# Patient Record
Sex: Male | Born: 1935
Health system: Southern US, Community
[De-identification: ages and names within clinical notes are randomized; demographics above are authoritative.]

## PROBLEM LIST (undated history)

## (undated) DIAGNOSIS — I10 Essential (primary) hypertension: Secondary | ICD-10-CM

## (undated) DIAGNOSIS — Z950 Presence of cardiac pacemaker: Secondary | ICD-10-CM

## (undated) DIAGNOSIS — G4730 Sleep apnea, unspecified: Secondary | ICD-10-CM

## (undated) DIAGNOSIS — M1990 Unspecified osteoarthritis, unspecified site: Secondary | ICD-10-CM

## (undated) DIAGNOSIS — Z22322 Carrier or suspected carrier of Methicillin resistant Staphylococcus aureus: Secondary | ICD-10-CM

## (undated) DIAGNOSIS — R251 Tremor, unspecified: Secondary | ICD-10-CM

## (undated) DIAGNOSIS — J449 Chronic obstructive pulmonary disease, unspecified: Secondary | ICD-10-CM

## (undated) DIAGNOSIS — I495 Sick sinus syndrome: Secondary | ICD-10-CM

## (undated) DIAGNOSIS — C801 Malignant (primary) neoplasm, unspecified: Secondary | ICD-10-CM

## (undated) DIAGNOSIS — R490 Dysphonia: Secondary | ICD-10-CM

## (undated) HISTORY — PX: INSERT / REPLACE / REMOVE PACEMAKER: SUR710

## (undated) HISTORY — PX: CATARACT EXTRACTION: SUR2

## (undated) HISTORY — PX: POSTERIOR LAMINECTOMY / DECOMPRESSION LUMBAR SPINE: SUR740

---

## 2004-01-16 ENCOUNTER — Other Ambulatory Visit: Payer: Self-pay

## 2004-07-28 ENCOUNTER — Ambulatory Visit: Payer: Self-pay | Admitting: Physician Assistant

## 2004-08-13 ENCOUNTER — Other Ambulatory Visit: Payer: Self-pay

## 2004-08-13 ENCOUNTER — Observation Stay: Payer: Self-pay | Admitting: Unknown Physician Specialty

## 2004-09-16 ENCOUNTER — Ambulatory Visit: Payer: Self-pay

## 2005-11-13 ENCOUNTER — Ambulatory Visit: Payer: Self-pay | Admitting: Internal Medicine

## 2006-01-07 ENCOUNTER — Ambulatory Visit: Payer: Self-pay | Admitting: Internal Medicine

## 2008-04-07 ENCOUNTER — Inpatient Hospital Stay: Payer: Self-pay | Admitting: Internal Medicine

## 2008-05-15 ENCOUNTER — Encounter: Payer: Self-pay | Admitting: Orthopedic Surgery

## 2008-05-15 ENCOUNTER — Ambulatory Visit: Payer: Self-pay | Admitting: Internal Medicine

## 2008-05-22 ENCOUNTER — Encounter: Payer: Self-pay | Admitting: Orthopedic Surgery

## 2008-06-22 ENCOUNTER — Encounter: Payer: Self-pay | Admitting: Orthopedic Surgery

## 2008-06-25 ENCOUNTER — Ambulatory Visit: Payer: Self-pay | Admitting: Orthopedic Surgery

## 2008-06-28 ENCOUNTER — Ambulatory Visit: Payer: Self-pay | Admitting: Sports Medicine

## 2008-06-28 DIAGNOSIS — M7500 Adhesive capsulitis of unspecified shoulder: Secondary | ICD-10-CM | POA: Insufficient documentation

## 2008-06-28 DIAGNOSIS — M67919 Unspecified disorder of synovium and tendon, unspecified shoulder: Secondary | ICD-10-CM | POA: Insufficient documentation

## 2008-06-28 DIAGNOSIS — M719 Bursopathy, unspecified: Secondary | ICD-10-CM

## 2008-06-29 ENCOUNTER — Encounter (INDEPENDENT_AMBULATORY_CARE_PROVIDER_SITE_OTHER): Payer: Self-pay | Admitting: *Deleted

## 2008-07-10 ENCOUNTER — Ambulatory Visit: Payer: Self-pay | Admitting: Orthopedic Surgery

## 2008-07-23 ENCOUNTER — Encounter: Payer: Self-pay | Admitting: Orthopedic Surgery

## 2008-08-15 ENCOUNTER — Ambulatory Visit: Payer: Self-pay | Admitting: Pain Medicine

## 2008-08-20 ENCOUNTER — Encounter: Payer: Self-pay | Admitting: Orthopedic Surgery

## 2009-02-08 ENCOUNTER — Inpatient Hospital Stay: Payer: Self-pay | Admitting: Internal Medicine

## 2009-03-07 ENCOUNTER — Ambulatory Visit: Payer: Self-pay | Admitting: Unknown Physician Specialty

## 2009-03-15 ENCOUNTER — Ambulatory Visit: Payer: Self-pay | Admitting: Unknown Physician Specialty

## 2010-04-03 ENCOUNTER — Ambulatory Visit: Payer: Self-pay

## 2011-02-21 DEATH — deceased

## 2011-11-10 ENCOUNTER — Ambulatory Visit: Payer: Self-pay | Admitting: Internal Medicine

## 2012-05-18 ENCOUNTER — Ambulatory Visit: Payer: Self-pay | Admitting: Internal Medicine

## 2013-04-10 ENCOUNTER — Ambulatory Visit: Payer: Self-pay | Admitting: Internal Medicine

## 2013-10-01 DIAGNOSIS — G4733 Obstructive sleep apnea (adult) (pediatric): Secondary | ICD-10-CM | POA: Insufficient documentation

## 2013-11-06 DIAGNOSIS — I2510 Atherosclerotic heart disease of native coronary artery without angina pectoris: Secondary | ICD-10-CM | POA: Insufficient documentation

## 2013-11-06 DIAGNOSIS — I10 Essential (primary) hypertension: Secondary | ICD-10-CM | POA: Diagnosis present

## 2013-11-09 ENCOUNTER — Ambulatory Visit: Payer: Self-pay | Admitting: Internal Medicine

## 2013-11-23 ENCOUNTER — Ambulatory Visit: Payer: Self-pay | Admitting: Internal Medicine

## 2014-01-22 DIAGNOSIS — E782 Mixed hyperlipidemia: Secondary | ICD-10-CM | POA: Insufficient documentation

## 2014-02-08 DIAGNOSIS — G250 Essential tremor: Secondary | ICD-10-CM | POA: Diagnosis present

## 2014-06-19 ENCOUNTER — Ambulatory Visit: Payer: Self-pay | Admitting: Internal Medicine

## 2015-05-02 DIAGNOSIS — N1832 Chronic kidney disease, stage 3b: Secondary | ICD-10-CM | POA: Diagnosis present

## 2015-05-02 DIAGNOSIS — N183 Chronic kidney disease, stage 3 unspecified: Secondary | ICD-10-CM | POA: Diagnosis present

## 2015-07-25 DIAGNOSIS — X32XXXA Exposure to sunlight, initial encounter: Secondary | ICD-10-CM | POA: Diagnosis not present

## 2015-07-25 DIAGNOSIS — D0421 Carcinoma in situ of skin of right ear and external auricular canal: Secondary | ICD-10-CM | POA: Diagnosis not present

## 2015-07-25 DIAGNOSIS — D485 Neoplasm of uncertain behavior of skin: Secondary | ICD-10-CM | POA: Diagnosis not present

## 2015-07-25 DIAGNOSIS — Z85828 Personal history of other malignant neoplasm of skin: Secondary | ICD-10-CM | POA: Diagnosis not present

## 2015-07-25 DIAGNOSIS — D2261 Melanocytic nevi of right upper limb, including shoulder: Secondary | ICD-10-CM | POA: Diagnosis not present

## 2015-07-25 DIAGNOSIS — L821 Other seborrheic keratosis: Secondary | ICD-10-CM | POA: Diagnosis not present

## 2015-07-25 DIAGNOSIS — D225 Melanocytic nevi of trunk: Secondary | ICD-10-CM | POA: Diagnosis not present

## 2015-07-25 DIAGNOSIS — L570 Actinic keratosis: Secondary | ICD-10-CM | POA: Diagnosis not present

## 2015-08-22 DIAGNOSIS — D0439 Carcinoma in situ of skin of other parts of face: Secondary | ICD-10-CM | POA: Diagnosis not present

## 2015-10-11 DIAGNOSIS — M5010 Cervical disc disorder with radiculopathy, unspecified cervical region: Secondary | ICD-10-CM | POA: Diagnosis not present

## 2015-10-23 DIAGNOSIS — I2510 Atherosclerotic heart disease of native coronary artery without angina pectoris: Secondary | ICD-10-CM | POA: Diagnosis not present

## 2015-10-23 DIAGNOSIS — Z125 Encounter for screening for malignant neoplasm of prostate: Secondary | ICD-10-CM | POA: Diagnosis not present

## 2015-10-30 DIAGNOSIS — I495 Sick sinus syndrome: Secondary | ICD-10-CM | POA: Diagnosis not present

## 2015-10-30 DIAGNOSIS — J410 Simple chronic bronchitis: Secondary | ICD-10-CM | POA: Diagnosis not present

## 2015-10-30 DIAGNOSIS — I10 Essential (primary) hypertension: Secondary | ICD-10-CM | POA: Diagnosis not present

## 2015-10-30 DIAGNOSIS — J42 Unspecified chronic bronchitis: Secondary | ICD-10-CM | POA: Diagnosis not present

## 2015-10-30 DIAGNOSIS — Z0000 Encounter for general adult medical examination without abnormal findings: Secondary | ICD-10-CM | POA: Diagnosis not present

## 2015-10-30 DIAGNOSIS — D369 Benign neoplasm, unspecified site: Secondary | ICD-10-CM | POA: Diagnosis not present

## 2015-10-30 DIAGNOSIS — E782 Mixed hyperlipidemia: Secondary | ICD-10-CM | POA: Diagnosis not present

## 2015-10-30 DIAGNOSIS — I2510 Atherosclerotic heart disease of native coronary artery without angina pectoris: Secondary | ICD-10-CM | POA: Diagnosis not present

## 2015-10-30 DIAGNOSIS — N183 Chronic kidney disease, stage 3 (moderate): Secondary | ICD-10-CM | POA: Diagnosis not present

## 2015-11-07 DIAGNOSIS — I495 Sick sinus syndrome: Secondary | ICD-10-CM | POA: Diagnosis not present

## 2015-11-14 DIAGNOSIS — R490 Dysphonia: Secondary | ICD-10-CM | POA: Diagnosis not present

## 2015-11-14 DIAGNOSIS — E870 Hyperosmolality and hypernatremia: Secondary | ICD-10-CM | POA: Diagnosis not present

## 2015-11-14 DIAGNOSIS — K219 Gastro-esophageal reflux disease without esophagitis: Secondary | ICD-10-CM | POA: Diagnosis not present

## 2015-11-14 DIAGNOSIS — Z86010 Personal history of colonic polyps: Secondary | ICD-10-CM | POA: Diagnosis not present

## 2015-12-04 DIAGNOSIS — S30863A Insect bite (nonvenomous) of scrotum and testes, initial encounter: Secondary | ICD-10-CM | POA: Diagnosis not present

## 2015-12-04 DIAGNOSIS — R21 Rash and other nonspecific skin eruption: Secondary | ICD-10-CM | POA: Diagnosis not present

## 2015-12-04 DIAGNOSIS — W57XXXA Bitten or stung by nonvenomous insect and other nonvenomous arthropods, initial encounter: Secondary | ICD-10-CM | POA: Diagnosis not present

## 2016-01-14 ENCOUNTER — Encounter: Payer: Self-pay | Admitting: *Deleted

## 2016-01-15 ENCOUNTER — Ambulatory Visit: Admission: RE | Admit: 2016-01-15 | Payer: Self-pay | Source: Ambulatory Visit | Admitting: Unknown Physician Specialty

## 2016-01-15 ENCOUNTER — Encounter: Payer: Self-pay | Admitting: *Deleted

## 2016-01-15 ENCOUNTER — Ambulatory Visit: Payer: PPO | Admitting: Anesthesiology

## 2016-01-15 ENCOUNTER — Ambulatory Visit
Admission: RE | Admit: 2016-01-15 | Discharge: 2016-01-15 | Disposition: A | Discharge status: Home / Self Care | Payer: PPO | Source: Ambulatory Visit | Attending: Unknown Physician Specialty | Admitting: Unknown Physician Specialty

## 2016-01-15 ENCOUNTER — Encounter: Admission: RE | Payer: Self-pay | Source: Ambulatory Visit

## 2016-01-15 ENCOUNTER — Encounter
Admission: RE | Disposition: A | Discharge status: Home / Self Care | Payer: Self-pay | Source: Ambulatory Visit | Attending: Unknown Physician Specialty

## 2016-01-15 DIAGNOSIS — G4730 Sleep apnea, unspecified: Secondary | ICD-10-CM | POA: Diagnosis not present

## 2016-01-15 DIAGNOSIS — K5730 Diverticulosis of large intestine without perforation or abscess without bleeding: Secondary | ICD-10-CM | POA: Diagnosis not present

## 2016-01-15 DIAGNOSIS — Z1211 Encounter for screening for malignant neoplasm of colon: Secondary | ICD-10-CM | POA: Diagnosis not present

## 2016-01-15 DIAGNOSIS — R12 Heartburn: Secondary | ICD-10-CM | POA: Diagnosis not present

## 2016-01-15 DIAGNOSIS — J449 Chronic obstructive pulmonary disease, unspecified: Secondary | ICD-10-CM | POA: Diagnosis not present

## 2016-01-15 DIAGNOSIS — I10 Essential (primary) hypertension: Secondary | ICD-10-CM | POA: Insufficient documentation

## 2016-01-15 DIAGNOSIS — Z8614 Personal history of Methicillin resistant Staphylococcus aureus infection: Secondary | ICD-10-CM | POA: Diagnosis not present

## 2016-01-15 DIAGNOSIS — K3189 Other diseases of stomach and duodenum: Secondary | ICD-10-CM | POA: Diagnosis not present

## 2016-01-15 DIAGNOSIS — M1990 Unspecified osteoarthritis, unspecified site: Secondary | ICD-10-CM | POA: Diagnosis not present

## 2016-01-15 DIAGNOSIS — K229 Disease of esophagus, unspecified: Secondary | ICD-10-CM | POA: Diagnosis not present

## 2016-01-15 DIAGNOSIS — D123 Benign neoplasm of transverse colon: Secondary | ICD-10-CM | POA: Diagnosis not present

## 2016-01-15 DIAGNOSIS — Z86010 Personal history of colonic polyps: Secondary | ICD-10-CM | POA: Diagnosis not present

## 2016-01-15 DIAGNOSIS — Z79899 Other long term (current) drug therapy: Secondary | ICD-10-CM | POA: Insufficient documentation

## 2016-01-15 DIAGNOSIS — K317 Polyp of stomach and duodenum: Secondary | ICD-10-CM | POA: Diagnosis not present

## 2016-01-15 DIAGNOSIS — Z7982 Long term (current) use of aspirin: Secondary | ICD-10-CM | POA: Insufficient documentation

## 2016-01-15 DIAGNOSIS — D124 Benign neoplasm of descending colon: Secondary | ICD-10-CM | POA: Insufficient documentation

## 2016-01-15 DIAGNOSIS — K210 Gastro-esophageal reflux disease with esophagitis: Secondary | ICD-10-CM | POA: Diagnosis not present

## 2016-01-15 DIAGNOSIS — R490 Dysphonia: Secondary | ICD-10-CM | POA: Diagnosis not present

## 2016-01-15 DIAGNOSIS — K5790 Diverticulosis of intestine, part unspecified, without perforation or abscess without bleeding: Secondary | ICD-10-CM | POA: Diagnosis not present

## 2016-01-15 DIAGNOSIS — K635 Polyp of colon: Secondary | ICD-10-CM | POA: Diagnosis not present

## 2016-01-15 DIAGNOSIS — K621 Rectal polyp: Secondary | ICD-10-CM | POA: Diagnosis not present

## 2016-01-15 DIAGNOSIS — K640 First degree hemorrhoids: Secondary | ICD-10-CM | POA: Insufficient documentation

## 2016-01-15 DIAGNOSIS — Z87891 Personal history of nicotine dependence: Secondary | ICD-10-CM | POA: Diagnosis not present

## 2016-01-15 DIAGNOSIS — K648 Other hemorrhoids: Secondary | ICD-10-CM | POA: Diagnosis not present

## 2016-01-15 DIAGNOSIS — K209 Esophagitis, unspecified: Secondary | ICD-10-CM | POA: Diagnosis not present

## 2016-01-15 DIAGNOSIS — K2950 Unspecified chronic gastritis without bleeding: Secondary | ICD-10-CM | POA: Diagnosis not present

## 2016-01-15 HISTORY — DX: Sick sinus syndrome: I49.5

## 2016-01-15 HISTORY — DX: Chronic obstructive pulmonary disease, unspecified: J44.9

## 2016-01-15 HISTORY — DX: Essential (primary) hypertension: I10

## 2016-01-15 HISTORY — DX: Sleep apnea, unspecified: G47.30

## 2016-01-15 HISTORY — DX: Dysphonia: R49.0

## 2016-01-15 HISTORY — DX: Carrier or suspected carrier of methicillin resistant Staphylococcus aureus: Z22.322

## 2016-01-15 HISTORY — PX: ESOPHAGOGASTRODUODENOSCOPY (EGD) WITH PROPOFOL: SHX5813

## 2016-01-15 HISTORY — PX: COLONOSCOPY WITH PROPOFOL: SHX5780

## 2016-01-15 HISTORY — DX: Unspecified osteoarthritis, unspecified site: M19.90

## 2016-01-15 HISTORY — DX: Tremor, unspecified: R25.1

## 2016-01-15 SURGERY — COLONOSCOPY
Anesthesia: General

## 2016-01-15 SURGERY — COLONOSCOPY WITH PROPOFOL
Anesthesia: General

## 2016-01-15 MED ORDER — FENTANYL CITRATE (PF) 100 MCG/2ML IJ SOLN
INTRAMUSCULAR | Status: DC | PRN
  Administered 2016-01-15: 50 ug via INTRAVENOUS

## 2016-01-15 MED ORDER — LIDOCAINE HCL (PF) 1 % IJ SOLN
INTRAMUSCULAR | Status: AC
  Administered 2016-01-15: 0.03 mL via INTRADERMAL
  Filled 2016-01-15: qty 2

## 2016-01-15 MED ORDER — SODIUM CHLORIDE 0.9 % IV SOLN
INTRAVENOUS | Status: DC
  Administered 2016-01-15: 1000 mL via INTRAVENOUS

## 2016-01-15 MED ORDER — PROPOFOL 10 MG/ML IV BOLUS
INTRAVENOUS | Status: DC | PRN
  Administered 2016-01-15: 30 mg via INTRAVENOUS
  Administered 2016-01-15: 20 mg via INTRAVENOUS

## 2016-01-15 MED ORDER — GLYCOPYRROLATE 0.2 MG/ML IJ SOLN
INTRAMUSCULAR | Status: DC | PRN
  Administered 2016-01-15: 0.2 mg via INTRAVENOUS

## 2016-01-15 MED ORDER — PROPOFOL 500 MG/50ML IV EMUL
INTRAVENOUS | Status: DC | PRN
  Administered 2016-01-15: 100 ug/kg/min via INTRAVENOUS

## 2016-01-15 MED ORDER — LIDOCAINE HCL (PF) 2 % IJ SOLN
INTRAMUSCULAR | Status: DC | PRN
  Administered 2016-01-15: 50 mg

## 2016-01-15 MED ORDER — LIDOCAINE HCL (PF) 1 % IJ SOLN
2.0000 mL | Freq: Once | INTRAMUSCULAR | Status: AC
  Administered 2016-01-15: 0.03 mL via INTRADERMAL

## 2016-01-15 NOTE — Anesthesia Preprocedure Evaluation (Addendum)
Anesthesia Evaluation  Patient identified by MRN, date of birth, ID band Patient awake    Reviewed: Allergy & Precautions, NPO status , Patient's Chart, lab work & pertinent test results  History of Anesthesia Complications Negative for: history of anesthetic complications  Airway Mallampati: II  TM Distance: >3 FB Neck ROM: Full    Dental  (+) Lower Dentures, Upper Dentures   Pulmonary sleep apnea (does not use CPAP) , COPD, former smoker,    - rhonchi (-) wheezing      Cardiovascular hypertension, Pt. on medications (-) CAD and (-) Past MI + pacemaker  Rhythm:Regular Rate:Normal - Systolic murmurs and - Diastolic murmurs    Neuro/Psych negative psych ROS   GI/Hepatic negative GI ROS, Neg liver ROS,   Endo/Other  negative endocrine ROSneg diabetes  Renal/GU negative Renal ROS     Musculoskeletal  (+) Arthritis , Osteoarthritis,    Abdominal (+) - obese,   Peds  Hematology negative hematology ROS (+)   Anesthesia Other Findings Past Medical History: No date: Arthritis No date: COPD (chronic obstructive pulmonary disease) (* No date: Hoarseness of voice No date: Hypertension No date: MRSA (methicillin resistant staph aureus) cult* No date: Sick sinus syndrome (HCC) No date: Sleep apnea No date: Tremor   Reproductive/Obstetrics                            Anesthesia Physical Anesthesia Plan  ASA: III  Anesthesia Plan: General   Post-op Pain Management:    Induction: Intravenous  Airway Management Planned: Natural Airway  Additional Equipment:   Intra-op Plan:   Post-operative Plan:   Informed Consent: I have reviewed the patients History and Physical, chart, labs and discussed the procedure including the risks, benefits and alternatives for the proposed anesthesia with the patient or authorized representative who has indicated his/her understanding and acceptance.      Plan Discussed with: CRNA and Anesthesiologist  Anesthesia Plan Comments:        Anesthesia Quick Evaluation

## 2016-01-15 NOTE — Transfer of Care (Signed)
Immediate Anesthesia Transfer of Care Note  Patient: Darren Gonzales  Procedure(s) Performed: Procedure(s): COLONOSCOPY WITH PROPOFOL (N/A) ESOPHAGOGASTRODUODENOSCOPY (EGD) WITH PROPOFOL (N/A)  Patient Location: PACU  Anesthesia Type:General  Level of Consciousness: sedated  Airway & Oxygen Therapy: Patient Spontanous Breathing and Patient connected to nasal cannula oxygen  Post-op Assessment: Report given to RN and Post -op Vital signs reviewed and stable  Post vital signs: Reviewed and stable  Last Vitals:  Vitals:   01/15/16 0842  BP: (!) 145/99  Pulse: 82  Resp: 17  Temp: 36.2 C    Last Pain:  Vitals:   01/15/16 0842  TempSrc: Tympanic         Complications: No apparent anesthesia complications

## 2016-01-15 NOTE — Op Note (Signed)
[facility] Gastroenterology Patient Name: Darren Gonzales Procedure Date: 01/15/2016 10:14 AM MRN: [id number] Account #: [id number] Date of Birth: 1936-05-02 Admit Type: Outpatient Age: 80 Room: [facility] ENDO ROOM 4 Gender: Male Note Status: Finalized Procedure:            Upper GI endoscopy Indications:          Heartburn, Suspected esophageal reflux Providers:            Manya Silvas, MD Referring MD:         Rusty Aus, MD (Referring MD) Medicines:            Propofol per Anesthesia Complications:        No immediate complications. Procedure:            Pre-Anesthesia Assessment:                       - After reviewing the risks and benefits, the patient                        was deemed in satisfactory condition to undergo the                        procedure.                       After obtaining informed consent, the endoscope was                        passed under direct vision. Throughout the procedure,                        the patient's blood pressure, pulse, and oxygen                        saturations were monitored continuously. The                        Colonoscope was introduced through the mouth, and                        advanced to the second part of duodenum. The upper GI                        endoscopy was accomplished without difficulty. The                        patient tolerated the procedure well. Findings:      LA Grade C (one or more mucosal breaks continuous between tops of 2 or       more mucosal folds, less than 75% circumference) esophagitis with no       bleeding was found 40 cm from the incisors. Biopsies were taken with a       cold forceps for histology. Polyp like issue biopsied there also.      The stomach was normal.      The examined duodenum was normal. Impression:           - LA Grade C reflux esophagitis. Rule out Barrett's                        esophagus. Biopsied.                       -  Normal stomach.                       - Normal examined duodenum. Recommendation:       - Await pathology results.                       - Perform a colonoscopy as previously scheduled. Manya Silvas, MD 01/15/2016 10:27:24 AM This report has been signed electronically. Number of Addenda: 0 Note Initiated On: 01/15/2016 10:14 AM      [facility]

## 2016-01-15 NOTE — Op Note (Signed)
[facility] Gastroenterology Patient Name: Darren Gonzales Procedure Date: 01/15/2016 10:13 AM MRN: [id number] Account #: [id number] Date of Birth: 12-Jul-1935 Admit Type: Outpatient Age: 80 Room: [facility] ENDO ROOM 4 Gender: Male Note Status: Finalized Procedure:            Colonoscopy Indications:          High risk colon cancer surveillance: Personal history                        of colonic polyps Providers:            Manya Silvas, MD Referring MD:         Rusty Aus, MD (Referring MD) Medicines:            Propofol per Anesthesia Complications:        No immediate complications. Procedure:            Pre-Anesthesia Assessment:                       - After reviewing the risks and benefits, the patient                        was deemed in satisfactory condition to undergo the                        procedure.                       After obtaining informed consent, the colonoscope was                        passed under direct vision. Throughout the procedure,                        the patient's blood pressure, pulse, and oxygen                        saturations were monitored continuously. The                        Colonoscope was introduced through the anus and                        advanced to the the cecum, identified by appendiceal                        orifice and ileocecal valve. The colonoscopy was                        performed without difficulty. The patient tolerated the                        procedure well. The quality of the bowel preparation                        was excellent. Findings:      Four sessile polyps were found in the rectum, descending colon,       transverse colon and hepatic flexure. The polyps were diminutive in       size. These polyps were removed with a cold biopsy forceps. Resection  and retrieval were complete.      A few small-mouthed diverticula were found in the sigmoid colon and       descending  colon.      Internal hemorrhoids were found during endoscopy. The hemorrhoids were       small and Grade I (internal hemorrhoids that do not prolapse).      The exam was otherwise without abnormality. Impression:           - Four diminutive polyps in the rectum, in the                        descending colon, in the transverse colon and at the                        hepatic flexure, removed with a cold biopsy forceps.                        Resected and retrieved.                       - Diverticulosis in the sigmoid colon and in the                        descending colon.                       - Internal hemorrhoids.                       - The examination was otherwise normal. Recommendation:       - Await pathology results. Manya Silvas, MD 01/15/2016 10:45:50 AM This report has been signed electronically. Number of Addenda: 0 Note Initiated On: 01/15/2016 10:13 AM Scope Withdrawal Time: 0 hours 10 minutes 58 seconds  Total Procedure Duration: 0 hours 13 minutes 37 seconds       [facility]

## 2016-01-15 NOTE — Anesthesia Postprocedure Evaluation (Signed)
Anesthesia Post Note  Patient: Darren Gonzales  Procedure(s) Performed: Procedure(s) (LRB): COLONOSCOPY WITH PROPOFOL (N/A) ESOPHAGOGASTRODUODENOSCOPY (EGD) WITH PROPOFOL (N/A)  Patient location during evaluation: PACU Anesthesia Type: General Level of consciousness: awake and alert and oriented Pain management: pain level controlled Vital Signs Assessment: post-procedure vital signs reviewed and stable Respiratory status: spontaneous breathing, nonlabored ventilation and respiratory function stable Cardiovascular status: blood pressure returned to baseline and stable Postop Assessment: no signs of nausea or vomiting Anesthetic complications: no    Last Vitals:  Vitals:   01/15/16 1055 01/15/16 1115  BP: 114/71 (!) 133/92  Pulse:    Resp:    Temp:      Last Pain:  Vitals:   01/15/16 1047  TempSrc: Tympanic                 [name]

## 2016-01-15 NOTE — H&P (Signed)
Primary Care Physician:  Rusty Aus, MD Primary Gastroenterologist:  Dr. Vira Agar  Pre-Procedure History & Physical: HPI:  Darren Gonzales is a 80 y.o. male is here for an endoscopy and colonoscopy.   Past Medical History:  Diagnosis Date  . Arthritis   . COPD (chronic obstructive pulmonary disease) ([place])   . Hoarseness of voice   . Hypertension   . MRSA (methicillin resistant staph aureus) culture positive   . Sick sinus syndrome ([place])   . Sleep apnea   . Tremor     Past Surgical History:  Procedure Laterality Date  . CATARACT EXTRACTION    . INSERT / REPLACE / REMOVE PACEMAKER    . POSTERIOR LAMINECTOMY / DECOMPRESSION LUMBAR SPINE      Prior to Admission medications   Medication Sig Start Date End Date Taking? Authorizing Provider  acetaminophen (TYLENOL) 325 MG tablet Take 650 mg by mouth every 6 (six) hours as needed.   Yes Historical Provider, MD  amLODipine (NORVASC) 5 MG tablet Take 5 mg by mouth daily.   Yes Historical Provider, MD  aspirin 81 MG tablet Take 81 mg by mouth daily.   Yes Historical Provider, MD  Biotin 1 MG CAPS Take by mouth.   Yes Historical Provider, MD  Cholecalciferol 1000 units tablet Take 1,000 Units by mouth daily.   Yes Historical Provider, MD  cyanocobalamin 1000 MCG tablet Take 1,000 mcg by mouth daily.   Yes Historical Provider, MD  omeprazole (PRILOSEC) 20 MG capsule Take 20 mg by mouth daily.   Yes Historical Provider, MD  pravastatin (PRAVACHOL) 20 MG tablet Take 20 mg by mouth daily.   Yes Historical Provider, MD  topiramate (TOPAMAX) 50 MG tablet Take 50 mg by mouth daily.   Yes Historical Provider, MD    Allergies as of 12/18/2015  . (Not on File)    History reviewed. No pertinent family history.  Social History   Social History  . Marital status: Married    Spouse name: N/A  . Number of children: N/A  . Years of education: N/A   Occupational History  . Not on file.   Social History Main Topics  . Smoking  status: Former Research scientist (life sciences)  . Smokeless tobacco: Never Used  . Alcohol use No  . Drug use: No  . Sexual activity: Not on file   Other Topics Concern  . Not on file   Social History Narrative  . No narrative on file    Review of Systems: See HPI, otherwise negative ROS  Physical Exam: BP (!) 145/99   Pulse 82   Temp 97.2 F (36.2 C) (Tympanic)   Resp 17   Ht 5\' 9"  (1.753 m)   Wt 79.8 kg (176 lb)   SpO2 100%   BMI 25.99 kg/m  General:   Alert,  pleasant and cooperative in NAD Head:  Normocephalic and atraumatic. Neck:  Supple; no masses or thyromegaly. Lungs:  Clear throughout to auscultation.    Heart:  Regular rate and rhythm. Abdomen:  Soft, nontender and nondistended. Normal bowel sounds, without guarding, and without rebound.   Neurologic:  Alert and  oriented x4;  grossly normal neurologically.  Impression/Plan: Darren Gonzales is here for an endoscopy and colonoscopy to be performed for [facility] colon polyps and GERD and voice hoarseness  Risks, benefits, limitations, and alternatives regarding  endoscopy and colonoscopy have been reviewed with the patient.  Questions have been answered.  All parties agreeable.   Gaylyn Cheers, MD  01/15/2016, 10:12 AM

## 2016-01-16 ENCOUNTER — Encounter: Payer: Self-pay | Admitting: Unknown Physician Specialty

## 2016-01-16 LAB — SURGICAL PATHOLOGY

## 2016-01-22 DIAGNOSIS — X32XXXA Exposure to sunlight, initial encounter: Secondary | ICD-10-CM | POA: Diagnosis not present

## 2016-01-22 DIAGNOSIS — D485 Neoplasm of uncertain behavior of skin: Secondary | ICD-10-CM | POA: Diagnosis not present

## 2016-01-22 DIAGNOSIS — Z85828 Personal history of other malignant neoplasm of skin: Secondary | ICD-10-CM | POA: Diagnosis not present

## 2016-01-22 DIAGNOSIS — D0462 Carcinoma in situ of skin of left upper limb, including shoulder: Secondary | ICD-10-CM | POA: Diagnosis not present

## 2016-01-22 DIAGNOSIS — Z08 Encounter for follow-up examination after completed treatment for malignant neoplasm: Secondary | ICD-10-CM | POA: Diagnosis not present

## 2016-01-22 DIAGNOSIS — D044 Carcinoma in situ of skin of scalp and neck: Secondary | ICD-10-CM | POA: Diagnosis not present

## 2016-01-22 DIAGNOSIS — D0461 Carcinoma in situ of skin of right upper limb, including shoulder: Secondary | ICD-10-CM | POA: Diagnosis not present

## 2016-01-22 DIAGNOSIS — L570 Actinic keratosis: Secondary | ICD-10-CM | POA: Diagnosis not present

## 2016-02-20 DIAGNOSIS — D044 Carcinoma in situ of skin of scalp and neck: Secondary | ICD-10-CM | POA: Diagnosis not present

## 2016-02-20 DIAGNOSIS — D0461 Carcinoma in situ of skin of right upper limb, including shoulder: Secondary | ICD-10-CM | POA: Diagnosis not present

## 2016-02-20 DIAGNOSIS — D485 Neoplasm of uncertain behavior of skin: Secondary | ICD-10-CM | POA: Diagnosis not present

## 2016-02-20 DIAGNOSIS — D0439 Carcinoma in situ of skin of other parts of face: Secondary | ICD-10-CM | POA: Diagnosis not present

## 2016-02-20 DIAGNOSIS — D0462 Carcinoma in situ of skin of left upper limb, including shoulder: Secondary | ICD-10-CM | POA: Diagnosis not present

## 2016-03-17 DIAGNOSIS — D0439 Carcinoma in situ of skin of other parts of face: Secondary | ICD-10-CM | POA: Diagnosis not present

## 2016-03-17 DIAGNOSIS — C44329 Squamous cell carcinoma of skin of other parts of face: Secondary | ICD-10-CM | POA: Diagnosis not present

## 2016-04-28 DIAGNOSIS — Z0000 Encounter for general adult medical examination without abnormal findings: Secondary | ICD-10-CM | POA: Diagnosis not present

## 2016-04-28 DIAGNOSIS — Z79899 Other long term (current) drug therapy: Secondary | ICD-10-CM | POA: Diagnosis not present

## 2016-05-05 DIAGNOSIS — J431 Panlobular emphysema: Secondary | ICD-10-CM | POA: Diagnosis not present

## 2016-05-05 DIAGNOSIS — G250 Essential tremor: Secondary | ICD-10-CM | POA: Diagnosis not present

## 2016-05-05 DIAGNOSIS — Z23 Encounter for immunization: Secondary | ICD-10-CM | POA: Diagnosis not present

## 2016-05-05 DIAGNOSIS — D369 Benign neoplasm, unspecified site: Secondary | ICD-10-CM | POA: Insufficient documentation

## 2016-05-05 DIAGNOSIS — K2100 Gastro-esophageal reflux disease with esophagitis, without bleeding: Secondary | ICD-10-CM | POA: Insufficient documentation

## 2016-05-05 DIAGNOSIS — M542 Cervicalgia: Secondary | ICD-10-CM | POA: Diagnosis not present

## 2016-05-05 DIAGNOSIS — G4733 Obstructive sleep apnea (adult) (pediatric): Secondary | ICD-10-CM | POA: Diagnosis not present

## 2016-05-12 DIAGNOSIS — J410 Simple chronic bronchitis: Secondary | ICD-10-CM | POA: Diagnosis not present

## 2016-05-12 DIAGNOSIS — E782 Mixed hyperlipidemia: Secondary | ICD-10-CM | POA: Diagnosis not present

## 2016-05-12 DIAGNOSIS — I495 Sick sinus syndrome: Secondary | ICD-10-CM | POA: Diagnosis not present

## 2016-05-12 DIAGNOSIS — I10 Essential (primary) hypertension: Secondary | ICD-10-CM | POA: Diagnosis not present

## 2016-05-12 DIAGNOSIS — G4733 Obstructive sleep apnea (adult) (pediatric): Secondary | ICD-10-CM | POA: Diagnosis not present

## 2016-05-12 DIAGNOSIS — I2510 Atherosclerotic heart disease of native coronary artery without angina pectoris: Secondary | ICD-10-CM | POA: Diagnosis not present

## 2016-05-22 DIAGNOSIS — L821 Other seborrheic keratosis: Secondary | ICD-10-CM | POA: Diagnosis not present

## 2016-05-22 DIAGNOSIS — D485 Neoplasm of uncertain behavior of skin: Secondary | ICD-10-CM | POA: Diagnosis not present

## 2016-05-22 DIAGNOSIS — Z85828 Personal history of other malignant neoplasm of skin: Secondary | ICD-10-CM | POA: Diagnosis not present

## 2016-05-22 DIAGNOSIS — C4441 Basal cell carcinoma of skin of scalp and neck: Secondary | ICD-10-CM | POA: Diagnosis not present

## 2016-06-05 DIAGNOSIS — C44519 Basal cell carcinoma of skin of other part of trunk: Secondary | ICD-10-CM | POA: Diagnosis not present

## 2016-06-30 DIAGNOSIS — Z961 Presence of intraocular lens: Secondary | ICD-10-CM | POA: Diagnosis not present

## 2016-08-14 DIAGNOSIS — M542 Cervicalgia: Secondary | ICD-10-CM | POA: Diagnosis not present

## 2016-08-14 DIAGNOSIS — R42 Dizziness and giddiness: Secondary | ICD-10-CM | POA: Diagnosis not present

## 2016-08-14 DIAGNOSIS — Z8614 Personal history of Methicillin resistant Staphylococcus aureus infection: Secondary | ICD-10-CM | POA: Insufficient documentation

## 2016-08-26 DIAGNOSIS — M5412 Radiculopathy, cervical region: Secondary | ICD-10-CM | POA: Diagnosis not present

## 2016-08-26 DIAGNOSIS — L570 Actinic keratosis: Secondary | ICD-10-CM | POA: Diagnosis not present

## 2016-08-26 DIAGNOSIS — D0439 Carcinoma in situ of skin of other parts of face: Secondary | ICD-10-CM | POA: Diagnosis not present

## 2016-08-26 DIAGNOSIS — X32XXXA Exposure to sunlight, initial encounter: Secondary | ICD-10-CM | POA: Diagnosis not present

## 2016-08-26 DIAGNOSIS — R51 Headache: Secondary | ICD-10-CM | POA: Diagnosis not present

## 2016-08-26 DIAGNOSIS — D485 Neoplasm of uncertain behavior of skin: Secondary | ICD-10-CM | POA: Diagnosis not present

## 2016-09-29 DIAGNOSIS — L570 Actinic keratosis: Secondary | ICD-10-CM | POA: Diagnosis not present

## 2016-09-29 DIAGNOSIS — D0439 Carcinoma in situ of skin of other parts of face: Secondary | ICD-10-CM | POA: Diagnosis not present

## 2016-10-13 DIAGNOSIS — R2689 Other abnormalities of gait and mobility: Secondary | ICD-10-CM | POA: Diagnosis not present

## 2016-10-13 DIAGNOSIS — G250 Essential tremor: Secondary | ICD-10-CM | POA: Diagnosis not present

## 2016-10-13 DIAGNOSIS — M50321 Other cervical disc degeneration at C4-C5 level: Secondary | ICD-10-CM | POA: Diagnosis not present

## 2016-10-13 DIAGNOSIS — M5090 Cervical disc disorder, unspecified, unspecified cervical region: Secondary | ICD-10-CM | POA: Diagnosis not present

## 2016-10-13 DIAGNOSIS — M5481 Occipital neuralgia: Secondary | ICD-10-CM | POA: Diagnosis not present

## 2016-10-20 ENCOUNTER — Other Ambulatory Visit: Payer: Self-pay | Admitting: Neurology

## 2016-10-20 DIAGNOSIS — R2689 Other abnormalities of gait and mobility: Secondary | ICD-10-CM

## 2016-10-27 DIAGNOSIS — J431 Panlobular emphysema: Secondary | ICD-10-CM | POA: Diagnosis not present

## 2016-10-27 DIAGNOSIS — Z79899 Other long term (current) drug therapy: Secondary | ICD-10-CM | POA: Diagnosis not present

## 2016-10-27 DIAGNOSIS — E782 Mixed hyperlipidemia: Secondary | ICD-10-CM | POA: Diagnosis not present

## 2016-10-29 ENCOUNTER — Ambulatory Visit
Admission: RE | Admit: 2016-10-29 | Discharge: 2016-10-29 | Disposition: A | Discharge status: Home / Self Care | Payer: PPO | Source: Ambulatory Visit | Attending: Neurology | Admitting: Neurology

## 2016-10-29 DIAGNOSIS — R2689 Other abnormalities of gait and mobility: Secondary | ICD-10-CM | POA: Diagnosis not present

## 2016-10-29 DIAGNOSIS — R51 Headache: Secondary | ICD-10-CM | POA: Diagnosis not present

## 2016-11-03 DIAGNOSIS — E782 Mixed hyperlipidemia: Secondary | ICD-10-CM | POA: Diagnosis not present

## 2016-11-03 DIAGNOSIS — Z79899 Other long term (current) drug therapy: Secondary | ICD-10-CM | POA: Diagnosis not present

## 2016-11-03 DIAGNOSIS — J431 Panlobular emphysema: Secondary | ICD-10-CM | POA: Diagnosis not present

## 2016-11-03 DIAGNOSIS — Z0000 Encounter for general adult medical examination without abnormal findings: Secondary | ICD-10-CM | POA: Diagnosis not present

## 2016-11-04 DIAGNOSIS — M542 Cervicalgia: Secondary | ICD-10-CM | POA: Diagnosis not present

## 2016-11-04 DIAGNOSIS — M5481 Occipital neuralgia: Secondary | ICD-10-CM | POA: Diagnosis not present

## 2016-11-04 DIAGNOSIS — M791 Myalgia: Secondary | ICD-10-CM | POA: Diagnosis not present

## 2016-11-12 DIAGNOSIS — I10 Essential (primary) hypertension: Secondary | ICD-10-CM | POA: Diagnosis not present

## 2016-11-12 DIAGNOSIS — J449 Chronic obstructive pulmonary disease, unspecified: Secondary | ICD-10-CM | POA: Diagnosis not present

## 2016-11-12 DIAGNOSIS — Z950 Presence of cardiac pacemaker: Secondary | ICD-10-CM | POA: Diagnosis not present

## 2016-11-12 DIAGNOSIS — G4733 Obstructive sleep apnea (adult) (pediatric): Secondary | ICD-10-CM | POA: Diagnosis not present

## 2016-11-12 DIAGNOSIS — I2510 Atherosclerotic heart disease of native coronary artery without angina pectoris: Secondary | ICD-10-CM | POA: Diagnosis not present

## 2016-11-12 DIAGNOSIS — E782 Mixed hyperlipidemia: Secondary | ICD-10-CM | POA: Diagnosis not present

## 2016-11-12 DIAGNOSIS — I495 Sick sinus syndrome: Secondary | ICD-10-CM | POA: Diagnosis not present

## 2016-12-04 DIAGNOSIS — L570 Actinic keratosis: Secondary | ICD-10-CM | POA: Diagnosis not present

## 2016-12-04 DIAGNOSIS — X32XXXA Exposure to sunlight, initial encounter: Secondary | ICD-10-CM | POA: Diagnosis not present

## 2016-12-04 DIAGNOSIS — Z85828 Personal history of other malignant neoplasm of skin: Secondary | ICD-10-CM | POA: Diagnosis not present

## 2016-12-04 DIAGNOSIS — D485 Neoplasm of uncertain behavior of skin: Secondary | ICD-10-CM | POA: Diagnosis not present

## 2016-12-04 DIAGNOSIS — Z08 Encounter for follow-up examination after completed treatment for malignant neoplasm: Secondary | ICD-10-CM | POA: Diagnosis not present

## 2016-12-04 DIAGNOSIS — D044 Carcinoma in situ of skin of scalp and neck: Secondary | ICD-10-CM | POA: Diagnosis not present

## 2016-12-14 DIAGNOSIS — G250 Essential tremor: Secondary | ICD-10-CM | POA: Diagnosis not present

## 2016-12-14 DIAGNOSIS — M5481 Occipital neuralgia: Secondary | ICD-10-CM | POA: Diagnosis not present

## 2016-12-30 DIAGNOSIS — D044 Carcinoma in situ of skin of scalp and neck: Secondary | ICD-10-CM | POA: Diagnosis not present

## 2016-12-30 DIAGNOSIS — L570 Actinic keratosis: Secondary | ICD-10-CM | POA: Diagnosis not present

## 2017-03-17 DIAGNOSIS — D0462 Carcinoma in situ of skin of left upper limb, including shoulder: Secondary | ICD-10-CM | POA: Diagnosis not present

## 2017-03-17 DIAGNOSIS — D485 Neoplasm of uncertain behavior of skin: Secondary | ICD-10-CM | POA: Diagnosis not present

## 2017-03-17 DIAGNOSIS — L570 Actinic keratosis: Secondary | ICD-10-CM | POA: Diagnosis not present

## 2017-03-25 DIAGNOSIS — D0462 Carcinoma in situ of skin of left upper limb, including shoulder: Secondary | ICD-10-CM | POA: Diagnosis not present

## 2017-04-26 DIAGNOSIS — E782 Mixed hyperlipidemia: Secondary | ICD-10-CM | POA: Diagnosis not present

## 2017-04-26 DIAGNOSIS — N183 Chronic kidney disease, stage 3 (moderate): Secondary | ICD-10-CM | POA: Diagnosis not present

## 2017-04-26 DIAGNOSIS — K529 Noninfective gastroenteritis and colitis, unspecified: Secondary | ICD-10-CM | POA: Diagnosis not present

## 2017-04-26 DIAGNOSIS — Z79899 Other long term (current) drug therapy: Secondary | ICD-10-CM | POA: Diagnosis not present

## 2017-04-26 DIAGNOSIS — Z125 Encounter for screening for malignant neoplasm of prostate: Secondary | ICD-10-CM | POA: Diagnosis not present

## 2017-04-26 DIAGNOSIS — Z23 Encounter for immunization: Secondary | ICD-10-CM | POA: Diagnosis not present

## 2017-05-20 DIAGNOSIS — I2510 Atherosclerotic heart disease of native coronary artery without angina pectoris: Secondary | ICD-10-CM | POA: Diagnosis not present

## 2017-05-20 DIAGNOSIS — R0602 Shortness of breath: Secondary | ICD-10-CM | POA: Diagnosis not present

## 2017-05-20 DIAGNOSIS — G4733 Obstructive sleep apnea (adult) (pediatric): Secondary | ICD-10-CM | POA: Diagnosis not present

## 2017-05-20 DIAGNOSIS — I361 Nonrheumatic tricuspid (valve) insufficiency: Secondary | ICD-10-CM | POA: Diagnosis not present

## 2017-05-20 DIAGNOSIS — E782 Mixed hyperlipidemia: Secondary | ICD-10-CM | POA: Diagnosis not present

## 2017-05-20 DIAGNOSIS — J449 Chronic obstructive pulmonary disease, unspecified: Secondary | ICD-10-CM | POA: Diagnosis not present

## 2017-05-20 DIAGNOSIS — Z950 Presence of cardiac pacemaker: Secondary | ICD-10-CM | POA: Diagnosis not present

## 2017-05-20 DIAGNOSIS — I10 Essential (primary) hypertension: Secondary | ICD-10-CM | POA: Diagnosis not present

## 2017-05-24 ENCOUNTER — Ambulatory Visit
Admission: RE | Admit: 2017-05-24 | Discharge: 2017-05-24 | Disposition: A | Discharge status: Home / Self Care | Payer: PPO | Source: Ambulatory Visit | Attending: Internal Medicine | Admitting: Internal Medicine

## 2017-05-24 ENCOUNTER — Other Ambulatory Visit: Payer: Self-pay | Admitting: Internal Medicine

## 2017-05-24 DIAGNOSIS — R1084 Generalized abdominal pain: Secondary | ICD-10-CM | POA: Diagnosis not present

## 2017-05-24 DIAGNOSIS — K5730 Diverticulosis of large intestine without perforation or abscess without bleeding: Secondary | ICD-10-CM | POA: Insufficient documentation

## 2017-05-24 DIAGNOSIS — K7689 Other specified diseases of liver: Secondary | ICD-10-CM | POA: Insufficient documentation

## 2017-05-24 DIAGNOSIS — I700 Atherosclerosis of aorta: Secondary | ICD-10-CM | POA: Diagnosis not present

## 2017-05-24 DIAGNOSIS — K5900 Constipation, unspecified: Secondary | ICD-10-CM | POA: Diagnosis not present

## 2017-05-24 DIAGNOSIS — N281 Cyst of kidney, acquired: Secondary | ICD-10-CM | POA: Insufficient documentation

## 2017-05-24 DIAGNOSIS — R109 Unspecified abdominal pain: Secondary | ICD-10-CM | POA: Diagnosis not present

## 2017-05-24 MED ORDER — IOPAMIDOL (ISOVUE-300) INJECTION 61%
75.0000 mL | Freq: Once | INTRAVENOUS | Status: AC | PRN
  Administered 2017-05-24: 75 mL via INTRAVENOUS

## 2017-05-25 ENCOUNTER — Other Ambulatory Visit: Payer: Self-pay | Admitting: Internal Medicine

## 2017-05-25 DIAGNOSIS — I700 Atherosclerosis of aorta: Secondary | ICD-10-CM | POA: Diagnosis present

## 2017-06-01 ENCOUNTER — Other Ambulatory Visit: Payer: Self-pay | Admitting: Internal Medicine

## 2017-06-01 ENCOUNTER — Ambulatory Visit
Admission: RE | Admit: 2017-06-01 | Discharge: 2017-06-01 | Disposition: A | Discharge status: Home / Self Care | Payer: PPO | Source: Ambulatory Visit | Attending: Internal Medicine | Admitting: Internal Medicine

## 2017-06-01 DIAGNOSIS — R0603 Acute respiratory distress: Secondary | ICD-10-CM | POA: Diagnosis not present

## 2017-06-01 DIAGNOSIS — E782 Mixed hyperlipidemia: Secondary | ICD-10-CM | POA: Diagnosis not present

## 2017-06-01 DIAGNOSIS — I10 Essential (primary) hypertension: Secondary | ICD-10-CM | POA: Diagnosis not present

## 2017-06-01 DIAGNOSIS — G4733 Obstructive sleep apnea (adult) (pediatric): Secondary | ICD-10-CM | POA: Diagnosis not present

## 2017-06-01 DIAGNOSIS — J431 Panlobular emphysema: Secondary | ICD-10-CM | POA: Diagnosis not present

## 2017-06-01 DIAGNOSIS — J449 Chronic obstructive pulmonary disease, unspecified: Secondary | ICD-10-CM | POA: Diagnosis not present

## 2017-06-01 DIAGNOSIS — Z539 Procedure and treatment not carried out, unspecified reason: Secondary | ICD-10-CM | POA: Diagnosis not present

## 2017-06-01 DIAGNOSIS — I5022 Chronic systolic (congestive) heart failure: Secondary | ICD-10-CM | POA: Diagnosis not present

## 2017-06-01 DIAGNOSIS — I5032 Chronic diastolic (congestive) heart failure: Secondary | ICD-10-CM | POA: Diagnosis present

## 2017-06-01 DIAGNOSIS — R0602 Shortness of breath: Secondary | ICD-10-CM | POA: Diagnosis not present

## 2017-06-01 DIAGNOSIS — I208 Other forms of angina pectoris: Secondary | ICD-10-CM | POA: Diagnosis not present

## 2017-06-01 DIAGNOSIS — I361 Nonrheumatic tricuspid (valve) insufficiency: Secondary | ICD-10-CM | POA: Diagnosis not present

## 2017-06-01 DIAGNOSIS — I2510 Atherosclerotic heart disease of native coronary artery without angina pectoris: Secondary | ICD-10-CM | POA: Diagnosis not present

## 2017-06-01 LAB — POCT I-STAT CREATININE: Creatinine, Ser: 2 mg/dL — ABNORMAL HIGH (ref 0.61–1.24)

## 2017-06-03 DIAGNOSIS — Z85828 Personal history of other malignant neoplasm of skin: Secondary | ICD-10-CM | POA: Diagnosis not present

## 2017-06-03 DIAGNOSIS — L570 Actinic keratosis: Secondary | ICD-10-CM | POA: Diagnosis not present

## 2017-06-03 DIAGNOSIS — D485 Neoplasm of uncertain behavior of skin: Secondary | ICD-10-CM | POA: Diagnosis not present

## 2017-06-03 DIAGNOSIS — C44319 Basal cell carcinoma of skin of other parts of face: Secondary | ICD-10-CM | POA: Diagnosis not present

## 2017-06-03 DIAGNOSIS — X32XXXA Exposure to sunlight, initial encounter: Secondary | ICD-10-CM | POA: Diagnosis not present

## 2017-06-08 DIAGNOSIS — J431 Panlobular emphysema: Secondary | ICD-10-CM | POA: Diagnosis not present

## 2017-06-08 DIAGNOSIS — I5022 Chronic systolic (congestive) heart failure: Secondary | ICD-10-CM | POA: Diagnosis not present

## 2017-06-08 DIAGNOSIS — N183 Chronic kidney disease, stage 3 (moderate): Secondary | ICD-10-CM | POA: Diagnosis not present

## 2017-06-08 DIAGNOSIS — Z79899 Other long term (current) drug therapy: Secondary | ICD-10-CM | POA: Diagnosis not present

## 2017-06-08 DIAGNOSIS — I2510 Atherosclerotic heart disease of native coronary artery without angina pectoris: Secondary | ICD-10-CM | POA: Diagnosis not present

## 2017-06-08 DIAGNOSIS — E782 Mixed hyperlipidemia: Secondary | ICD-10-CM | POA: Diagnosis not present

## 2017-06-08 DIAGNOSIS — I10 Essential (primary) hypertension: Secondary | ICD-10-CM | POA: Diagnosis not present

## 2017-06-08 DIAGNOSIS — G4733 Obstructive sleep apnea (adult) (pediatric): Secondary | ICD-10-CM | POA: Diagnosis not present

## 2017-06-30 DIAGNOSIS — I2510 Atherosclerotic heart disease of native coronary artery without angina pectoris: Secondary | ICD-10-CM | POA: Diagnosis not present

## 2017-06-30 DIAGNOSIS — E782 Mixed hyperlipidemia: Secondary | ICD-10-CM | POA: Diagnosis not present

## 2017-06-30 DIAGNOSIS — I10 Essential (primary) hypertension: Secondary | ICD-10-CM | POA: Diagnosis not present

## 2017-06-30 DIAGNOSIS — Z950 Presence of cardiac pacemaker: Secondary | ICD-10-CM | POA: Diagnosis not present

## 2017-06-30 DIAGNOSIS — I5022 Chronic systolic (congestive) heart failure: Secondary | ICD-10-CM | POA: Diagnosis not present

## 2017-07-05 DIAGNOSIS — K5900 Constipation, unspecified: Secondary | ICD-10-CM | POA: Diagnosis not present

## 2017-07-05 DIAGNOSIS — R194 Change in bowel habit: Secondary | ICD-10-CM | POA: Diagnosis not present

## 2017-07-05 DIAGNOSIS — R1032 Left lower quadrant pain: Secondary | ICD-10-CM | POA: Diagnosis not present

## 2017-07-08 DIAGNOSIS — I10 Essential (primary) hypertension: Secondary | ICD-10-CM | POA: Diagnosis not present

## 2017-07-08 DIAGNOSIS — Z79899 Other long term (current) drug therapy: Secondary | ICD-10-CM | POA: Diagnosis not present

## 2017-07-08 DIAGNOSIS — E782 Mixed hyperlipidemia: Secondary | ICD-10-CM | POA: Diagnosis not present

## 2017-07-08 DIAGNOSIS — I2510 Atherosclerotic heart disease of native coronary artery without angina pectoris: Secondary | ICD-10-CM | POA: Diagnosis not present

## 2017-07-08 DIAGNOSIS — I5022 Chronic systolic (congestive) heart failure: Secondary | ICD-10-CM | POA: Diagnosis not present

## 2017-07-09 ENCOUNTER — Other Ambulatory Visit
Admission: RE | Admit: 2017-07-09 | Discharge: 2017-07-09 | Disposition: A | Discharge status: Home / Self Care | Payer: PPO | Source: Ambulatory Visit | Attending: Nurse Practitioner | Admitting: Nurse Practitioner

## 2017-07-09 DIAGNOSIS — R197 Diarrhea, unspecified: Secondary | ICD-10-CM | POA: Insufficient documentation

## 2017-07-09 DIAGNOSIS — R1032 Left lower quadrant pain: Secondary | ICD-10-CM | POA: Diagnosis not present

## 2017-07-09 LAB — GASTROINTESTINAL PANEL BY PCR, STOOL (REPLACES STOOL CULTURE)

## 2017-07-14 DIAGNOSIS — I2510 Atherosclerotic heart disease of native coronary artery without angina pectoris: Secondary | ICD-10-CM | POA: Diagnosis not present

## 2017-07-14 DIAGNOSIS — G4733 Obstructive sleep apnea (adult) (pediatric): Secondary | ICD-10-CM | POA: Diagnosis not present

## 2017-07-14 DIAGNOSIS — I5022 Chronic systolic (congestive) heart failure: Secondary | ICD-10-CM | POA: Diagnosis not present

## 2017-07-14 DIAGNOSIS — J431 Panlobular emphysema: Secondary | ICD-10-CM | POA: Diagnosis not present

## 2017-07-14 DIAGNOSIS — I700 Atherosclerosis of aorta: Secondary | ICD-10-CM | POA: Diagnosis not present

## 2017-07-14 DIAGNOSIS — R0602 Shortness of breath: Secondary | ICD-10-CM | POA: Diagnosis not present

## 2017-07-14 DIAGNOSIS — I10 Essential (primary) hypertension: Secondary | ICD-10-CM | POA: Diagnosis not present

## 2017-07-14 DIAGNOSIS — E782 Mixed hyperlipidemia: Secondary | ICD-10-CM | POA: Diagnosis not present

## 2017-07-16 DIAGNOSIS — N183 Chronic kidney disease, stage 3 (moderate): Secondary | ICD-10-CM | POA: Diagnosis not present

## 2017-07-16 DIAGNOSIS — R1032 Left lower quadrant pain: Secondary | ICD-10-CM | POA: Diagnosis not present

## 2017-07-16 DIAGNOSIS — K5730 Diverticulosis of large intestine without perforation or abscess without bleeding: Secondary | ICD-10-CM | POA: Diagnosis not present

## 2017-08-17 DIAGNOSIS — C44319 Basal cell carcinoma of skin of other parts of face: Secondary | ICD-10-CM | POA: Diagnosis not present

## 2017-08-25 DIAGNOSIS — I10 Essential (primary) hypertension: Secondary | ICD-10-CM | POA: Diagnosis not present

## 2017-08-25 DIAGNOSIS — G4733 Obstructive sleep apnea (adult) (pediatric): Secondary | ICD-10-CM | POA: Diagnosis not present

## 2017-08-25 DIAGNOSIS — D696 Thrombocytopenia, unspecified: Secondary | ICD-10-CM | POA: Diagnosis not present

## 2017-08-25 DIAGNOSIS — I5022 Chronic systolic (congestive) heart failure: Secondary | ICD-10-CM | POA: Diagnosis not present

## 2017-08-25 DIAGNOSIS — I2510 Atherosclerotic heart disease of native coronary artery without angina pectoris: Secondary | ICD-10-CM | POA: Diagnosis not present

## 2017-08-25 DIAGNOSIS — E782 Mixed hyperlipidemia: Secondary | ICD-10-CM | POA: Diagnosis not present

## 2017-08-25 DIAGNOSIS — R0602 Shortness of breath: Secondary | ICD-10-CM | POA: Diagnosis not present

## 2017-08-25 DIAGNOSIS — J431 Panlobular emphysema: Secondary | ICD-10-CM | POA: Diagnosis not present

## 2017-08-25 DIAGNOSIS — I700 Atherosclerosis of aorta: Secondary | ICD-10-CM | POA: Diagnosis not present

## 2017-08-25 DIAGNOSIS — N183 Chronic kidney disease, stage 3 (moderate): Secondary | ICD-10-CM | POA: Diagnosis not present

## 2017-10-27 DIAGNOSIS — I495 Sick sinus syndrome: Secondary | ICD-10-CM | POA: Diagnosis not present

## 2017-10-28 DIAGNOSIS — E782 Mixed hyperlipidemia: Secondary | ICD-10-CM | POA: Diagnosis not present

## 2017-10-28 DIAGNOSIS — Z125 Encounter for screening for malignant neoplasm of prostate: Secondary | ICD-10-CM | POA: Diagnosis not present

## 2017-10-28 DIAGNOSIS — Z79899 Other long term (current) drug therapy: Secondary | ICD-10-CM | POA: Diagnosis not present

## 2017-11-04 DIAGNOSIS — R05 Cough: Secondary | ICD-10-CM | POA: Diagnosis not present

## 2017-11-04 DIAGNOSIS — N183 Chronic kidney disease, stage 3 (moderate): Secondary | ICD-10-CM | POA: Diagnosis not present

## 2017-11-04 DIAGNOSIS — I5022 Chronic systolic (congestive) heart failure: Secondary | ICD-10-CM | POA: Diagnosis not present

## 2017-11-04 DIAGNOSIS — R490 Dysphonia: Secondary | ICD-10-CM | POA: Diagnosis not present

## 2017-11-04 DIAGNOSIS — Z0000 Encounter for general adult medical examination without abnormal findings: Secondary | ICD-10-CM | POA: Diagnosis not present

## 2017-11-05 DIAGNOSIS — X32XXXA Exposure to sunlight, initial encounter: Secondary | ICD-10-CM | POA: Diagnosis not present

## 2017-11-05 DIAGNOSIS — Z08 Encounter for follow-up examination after completed treatment for malignant neoplasm: Secondary | ICD-10-CM | POA: Diagnosis not present

## 2017-11-05 DIAGNOSIS — D485 Neoplasm of uncertain behavior of skin: Secondary | ICD-10-CM | POA: Diagnosis not present

## 2017-11-05 DIAGNOSIS — C44319 Basal cell carcinoma of skin of other parts of face: Secondary | ICD-10-CM | POA: Diagnosis not present

## 2017-11-05 DIAGNOSIS — D045 Carcinoma in situ of skin of trunk: Secondary | ICD-10-CM | POA: Diagnosis not present

## 2017-11-05 DIAGNOSIS — L570 Actinic keratosis: Secondary | ICD-10-CM | POA: Diagnosis not present

## 2017-11-05 DIAGNOSIS — Z85828 Personal history of other malignant neoplasm of skin: Secondary | ICD-10-CM | POA: Diagnosis not present

## 2017-11-05 DIAGNOSIS — C44629 Squamous cell carcinoma of skin of left upper limb, including shoulder: Secondary | ICD-10-CM | POA: Diagnosis not present

## 2017-11-17 DIAGNOSIS — C44629 Squamous cell carcinoma of skin of left upper limb, including shoulder: Secondary | ICD-10-CM | POA: Diagnosis not present

## 2017-11-17 DIAGNOSIS — L570 Actinic keratosis: Secondary | ICD-10-CM | POA: Diagnosis not present

## 2017-11-19 DIAGNOSIS — K219 Gastro-esophageal reflux disease without esophagitis: Secondary | ICD-10-CM | POA: Diagnosis not present

## 2017-11-19 DIAGNOSIS — R490 Dysphonia: Secondary | ICD-10-CM | POA: Diagnosis not present

## 2017-11-19 DIAGNOSIS — J301 Allergic rhinitis due to pollen: Secondary | ICD-10-CM | POA: Diagnosis not present

## 2017-12-01 DIAGNOSIS — C44519 Basal cell carcinoma of skin of other part of trunk: Secondary | ICD-10-CM | POA: Diagnosis not present

## 2017-12-01 DIAGNOSIS — D045 Carcinoma in situ of skin of trunk: Secondary | ICD-10-CM | POA: Diagnosis not present

## 2017-12-15 DIAGNOSIS — L570 Actinic keratosis: Secondary | ICD-10-CM | POA: Diagnosis not present

## 2017-12-15 DIAGNOSIS — L244 Irritant contact dermatitis due to drugs in contact with skin: Secondary | ICD-10-CM | POA: Diagnosis not present

## 2017-12-17 DIAGNOSIS — K529 Noninfective gastroenteritis and colitis, unspecified: Secondary | ICD-10-CM | POA: Diagnosis not present

## 2017-12-17 DIAGNOSIS — R490 Dysphonia: Secondary | ICD-10-CM | POA: Diagnosis not present

## 2018-01-03 DIAGNOSIS — G4733 Obstructive sleep apnea (adult) (pediatric): Secondary | ICD-10-CM | POA: Diagnosis not present

## 2018-01-03 DIAGNOSIS — E782 Mixed hyperlipidemia: Secondary | ICD-10-CM | POA: Diagnosis not present

## 2018-01-03 DIAGNOSIS — I2510 Atherosclerotic heart disease of native coronary artery without angina pectoris: Secondary | ICD-10-CM | POA: Diagnosis not present

## 2018-01-03 DIAGNOSIS — I10 Essential (primary) hypertension: Secondary | ICD-10-CM | POA: Diagnosis not present

## 2018-01-03 DIAGNOSIS — N183 Chronic kidney disease, stage 3 (moderate): Secondary | ICD-10-CM | POA: Diagnosis not present

## 2018-01-03 DIAGNOSIS — I700 Atherosclerosis of aorta: Secondary | ICD-10-CM | POA: Diagnosis not present

## 2018-01-03 DIAGNOSIS — I5022 Chronic systolic (congestive) heart failure: Secondary | ICD-10-CM | POA: Diagnosis not present

## 2018-01-03 DIAGNOSIS — J431 Panlobular emphysema: Secondary | ICD-10-CM | POA: Diagnosis not present

## 2018-03-23 DIAGNOSIS — I495 Sick sinus syndrome: Secondary | ICD-10-CM | POA: Diagnosis not present

## 2018-03-28 DIAGNOSIS — N183 Chronic kidney disease, stage 3 (moderate): Secondary | ICD-10-CM | POA: Diagnosis not present

## 2018-03-28 DIAGNOSIS — Z23 Encounter for immunization: Secondary | ICD-10-CM | POA: Diagnosis not present

## 2018-03-28 DIAGNOSIS — M5440 Lumbago with sciatica, unspecified side: Secondary | ICD-10-CM | POA: Diagnosis not present

## 2018-04-06 DIAGNOSIS — Z85828 Personal history of other malignant neoplasm of skin: Secondary | ICD-10-CM | POA: Diagnosis not present

## 2018-04-06 DIAGNOSIS — L570 Actinic keratosis: Secondary | ICD-10-CM | POA: Diagnosis not present

## 2018-04-06 DIAGNOSIS — X32XXXA Exposure to sunlight, initial encounter: Secondary | ICD-10-CM | POA: Diagnosis not present

## 2018-04-06 DIAGNOSIS — Z08 Encounter for follow-up examination after completed treatment for malignant neoplasm: Secondary | ICD-10-CM | POA: Diagnosis not present

## 2018-04-10 IMAGING — CT CT HEAD W/O CM
3 series · 14 of 46 positions shown, 16 images · non-contrast
Comparison: Head CT report 01/17/2004 (no images available).

CLINICAL DATA: 80 y/o male with with bilateral headaches for 6-8
months. Imbalance.

EXAM:
CT HEAD WITHOUT CONTRAST
TECHNIQUE: Contiguous axial images were obtained from the base of the skull
through the vertex without intravenous contrast.

[Series 2: head wo · axial · 0.47mm/px · z∈[+603,+723]mm · 8 of 29 slices shown, 10 images]
[im 3/29  brain]
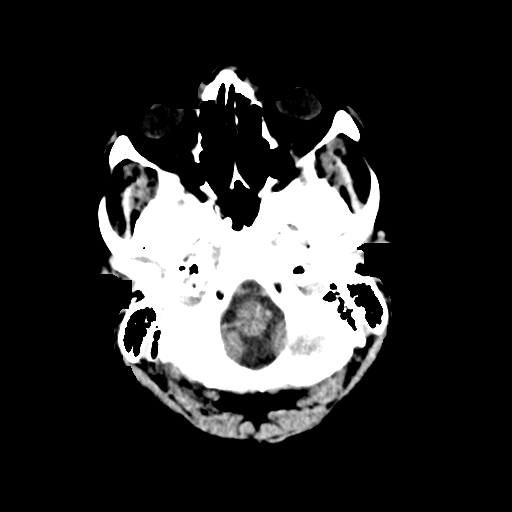
[im 3/29  bone]
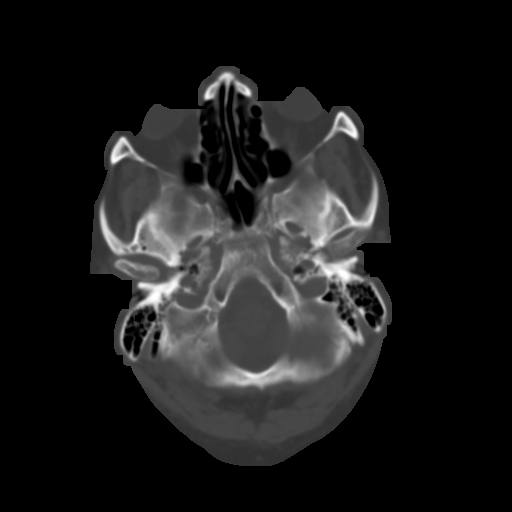
[im 7/29  brain]
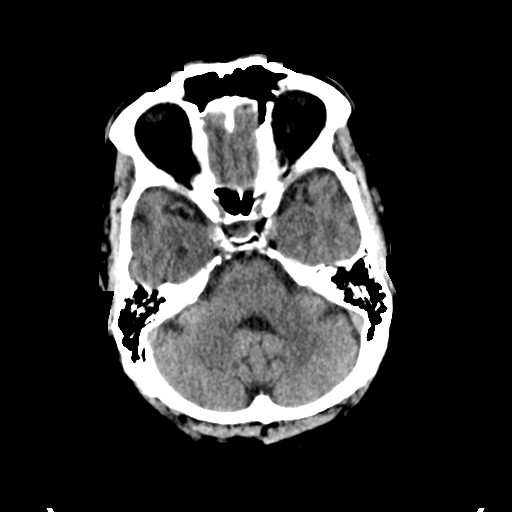
[im 10/29  brain]
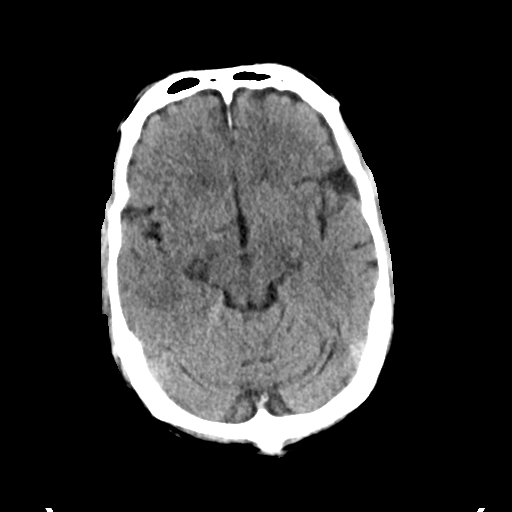
[im 13/29  brain]
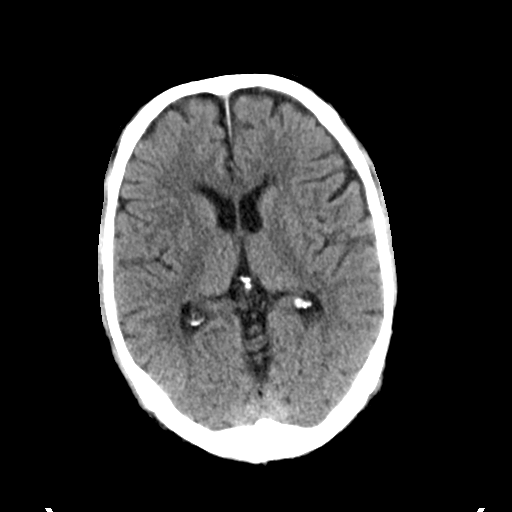
[im 17/29  brain]
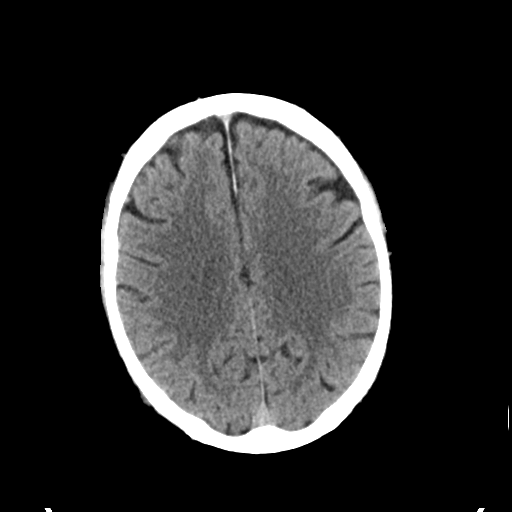
[im 17/29  bone]
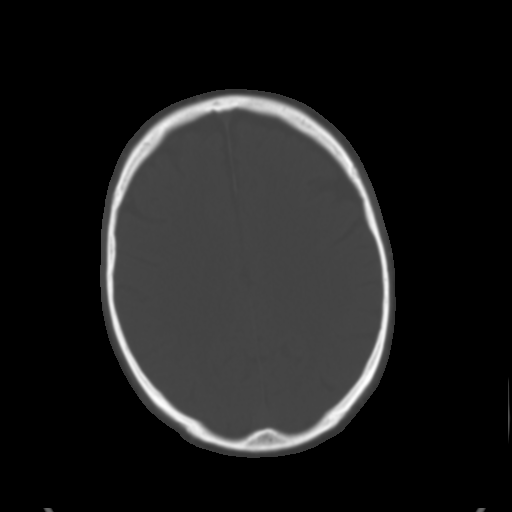
[im 20/29  brain]
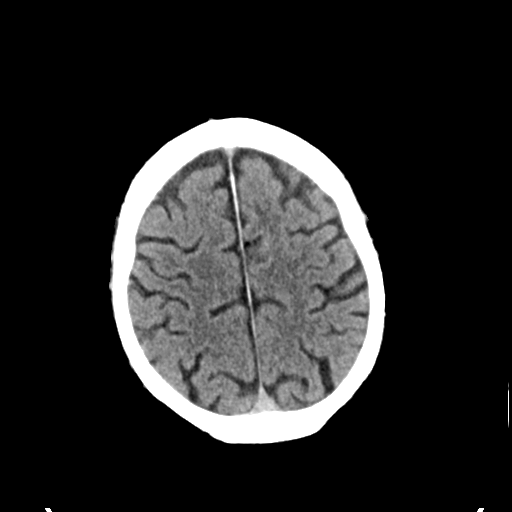
[im 23/29  brain]
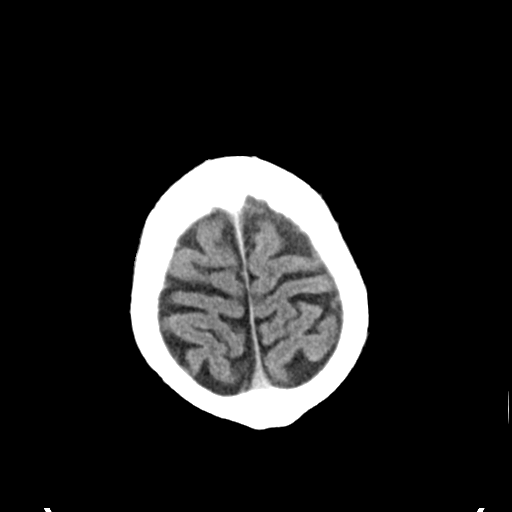
[im 27/29  brain]
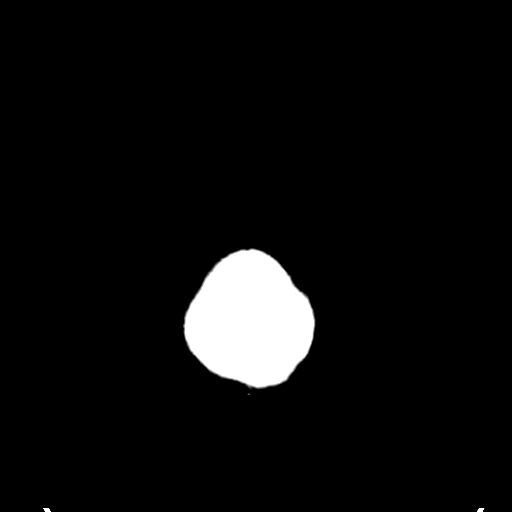

[Series 4: coronal soft tissue · coronal · 0.29mm/px · 3 of 61 slices shown]
[im 21/61  brain]
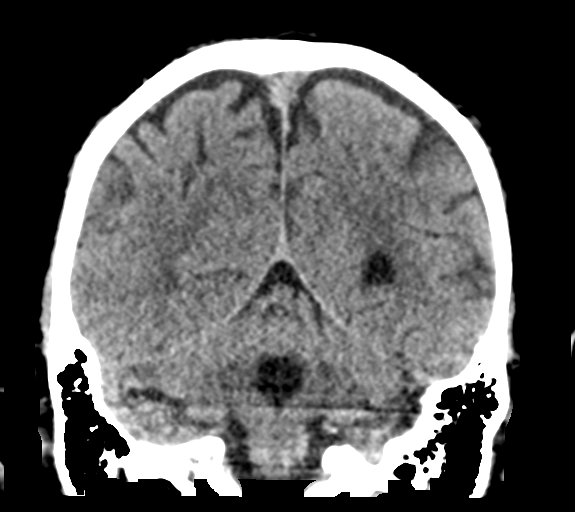
[im 27/61  brain]
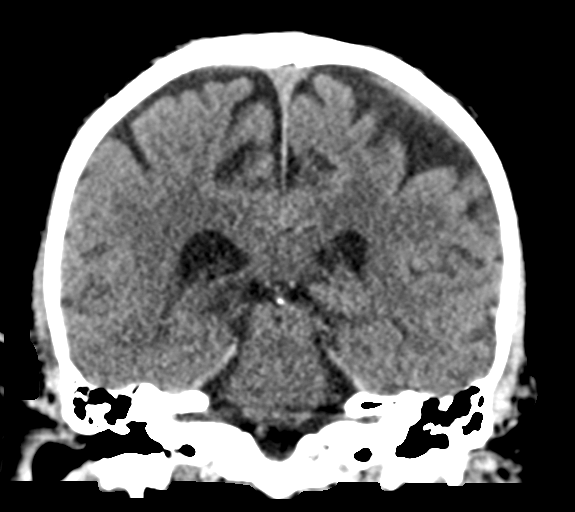
[im 34/61  brain]
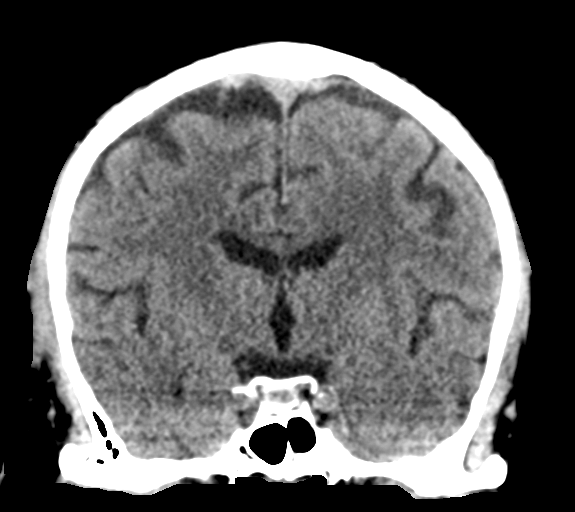

[Series 5: sagittal soft tissue · sagittal · 0.27mm/px · 3 of 50 slices shown]
[im 17/50  brain]
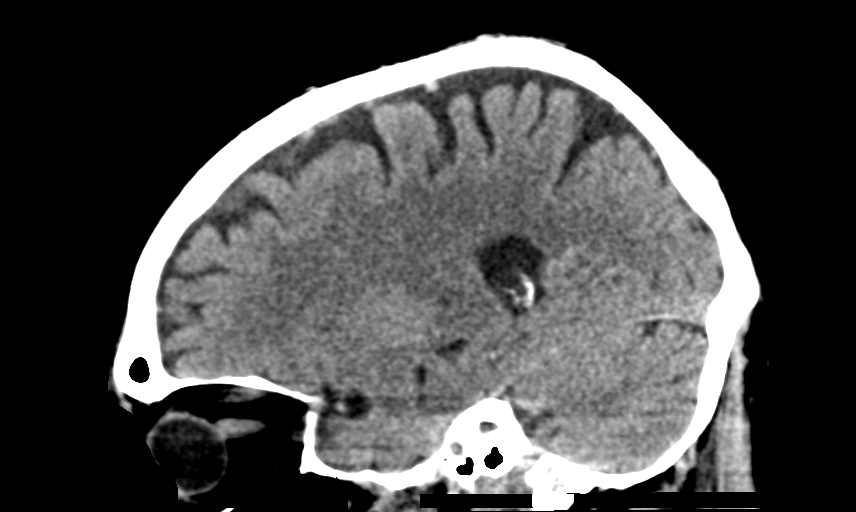
[im 25/50  brain]
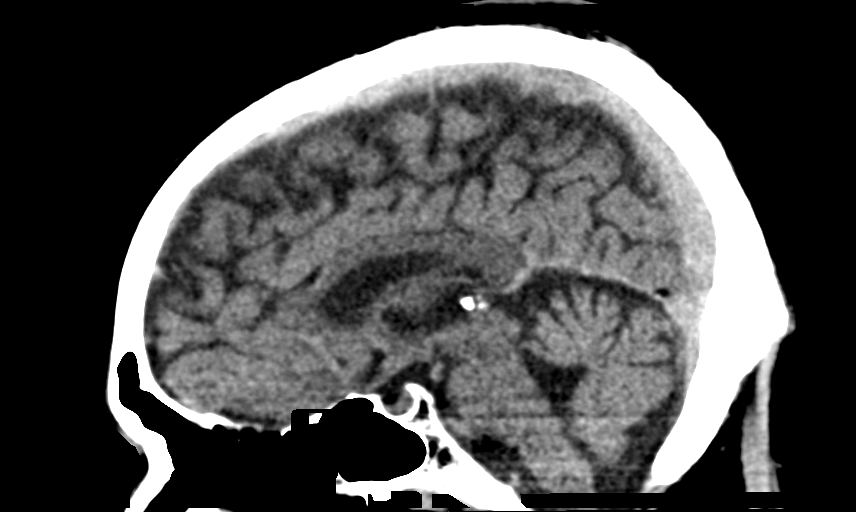
[im 33/50  brain]
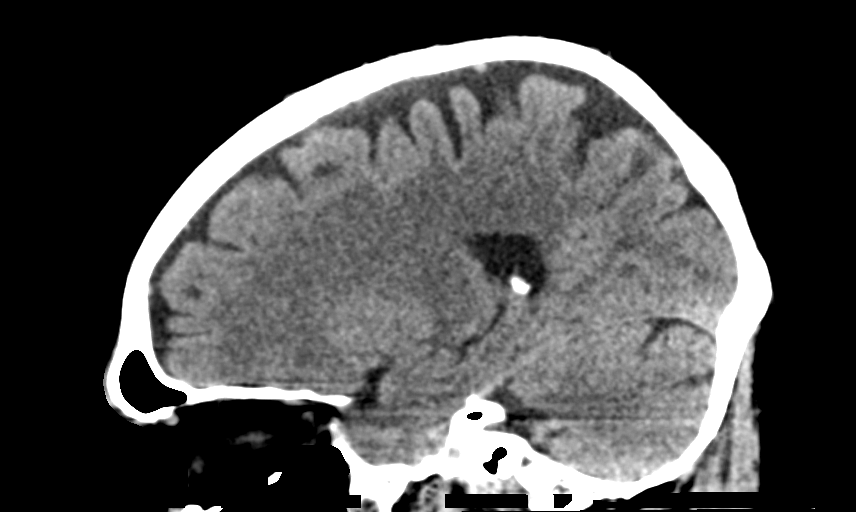

[14 of 46 positions shown; findings below may reference images not displayed]

FINDINGS: Brain: Cerebral volume is within normal limits for age. No midline
shift, ventriculomegaly, mass effect, evidence of mass lesion,
intracranial hemorrhage or evidence of cortically based acute
infarction. Gray-white matter differentiation is within normal
limits throughout the brain. No cortical encephalomalacia.

Vascular: Minimal Calcified atherosclerosis at the skull base. No
suspicious intracranial vascular hyperdensity.

Skull: Negative.  No acute osseous abnormality identified.

Sinuses/Orbits: Clear.

Other: Postoperative changes to the globes. No acute orbit or scalp
soft tissue findings.
IMPRESSION: Normal for age non contrast CT appearance of the brain.

## 2018-04-26 ENCOUNTER — Other Ambulatory Visit: Payer: Self-pay | Admitting: Internal Medicine

## 2018-04-26 DIAGNOSIS — M5116 Intervertebral disc disorders with radiculopathy, lumbar region: Secondary | ICD-10-CM | POA: Diagnosis not present

## 2018-04-26 DIAGNOSIS — M5440 Lumbago with sciatica, unspecified side: Secondary | ICD-10-CM | POA: Diagnosis not present

## 2018-04-26 DIAGNOSIS — G8929 Other chronic pain: Secondary | ICD-10-CM

## 2018-04-26 DIAGNOSIS — M5441 Lumbago with sciatica, right side: Principal | ICD-10-CM

## 2018-04-29 ENCOUNTER — Ambulatory Visit
Admission: RE | Admit: 2018-04-29 | Discharge: 2018-04-29 | Disposition: A | Discharge status: Home / Self Care | Payer: PPO | Source: Ambulatory Visit | Attending: Internal Medicine | Admitting: Internal Medicine

## 2018-04-29 DIAGNOSIS — M545 Low back pain: Secondary | ICD-10-CM | POA: Diagnosis not present

## 2018-04-29 DIAGNOSIS — G8929 Other chronic pain: Secondary | ICD-10-CM

## 2018-04-29 DIAGNOSIS — M48061 Spinal stenosis, lumbar region without neurogenic claudication: Secondary | ICD-10-CM | POA: Insufficient documentation

## 2018-04-29 DIAGNOSIS — Z981 Arthrodesis status: Secondary | ICD-10-CM | POA: Diagnosis not present

## 2018-04-29 DIAGNOSIS — M5440 Lumbago with sciatica, unspecified side: Secondary | ICD-10-CM | POA: Diagnosis present

## 2018-04-29 DIAGNOSIS — M5441 Lumbago with sciatica, right side: Secondary | ICD-10-CM

## 2018-05-02 DIAGNOSIS — E782 Mixed hyperlipidemia: Secondary | ICD-10-CM | POA: Diagnosis not present

## 2018-05-02 DIAGNOSIS — I5022 Chronic systolic (congestive) heart failure: Secondary | ICD-10-CM | POA: Diagnosis not present

## 2018-05-02 DIAGNOSIS — Z79899 Other long term (current) drug therapy: Secondary | ICD-10-CM | POA: Diagnosis not present

## 2018-05-09 DIAGNOSIS — Z125 Encounter for screening for malignant neoplasm of prostate: Secondary | ICD-10-CM | POA: Diagnosis not present

## 2018-05-09 DIAGNOSIS — G4733 Obstructive sleep apnea (adult) (pediatric): Secondary | ICD-10-CM | POA: Diagnosis not present

## 2018-05-09 DIAGNOSIS — M519 Unspecified thoracic, thoracolumbar and lumbosacral intervertebral disc disorder: Secondary | ICD-10-CM | POA: Insufficient documentation

## 2018-05-09 DIAGNOSIS — N183 Chronic kidney disease, stage 3 (moderate): Secondary | ICD-10-CM | POA: Diagnosis not present

## 2018-05-09 DIAGNOSIS — Z950 Presence of cardiac pacemaker: Secondary | ICD-10-CM | POA: Diagnosis not present

## 2018-05-09 DIAGNOSIS — I2510 Atherosclerotic heart disease of native coronary artery without angina pectoris: Secondary | ICD-10-CM | POA: Diagnosis not present

## 2018-05-09 DIAGNOSIS — I5022 Chronic systolic (congestive) heart failure: Secondary | ICD-10-CM | POA: Diagnosis not present

## 2018-05-09 DIAGNOSIS — E782 Mixed hyperlipidemia: Secondary | ICD-10-CM | POA: Diagnosis not present

## 2018-05-09 DIAGNOSIS — M5116 Intervertebral disc disorders with radiculopathy, lumbar region: Secondary | ICD-10-CM | POA: Diagnosis not present

## 2018-05-09 DIAGNOSIS — I10 Essential (primary) hypertension: Secondary | ICD-10-CM | POA: Diagnosis not present

## 2018-05-09 DIAGNOSIS — J431 Panlobular emphysema: Secondary | ICD-10-CM | POA: Diagnosis not present

## 2018-05-18 DIAGNOSIS — I5022 Chronic systolic (congestive) heart failure: Secondary | ICD-10-CM | POA: Diagnosis not present

## 2018-05-31 DIAGNOSIS — I495 Sick sinus syndrome: Secondary | ICD-10-CM | POA: Diagnosis not present

## 2018-09-27 DIAGNOSIS — Z03818 Encounter for observation for suspected exposure to other biological agents ruled out: Secondary | ICD-10-CM | POA: Diagnosis not present

## 2018-09-27 DIAGNOSIS — Z780 Asymptomatic menopausal state: Secondary | ICD-10-CM | POA: Diagnosis not present

## 2018-09-27 DIAGNOSIS — J019 Acute sinusitis, unspecified: Secondary | ICD-10-CM | POA: Diagnosis not present

## 2018-09-27 DIAGNOSIS — I495 Sick sinus syndrome: Secondary | ICD-10-CM | POA: Diagnosis not present

## 2018-09-27 DIAGNOSIS — M810 Age-related osteoporosis without current pathological fracture: Secondary | ICD-10-CM | POA: Diagnosis not present

## 2018-09-27 DIAGNOSIS — I10 Essential (primary) hypertension: Secondary | ICD-10-CM | POA: Diagnosis not present

## 2018-10-31 DIAGNOSIS — Z125 Encounter for screening for malignant neoplasm of prostate: Secondary | ICD-10-CM | POA: Diagnosis not present

## 2018-10-31 DIAGNOSIS — E782 Mixed hyperlipidemia: Secondary | ICD-10-CM | POA: Diagnosis not present

## 2018-11-07 DIAGNOSIS — I700 Atherosclerosis of aorta: Secondary | ICD-10-CM | POA: Diagnosis not present

## 2018-11-07 DIAGNOSIS — E782 Mixed hyperlipidemia: Secondary | ICD-10-CM | POA: Diagnosis not present

## 2018-11-07 DIAGNOSIS — Z0000 Encounter for general adult medical examination without abnormal findings: Secondary | ICD-10-CM | POA: Diagnosis not present

## 2018-11-07 DIAGNOSIS — I5022 Chronic systolic (congestive) heart failure: Secondary | ICD-10-CM | POA: Diagnosis not present

## 2018-11-07 DIAGNOSIS — J430 Unilateral pulmonary emphysema [MacLeod's syndrome]: Secondary | ICD-10-CM | POA: Diagnosis not present

## 2018-11-07 DIAGNOSIS — N183 Chronic kidney disease, stage 3 (moderate): Secondary | ICD-10-CM | POA: Diagnosis not present

## 2019-01-10 DIAGNOSIS — S2241XA Multiple fractures of ribs, right side, initial encounter for closed fracture: Secondary | ICD-10-CM | POA: Diagnosis not present

## 2019-01-10 DIAGNOSIS — R0789 Other chest pain: Secondary | ICD-10-CM | POA: Diagnosis not present

## 2019-02-16 DIAGNOSIS — D0462 Carcinoma in situ of skin of left upper limb, including shoulder: Secondary | ICD-10-CM | POA: Diagnosis not present

## 2019-02-16 DIAGNOSIS — X32XXXA Exposure to sunlight, initial encounter: Secondary | ICD-10-CM | POA: Diagnosis not present

## 2019-02-16 DIAGNOSIS — D485 Neoplasm of uncertain behavior of skin: Secondary | ICD-10-CM | POA: Diagnosis not present

## 2019-02-16 DIAGNOSIS — Z85828 Personal history of other malignant neoplasm of skin: Secondary | ICD-10-CM | POA: Diagnosis not present

## 2019-02-16 DIAGNOSIS — L570 Actinic keratosis: Secondary | ICD-10-CM | POA: Diagnosis not present

## 2019-02-16 DIAGNOSIS — Z08 Encounter for follow-up examination after completed treatment for malignant neoplasm: Secondary | ICD-10-CM | POA: Diagnosis not present

## 2019-02-16 DIAGNOSIS — D045 Carcinoma in situ of skin of trunk: Secondary | ICD-10-CM | POA: Diagnosis not present

## 2019-03-28 DIAGNOSIS — I495 Sick sinus syndrome: Secondary | ICD-10-CM | POA: Diagnosis not present

## 2019-03-30 DIAGNOSIS — D044 Carcinoma in situ of skin of scalp and neck: Secondary | ICD-10-CM | POA: Diagnosis not present

## 2019-03-30 DIAGNOSIS — X32XXXA Exposure to sunlight, initial encounter: Secondary | ICD-10-CM | POA: Diagnosis not present

## 2019-03-30 DIAGNOSIS — D0461 Carcinoma in situ of skin of right upper limb, including shoulder: Secondary | ICD-10-CM | POA: Diagnosis not present

## 2019-03-30 DIAGNOSIS — L570 Actinic keratosis: Secondary | ICD-10-CM | POA: Diagnosis not present

## 2019-05-03 DIAGNOSIS — E782 Mixed hyperlipidemia: Secondary | ICD-10-CM | POA: Diagnosis not present

## 2019-05-10 DIAGNOSIS — Z125 Encounter for screening for malignant neoplasm of prostate: Secondary | ICD-10-CM | POA: Diagnosis not present

## 2019-05-10 DIAGNOSIS — Z23 Encounter for immunization: Secondary | ICD-10-CM | POA: Diagnosis not present

## 2019-05-10 DIAGNOSIS — E782 Mixed hyperlipidemia: Secondary | ICD-10-CM | POA: Diagnosis not present

## 2019-05-10 DIAGNOSIS — I5022 Chronic systolic (congestive) heart failure: Secondary | ICD-10-CM | POA: Diagnosis not present

## 2019-05-10 DIAGNOSIS — N1832 Chronic kidney disease, stage 3b: Secondary | ICD-10-CM | POA: Diagnosis not present

## 2019-05-24 DIAGNOSIS — I5022 Chronic systolic (congestive) heart failure: Secondary | ICD-10-CM | POA: Diagnosis not present

## 2019-05-24 DIAGNOSIS — J430 Unilateral pulmonary emphysema [MacLeod's syndrome]: Secondary | ICD-10-CM | POA: Diagnosis not present

## 2019-05-24 DIAGNOSIS — I700 Atherosclerosis of aorta: Secondary | ICD-10-CM | POA: Diagnosis not present

## 2019-05-24 DIAGNOSIS — I10 Essential (primary) hypertension: Secondary | ICD-10-CM | POA: Diagnosis not present

## 2019-05-24 DIAGNOSIS — I2510 Atherosclerotic heart disease of native coronary artery without angina pectoris: Secondary | ICD-10-CM | POA: Diagnosis not present

## 2019-05-24 DIAGNOSIS — G4733 Obstructive sleep apnea (adult) (pediatric): Secondary | ICD-10-CM | POA: Diagnosis not present

## 2019-05-24 DIAGNOSIS — Z950 Presence of cardiac pacemaker: Secondary | ICD-10-CM | POA: Diagnosis not present

## 2019-05-24 DIAGNOSIS — E782 Mixed hyperlipidemia: Secondary | ICD-10-CM | POA: Diagnosis not present

## 2019-06-13 DIAGNOSIS — M519 Unspecified thoracic, thoracolumbar and lumbosacral intervertebral disc disorder: Secondary | ICD-10-CM | POA: Diagnosis not present

## 2019-06-23 DEATH — deceased

## 2019-07-20 DIAGNOSIS — D485 Neoplasm of uncertain behavior of skin: Secondary | ICD-10-CM | POA: Diagnosis not present

## 2019-07-20 DIAGNOSIS — L821 Other seborrheic keratosis: Secondary | ICD-10-CM | POA: Diagnosis not present

## 2019-07-20 DIAGNOSIS — D0462 Carcinoma in situ of skin of left upper limb, including shoulder: Secondary | ICD-10-CM | POA: Diagnosis not present

## 2019-07-20 DIAGNOSIS — D225 Melanocytic nevi of trunk: Secondary | ICD-10-CM | POA: Diagnosis not present

## 2019-07-20 DIAGNOSIS — Z08 Encounter for follow-up examination after completed treatment for malignant neoplasm: Secondary | ICD-10-CM | POA: Diagnosis not present

## 2019-07-20 DIAGNOSIS — Z85828 Personal history of other malignant neoplasm of skin: Secondary | ICD-10-CM | POA: Diagnosis not present

## 2019-07-20 DIAGNOSIS — D0461 Carcinoma in situ of skin of right upper limb, including shoulder: Secondary | ICD-10-CM | POA: Diagnosis not present

## 2019-08-02 DIAGNOSIS — D485 Neoplasm of uncertain behavior of skin: Secondary | ICD-10-CM | POA: Diagnosis not present

## 2019-08-02 DIAGNOSIS — C44622 Squamous cell carcinoma of skin of right upper limb, including shoulder: Secondary | ICD-10-CM | POA: Diagnosis not present

## 2019-08-02 DIAGNOSIS — D0461 Carcinoma in situ of skin of right upper limb, including shoulder: Secondary | ICD-10-CM | POA: Diagnosis not present

## 2019-08-02 DIAGNOSIS — L570 Actinic keratosis: Secondary | ICD-10-CM | POA: Diagnosis not present

## 2019-08-14 DIAGNOSIS — I5022 Chronic systolic (congestive) heart failure: Secondary | ICD-10-CM | POA: Diagnosis not present

## 2019-08-14 DIAGNOSIS — M5116 Intervertebral disc disorders with radiculopathy, lumbar region: Secondary | ICD-10-CM | POA: Diagnosis not present

## 2019-08-16 DIAGNOSIS — D0462 Carcinoma in situ of skin of left upper limb, including shoulder: Secondary | ICD-10-CM | POA: Diagnosis not present

## 2019-09-12 DIAGNOSIS — M79605 Pain in left leg: Secondary | ICD-10-CM | POA: Diagnosis not present

## 2019-09-12 DIAGNOSIS — H9190 Unspecified hearing loss, unspecified ear: Secondary | ICD-10-CM | POA: Diagnosis not present

## 2019-09-13 ENCOUNTER — Other Ambulatory Visit: Payer: Self-pay | Admitting: Neurology

## 2019-09-13 DIAGNOSIS — M79605 Pain in left leg: Secondary | ICD-10-CM

## 2019-09-21 ENCOUNTER — Ambulatory Visit
Admission: RE | Admit: 2019-09-21 | Discharge: 2019-09-21 | Disposition: A | Discharge status: Home / Self Care | Payer: PPO | Source: Ambulatory Visit | Attending: Neurology | Admitting: Neurology

## 2019-09-21 ENCOUNTER — Other Ambulatory Visit: Payer: Self-pay

## 2019-09-21 DIAGNOSIS — M79605 Pain in left leg: Secondary | ICD-10-CM | POA: Diagnosis not present

## 2019-09-21 DIAGNOSIS — R200 Anesthesia of skin: Secondary | ICD-10-CM | POA: Diagnosis not present

## 2019-09-26 DIAGNOSIS — K219 Gastro-esophageal reflux disease without esophagitis: Secondary | ICD-10-CM | POA: Diagnosis not present

## 2019-09-26 DIAGNOSIS — H6123 Impacted cerumen, bilateral: Secondary | ICD-10-CM | POA: Diagnosis not present

## 2019-10-10 DIAGNOSIS — I495 Sick sinus syndrome: Secondary | ICD-10-CM | POA: Diagnosis not present

## 2019-10-24 ENCOUNTER — Encounter: Payer: Self-pay | Admitting: Emergency Medicine

## 2019-10-24 ENCOUNTER — Other Ambulatory Visit: Payer: Self-pay

## 2019-10-24 ENCOUNTER — Emergency Department
Admission: EM | Admit: 2019-10-24 | Discharge: 2019-10-24 | Disposition: A | Discharge status: Home / Self Care | Payer: PPO | Attending: Emergency Medicine | Admitting: Emergency Medicine

## 2019-10-24 ENCOUNTER — Emergency Department: Payer: PPO

## 2019-10-24 DIAGNOSIS — Z7982 Long term (current) use of aspirin: Secondary | ICD-10-CM | POA: Diagnosis not present

## 2019-10-24 DIAGNOSIS — R0789 Other chest pain: Secondary | ICD-10-CM

## 2019-10-24 DIAGNOSIS — Z87891 Personal history of nicotine dependence: Secondary | ICD-10-CM | POA: Diagnosis not present

## 2019-10-24 DIAGNOSIS — J449 Chronic obstructive pulmonary disease, unspecified: Secondary | ICD-10-CM | POA: Insufficient documentation

## 2019-10-24 DIAGNOSIS — R079 Chest pain, unspecified: Secondary | ICD-10-CM | POA: Diagnosis not present

## 2019-10-24 DIAGNOSIS — Z79899 Other long term (current) drug therapy: Secondary | ICD-10-CM | POA: Diagnosis not present

## 2019-10-24 DIAGNOSIS — R911 Solitary pulmonary nodule: Secondary | ICD-10-CM

## 2019-10-24 DIAGNOSIS — I10 Essential (primary) hypertension: Secondary | ICD-10-CM | POA: Insufficient documentation

## 2019-10-24 LAB — CBC
HCT: 45.5 % (ref 39.0–52.0)
Hemoglobin: 16.2 g/dL (ref 13.0–17.0)
MCH: 33.4 pg (ref 26.0–34.0)
MCHC: 35.6 g/dL (ref 30.0–36.0)
MCV: 93.8 fL (ref 80.0–100.0)
Platelets: 152 10*3/uL (ref 150–400)
RBC: 4.85 MIL/uL (ref 4.22–5.81)
RDW: 12.5 % (ref 11.5–15.5)
WBC: 6.7 10*3/uL (ref 4.0–10.5)
nRBC: 0 % (ref 0.0–0.2)

## 2019-10-24 LAB — FIBRIN DERIVATIVES D-DIMER (ARMC ONLY): Fibrin derivatives D-dimer (ARMC): 678.67 ng/mL (FEU) — ABNORMAL HIGH (ref 0.00–499.00)

## 2019-10-24 LAB — BASIC METABOLIC PANEL
Anion gap: 6 (ref 5–15)
BUN: 23 mg/dL (ref 8–23)
CO2: 29 mmol/L (ref 22–32)
Calcium: 8.7 mg/dL — ABNORMAL LOW (ref 8.9–10.3)
Chloride: 104 mmol/L (ref 98–111)
Creatinine, Ser: 1.9 mg/dL — ABNORMAL HIGH (ref 0.61–1.24)
GFR calc Af Amer: 37 mL/min — ABNORMAL LOW (ref >60)
GFR calc non Af Amer: 32 mL/min — ABNORMAL LOW (ref >60)
Glucose, Bld: 128 mg/dL — ABNORMAL HIGH (ref 70–99)
Potassium: 3.6 mmol/L (ref 3.5–5.1)
Sodium: 139 mmol/L (ref 135–145)

## 2019-10-24 LAB — TROPONIN I (HIGH SENSITIVITY)
Troponin I (High Sensitivity): 6 ng/L (ref <18)
Troponin I (High Sensitivity): 6 ng/L (ref <18)

## 2019-10-24 MED ORDER — CYCLOBENZAPRINE HCL 7.5 MG PO TABS: 7.5000 mg | ORAL_TABLET | Freq: Three times a day (TID) | ORAL | 0 refills | Status: DC | PRN

## 2019-10-24 MED ORDER — ASPIRIN 81 MG PO CHEW
324.0000 mg | CHEWABLE_TABLET | Freq: Once | ORAL | Status: AC
  Administered 2019-10-24: 16:00:00 324 mg via ORAL
  Filled 2019-10-24: qty 4

## 2019-10-24 MED ORDER — SODIUM CHLORIDE 0.9% FLUSH: 3.0000 mL | Freq: Once | INTRAVENOUS | Status: DC

## 2019-10-24 NOTE — ED Notes (Signed)
Pt states coming in for chest pain. Pt states pain started yesterday and is radiating down his left arm.

## 2019-10-24 NOTE — ED Triage Notes (Signed)
First Nurse Note:  Arrives from Cardiology with c/o left sided chest pain radiating down left arm.  Patient is AAOx3.  Skin warm and dry.  No SOB/ DOE.  No EKG done PTA.

## 2019-10-24 NOTE — ED Provider Notes (Signed)
[facility] Emergency Department Provider Note   ____________________________________________   First MD Initiated Contact with Patient 10/24/19 1529     (approximate)  I have reviewed the triage vital signs and the nursing notes.   HISTORY  Chief Complaint Chest Pain    HPI A 84 year old patient with a history of hypertension and hypercholesterolemia presents for evaluation of chest pain. Initial onset of pain was approximately 3-6 hours ago. The patient's chest pain is not worse with exertion. The patient reports some diaphoresis. The patient's chest pain is middle- or left-sided, is not well-localized, is not described as heaviness/pressure/tightness, is not sharp and does radiate to the arms/jaw/neck. The patient does not complain of nausea. The patient has no history of stroke, has no history of peripheral artery disease, has not smoked in the past 90 days, denies any history of treated diabetes, has no relevant family history of coronary artery disease (first degree relative at less than age 56) and does not have an elevated BMI (>=30).   Feels at times sharp discomfort sometimes radiating from his upper back scapular region toward his left arm.  Sometimes felt like a pressure sensation and was associated with some shortness of breath when he got the cardiology clinic.  Was seen by Dr. Josefa Half and referred to the ER for further work-up.  I discussed with Dr. Josefa Half, he advises seems atypical symptomatology and the patient had a very clean coronary catheterization prior.  He advises if second troponin is normal he would doubt acute cardiac or pacemaker related etiology.  Past Medical History:  Diagnosis Date  . Arthritis   . COPD (chronic obstructive pulmonary disease) ([place])   . Hoarseness of voice   . Hypertension   . MRSA (methicillin resistant staph aureus) culture positive   . Sick sinus syndrome ([place])   . Sleep apnea   . Tremor     Patient  Active Problem List   Diagnosis Date Noted  . ADHESIVE CAPSULITIS, LEFT 06/28/2008  . ROTATOR CUFF SYNDROME 06/28/2008    Past Surgical History:  Procedure Laterality Date  . CATARACT EXTRACTION    . COLONOSCOPY WITH PROPOFOL N/A 01/15/2016   Procedure: COLONOSCOPY WITH PROPOFOL;  Surgeon: Manya Silvas, MD;  Location: [facility] ENDOSCOPY;  Service: Endoscopy;  Laterality: N/A;  . ESOPHAGOGASTRODUODENOSCOPY (EGD) WITH PROPOFOL N/A 01/15/2016   Procedure: ESOPHAGOGASTRODUODENOSCOPY (EGD) WITH PROPOFOL;  Surgeon: Manya Silvas, MD;  Location: [facility] ENDOSCOPY;  Service: Endoscopy;  Laterality: N/A;  . INSERT / REPLACE / REMOVE PACEMAKER    . POSTERIOR LAMINECTOMY / DECOMPRESSION LUMBAR SPINE      Prior to Admission medications   Medication Sig Start Date End Date Taking? Authorizing Provider  amLODipine (NORVASC) 5 MG tablet Take 5 mg by mouth daily.   Yes [provider]  aspirin 81 MG tablet Take 81 mg by mouth every other day.    Yes [provider]  Biotin 1 MG CAPS Take by mouth.   Yes [provider]  Cholecalciferol 1000 units tablet Take 1,000 Units by mouth daily.   Yes [provider]  colchicine 0.6 MG tablet Take 0.6 mg by mouth daily. 08/09/19  Yes [provider]  cyanocobalamin 1000 MCG tablet Take 1,000 mcg by mouth daily.   Yes [provider]  furosemide (LASIX) 20 MG tablet Take 20 mg by mouth daily. 08/01/19  Yes [provider]  isosorbide mononitrate (IMDUR) 30 MG 24 hr tablet Take 30 mg by mouth daily. 10/11/19  Yes [provider]  pravastatin (PRAVACHOL) 20 MG tablet Take 20 mg by mouth at bedtime.    Yes [provider]  primidone (MYSOLINE) 50 MG tablet Take 50-100 mg by mouth 2 (two) times daily. 100MG -AM 50MG -PM 10/18/19  Yes [provider]  propranolol ER (INDERAL LA) 120 MG 24 hr capsule Take 120 mg by mouth daily. 09/12/19  Yes [provider]  topiramate (TOPAMAX) 50  MG tablet Take 50 mg by mouth daily.   Yes [provider]  acetaminophen (TYLENOL) 325 MG tablet Take 650 mg by mouth every 6 (six) hours as needed.    [provider]    Allergies Lipitor [atorvastatin]  No family history on file.  Social History Social History   Tobacco Use  . Smoking status: Former Research scientist (life sciences)  . Smokeless tobacco: Never Used  Substance Use Topics  . Alcohol use: No  . Drug use: No    Review of Systems Constitutional: No fever/chills Eyes: No visual changes. ENT: No sore throat. Cardiovascular: See HPI. Respiratory: Denies shortness of breath now but felt short of breath when the pain was its greatest when he went to the clinic. Gastrointestinal: No abdominal pain.   Genitourinary: Negative for dysuria. Musculoskeletal: Negative for back pain. Skin: Negative for rash.  Felt a bit sweaty when his pain was at its peak at the clinic. Neurological: Negative for headaches, areas of focal weakness or numbness.    ____________________________________________   PHYSICAL EXAM:  VITAL SIGNS: ED Triage Vitals  Enc Vitals Group     BP 10/24/19 1353 110/77     Pulse Rate 10/24/19 1353 63     Resp 10/24/19 1353 16     Temp 10/24/19 1353 97.9 F (36.6 C)     Temp Source 10/24/19 1353 Oral     SpO2 10/24/19 1353 96 %     Weight 10/24/19 1351 175 lb (79.4 kg)     Height 10/24/19 1351 5\' 9"  (1.753 m)     Head Circumference --      Peak Flow --      Pain Score 10/24/19 1350 4     Pain Loc --      Pain Edu? --      Excl. in [place]? --     Constitutional: Alert and oriented. Well appearing and in no acute distress. Eyes: Conjunctivae are normal. Head: Atraumatic. Nose: No congestion/rhinnorhea. Mouth/Throat: Mucous membranes are moist. Neck: No stridor.  Cardiovascular: Normal rate, regular rhythm. Grossly normal heart sounds.  Good peripheral circulation. Respiratory: Normal respiratory effort.  No retractions. Lungs CTAB. Gastrointestinal:  Soft and nontender. No distention. Musculoskeletal: No lower extremity tenderness nor edema.  Has some reproducible discomfort with the ranges the left shoulder he refers pain towards his left upper scapular region reports it does radiate some towards his pacemaker pocket and also the left upper arm.  It is sharp in nature.  Having occasional sharp lancing of discomfort in the area of his left upper chest.  Not worsened by breathing. Neurologic:  Normal speech and language. No gross focal neurologic deficits are appreciated.  Skin:  Skin is warm, dry and intact. No rash noted. Psychiatric: Mood and affect are normal. Speech and behavior are normal.  ____________________________________________   LABS (all labs ordered are listed, but only abnormal results are displayed)  Labs Reviewed  BASIC METABOLIC PANEL - Abnormal; Notable for the following components:      Result Value   Glucose, Bld 128 (*)  Creatinine, Ser 1.90 (*)    Calcium 8.7 (*)    GFR calc non Af Amer 32 (*)    GFR calc Af Amer 37 (*)    All other components within normal limits  FIBRIN DERIVATIVES D-DIMER ([facility] ONLY) - Abnormal; Notable for the following components:   Fibrin derivatives D-dimer ([facility]) 678.67 (*)    All other components within normal limits  CBC  TROPONIN I (HIGH SENSITIVITY)  TROPONIN I (HIGH SENSITIVITY)   ____________________________________________  EKG  Reviewed inter by me at 1400 Heart rate 65 QRS 90 QTc normal Atrial pacing, no evidence of acute ischemia.  T waves appear normal ____________________________________________  RADIOLOGY  DG Chest 2 View  Result Date: 10/24/2019 CLINICAL DATA:  Chest pain EXAM: CHEST - 2 VIEW COMPARISON:  April 12, 2008 FINDINGS: Pacemaker leads are attached to the right atrium and right ventricle. Heart size and pulmonary vascularity are normal. Lungs are clear. No adenopathy. No pneumothorax. No bone lesions. IMPRESSION: Lungs clear. Pacemaker leads  attached to right atrium and right ventricle. Heart size normal. Electronically Signed   By: Lowella Grip III M.D.   On: 10/24/2019 14:17   CT Chest Wo Contrast  Result Date: 10/24/2019 CLINICAL DATA:  Chest pain. EXAM: CT CHEST WITHOUT CONTRAST TECHNIQUE: Multidetector CT imaging of the chest was performed following the standard protocol without IV contrast. COMPARISON:  June 19, 2014. FINDINGS: Cardiovascular: Atherosclerosis of thoracic aorta is noted without aneurysm formation. Normal cardiac size. No pericardial effusion. Left-sided pacemaker is again noted. Mediastinum/Nodes: No enlarged mediastinal or axillary lymph nodes. Thyroid gland is unremarkable. Small sliding-type hiatal hernia is noted. Lungs/Pleura: No pneumothorax or pleural effusion is noted. 3 mm nodule is noted in left upper lobe best seen on image number 47 of series 3. No consolidative process is noted. Upper Abdomen: No acute abnormality. Musculoskeletal: No chest wall mass or suspicious bone lesions identified. IMPRESSION: 1. Small sliding-type hiatal hernia. 2. 3 mm left upper lobe nodule is noted. No follow-up needed if patient is low-risk. Non-contrast chest CT can be considered in 12 months if patient is high-risk. This recommendation follows the consensus statement: Guidelines for Management of Incidental Pulmonary Nodules Detected on CT Images: From the Fleischner Society 2017; Radiology 2017; 284:228-243. Aortic Atherosclerosis (ICD10-I70.0). Electronically Signed   By: Marijo Conception M.D.   On: 10/24/2019 16:15     Imaging studies reviewed discussed with the patient.  ____________________________________________   PROCEDURES  Procedure(s) performed: None  Procedures ____________________________________________   INITIAL IMPRESSION / ASSESSMENT AND PLAN / ED COURSE  Pertinent labs & imaging results that were available during my care of the patient were reviewed by me and considered in my medical decision  making (see chart for details).   Differential diagnosis includes, but is not limited to, ACS, aortic dissection, pulmonary embolism, cardiac tamponade, pneumothorax, pneumonia, pericarditis, myocarditis, GI-related causes including esophagitis/gastritis, and musculoskeletal chest wall pain.     Patient presents with atypical chest pain, on located some along his left shoulder blade, left upper back, then radiates pain sometimes to his l lateral rib but not to the abdominal region.    Patient has no high risk criteria for PE by [place].  D-dimer age-adjusted.  Specifically discussed with the patient further work-up including CT scan to evaluate deeper in his chest for cause of pain evaluate for his causes such as aneurysm, dissection, pneumonia.  We will proceed with CT scan without contrast.  I discussed with him and given his marginal kidney function  chronic kidney disease we will avoid nephrotoxic agents even though this may diminish detectability of some of our studies, but wish to preserve his kidney function and not cause undue kidney injury.  This was decision made with shared medical decision making that I think is very reasonable high pretest probability for dissection, aneurysm, pulmonary embolism are low   Clinical Course as of Oct 24 1719  Tue Oct 24, 2019  1711 Utilized age-adjusted D-dimer, patient D-dimer below threshold of 810.  Low risk VTE.   [MQ]    Clinical Course User Index [MQ] Delman Kitten, MD   ----------------------------------------- 5:27 PM on 10/24/2019 -----------------------------------------  Patient resting comfortably, granddaughter at bedside.  Discussed patient plan forward, he is comfortable with plan to follow-up with Dr. Josefa Half whom I already discussed the case with.  Discussed with cardiology, Dr. Saralyn Pilar advises second troponin if this reassuring with discharge for close follow-up.  Notify Dr. Josefa Half of results, and patient comfortable with plan to  return if worsening symptoms.  Return precautions discussed.  I advised patient to notify Dr. Sabra Heck regarding lung nodule as well. ____________________________________________   FINAL CLINICAL IMPRESSION(S) / ED DIAGNOSES  Final diagnoses:  Nodule of upper lobe of left lung  Chest pain, atypical        Note:  This document was prepared using Dragon voice recognition software and may include unintentional dictation errors       Delman Kitten, MD 10/24/19 1728

## 2019-10-24 NOTE — ED Notes (Signed)
Family at bedside. 

## 2019-10-24 NOTE — Discharge Instructions (Signed)

## 2019-10-24 NOTE — ED Triage Notes (Addendum)
Patient reports left-sided chest pain that radiated down left arm and up neck. Also reports some pain across back. Patient reports SOB as well. States this has been worsening for the past 2 weeks. States yesterday started having nausea but no vomiting.   Has pacemaker in place. Dr. Saralyn Pilar is patient's cardiologist

## 2019-10-24 NOTE — ED Notes (Signed)
ED Provider at bedside. 

## 2019-10-30 DIAGNOSIS — I5022 Chronic systolic (congestive) heart failure: Secondary | ICD-10-CM | POA: Diagnosis not present

## 2019-10-30 DIAGNOSIS — J431 Panlobular emphysema: Secondary | ICD-10-CM | POA: Diagnosis not present

## 2019-10-30 DIAGNOSIS — I700 Atherosclerosis of aorta: Secondary | ICD-10-CM | POA: Diagnosis not present

## 2019-10-30 DIAGNOSIS — M5010 Cervical disc disorder with radiculopathy, unspecified cervical region: Secondary | ICD-10-CM | POA: Diagnosis not present

## 2019-10-30 DIAGNOSIS — R0789 Other chest pain: Secondary | ICD-10-CM | POA: Diagnosis not present

## 2019-10-30 DIAGNOSIS — N1832 Chronic kidney disease, stage 3b: Secondary | ICD-10-CM | POA: Diagnosis not present

## 2019-11-21 DIAGNOSIS — E782 Mixed hyperlipidemia: Secondary | ICD-10-CM | POA: Diagnosis not present

## 2019-11-21 DIAGNOSIS — Z125 Encounter for screening for malignant neoplasm of prostate: Secondary | ICD-10-CM | POA: Diagnosis not present

## 2019-11-24 ENCOUNTER — Encounter: Payer: Self-pay | Admitting: *Deleted

## 2019-11-24 NOTE — Progress Notes (Signed)
Abnormal finding noted. PCP notified and requested referral to the lung nodule clinic for further follow up if appropriate. 

## 2019-11-28 DIAGNOSIS — Z0000 Encounter for general adult medical examination without abnormal findings: Secondary | ICD-10-CM | POA: Diagnosis not present

## 2019-11-28 DIAGNOSIS — J431 Panlobular emphysema: Secondary | ICD-10-CM | POA: Diagnosis not present

## 2019-11-28 DIAGNOSIS — N1832 Chronic kidney disease, stage 3b: Secondary | ICD-10-CM | POA: Diagnosis not present

## 2019-11-28 DIAGNOSIS — I5022 Chronic systolic (congestive) heart failure: Secondary | ICD-10-CM | POA: Diagnosis not present

## 2019-11-28 DIAGNOSIS — E782 Mixed hyperlipidemia: Secondary | ICD-10-CM | POA: Diagnosis not present

## 2019-12-04 DIAGNOSIS — R0602 Shortness of breath: Secondary | ICD-10-CM | POA: Diagnosis not present

## 2019-12-04 DIAGNOSIS — E782 Mixed hyperlipidemia: Secondary | ICD-10-CM | POA: Diagnosis not present

## 2019-12-04 DIAGNOSIS — I10 Essential (primary) hypertension: Secondary | ICD-10-CM | POA: Diagnosis not present

## 2019-12-04 DIAGNOSIS — Z950 Presence of cardiac pacemaker: Secondary | ICD-10-CM | POA: Diagnosis not present

## 2019-12-04 DIAGNOSIS — I700 Atherosclerosis of aorta: Secondary | ICD-10-CM | POA: Diagnosis not present

## 2020-01-17 DIAGNOSIS — C441121 Basal cell carcinoma of skin of right upper eyelid, including canthus: Secondary | ICD-10-CM | POA: Diagnosis not present

## 2020-01-17 DIAGNOSIS — D0462 Carcinoma in situ of skin of left upper limb, including shoulder: Secondary | ICD-10-CM | POA: Diagnosis not present

## 2020-01-17 DIAGNOSIS — M79605 Pain in left leg: Secondary | ICD-10-CM | POA: Diagnosis not present

## 2020-01-17 DIAGNOSIS — R251 Tremor, unspecified: Secondary | ICD-10-CM | POA: Diagnosis not present

## 2020-01-17 DIAGNOSIS — C4441 Basal cell carcinoma of skin of scalp and neck: Secondary | ICD-10-CM | POA: Diagnosis not present

## 2020-01-17 DIAGNOSIS — M519 Unspecified thoracic, thoracolumbar and lumbosacral intervertebral disc disorder: Secondary | ICD-10-CM | POA: Diagnosis not present

## 2020-01-17 DIAGNOSIS — M5481 Occipital neuralgia: Secondary | ICD-10-CM | POA: Diagnosis not present

## 2020-01-17 DIAGNOSIS — D485 Neoplasm of uncertain behavior of skin: Secondary | ICD-10-CM | POA: Diagnosis not present

## 2020-01-17 DIAGNOSIS — L570 Actinic keratosis: Secondary | ICD-10-CM | POA: Diagnosis not present

## 2020-01-17 DIAGNOSIS — X32XXXA Exposure to sunlight, initial encounter: Secondary | ICD-10-CM | POA: Diagnosis not present

## 2020-01-30 DIAGNOSIS — I495 Sick sinus syndrome: Secondary | ICD-10-CM | POA: Diagnosis not present

## 2020-02-22 DIAGNOSIS — D0462 Carcinoma in situ of skin of left upper limb, including shoulder: Secondary | ICD-10-CM | POA: Diagnosis not present

## 2020-02-22 DIAGNOSIS — L570 Actinic keratosis: Secondary | ICD-10-CM | POA: Diagnosis not present

## 2020-03-06 DIAGNOSIS — J431 Panlobular emphysema: Secondary | ICD-10-CM | POA: Diagnosis not present

## 2020-03-06 DIAGNOSIS — E782 Mixed hyperlipidemia: Secondary | ICD-10-CM | POA: Diagnosis not present

## 2020-03-06 DIAGNOSIS — I5032 Chronic diastolic (congestive) heart failure: Secondary | ICD-10-CM | POA: Diagnosis not present

## 2020-03-06 DIAGNOSIS — C4441 Basal cell carcinoma of skin of scalp and neck: Secondary | ICD-10-CM | POA: Diagnosis not present

## 2020-03-06 DIAGNOSIS — D485 Neoplasm of uncertain behavior of skin: Secondary | ICD-10-CM | POA: Diagnosis not present

## 2020-03-06 DIAGNOSIS — Z950 Presence of cardiac pacemaker: Secondary | ICD-10-CM | POA: Diagnosis not present

## 2020-03-19 DIAGNOSIS — C441121 Basal cell carcinoma of skin of right upper eyelid, including canthus: Secondary | ICD-10-CM | POA: Diagnosis not present

## 2020-03-19 DIAGNOSIS — L578 Other skin changes due to chronic exposure to nonionizing radiation: Secondary | ICD-10-CM | POA: Diagnosis not present

## 2020-03-19 DIAGNOSIS — Z85828 Personal history of other malignant neoplasm of skin: Secondary | ICD-10-CM | POA: Diagnosis not present

## 2020-03-19 DIAGNOSIS — L988 Other specified disorders of the skin and subcutaneous tissue: Secondary | ICD-10-CM | POA: Diagnosis not present

## 2020-04-17 DIAGNOSIS — C4441 Basal cell carcinoma of skin of scalp and neck: Secondary | ICD-10-CM | POA: Diagnosis not present

## 2020-04-17 DIAGNOSIS — C44319 Basal cell carcinoma of skin of other parts of face: Secondary | ICD-10-CM | POA: Diagnosis not present

## 2020-05-23 DIAGNOSIS — E782 Mixed hyperlipidemia: Secondary | ICD-10-CM | POA: Diagnosis not present

## 2020-05-29 DIAGNOSIS — N1831 Chronic kidney disease, stage 3a: Secondary | ICD-10-CM | POA: Diagnosis not present

## 2020-05-29 DIAGNOSIS — Z125 Encounter for screening for malignant neoplasm of prostate: Secondary | ICD-10-CM | POA: Diagnosis not present

## 2020-05-29 DIAGNOSIS — I5032 Chronic diastolic (congestive) heart failure: Secondary | ICD-10-CM | POA: Diagnosis not present

## 2020-05-29 DIAGNOSIS — E782 Mixed hyperlipidemia: Secondary | ICD-10-CM | POA: Diagnosis not present

## 2020-06-04 DIAGNOSIS — I495 Sick sinus syndrome: Secondary | ICD-10-CM | POA: Diagnosis not present

## 2020-06-24 ENCOUNTER — Other Ambulatory Visit: Payer: Self-pay | Admitting: Otolaryngology

## 2020-06-24 DIAGNOSIS — K118 Other diseases of salivary glands: Secondary | ICD-10-CM

## 2020-06-24 DIAGNOSIS — R221 Localized swelling, mass and lump, neck: Secondary | ICD-10-CM | POA: Diagnosis not present

## 2020-06-24 DIAGNOSIS — H903 Sensorineural hearing loss, bilateral: Secondary | ICD-10-CM | POA: Diagnosis not present

## 2020-06-24 DIAGNOSIS — H6123 Impacted cerumen, bilateral: Secondary | ICD-10-CM | POA: Diagnosis not present

## 2020-07-05 ENCOUNTER — Other Ambulatory Visit: Payer: Self-pay

## 2020-07-05 ENCOUNTER — Ambulatory Visit
Admission: RE | Admit: 2020-07-05 | Discharge: 2020-07-05 | Disposition: A | Discharge status: Home / Self Care | Payer: HMO | Source: Ambulatory Visit | Attending: Otolaryngology | Admitting: Otolaryngology

## 2020-07-05 DIAGNOSIS — K118 Other diseases of salivary glands: Secondary | ICD-10-CM | POA: Insufficient documentation

## 2020-07-05 DIAGNOSIS — I6529 Occlusion and stenosis of unspecified carotid artery: Secondary | ICD-10-CM | POA: Diagnosis not present

## 2020-07-05 DIAGNOSIS — R221 Localized swelling, mass and lump, neck: Secondary | ICD-10-CM | POA: Diagnosis not present

## 2020-07-05 HISTORY — DX: Malignant (primary) neoplasm, unspecified: C80.1

## 2020-07-05 LAB — POCT I-STAT CREATININE: Creatinine, Ser: 1.9 mg/dL — ABNORMAL HIGH (ref 0.61–1.24)

## 2020-07-05 MED ORDER — IOHEXOL 300 MG/ML  SOLN
75.0000 mL | Freq: Once | INTRAMUSCULAR | Status: AC | PRN
  Administered 2020-07-05: 60 mL via INTRAVENOUS

## 2020-07-24 DIAGNOSIS — R251 Tremor, unspecified: Secondary | ICD-10-CM | POA: Diagnosis not present

## 2020-07-24 DIAGNOSIS — M519 Unspecified thoracic, thoracolumbar and lumbosacral intervertebral disc disorder: Secondary | ICD-10-CM | POA: Diagnosis not present

## 2020-07-24 DIAGNOSIS — M5481 Occipital neuralgia: Secondary | ICD-10-CM | POA: Diagnosis not present

## 2020-08-02 DIAGNOSIS — L570 Actinic keratosis: Secondary | ICD-10-CM | POA: Diagnosis not present

## 2020-08-02 DIAGNOSIS — C44729 Squamous cell carcinoma of skin of left lower limb, including hip: Secondary | ICD-10-CM | POA: Diagnosis not present

## 2020-08-02 DIAGNOSIS — C4441 Basal cell carcinoma of skin of scalp and neck: Secondary | ICD-10-CM | POA: Diagnosis not present

## 2020-08-02 DIAGNOSIS — X32XXXA Exposure to sunlight, initial encounter: Secondary | ICD-10-CM | POA: Diagnosis not present

## 2020-08-02 DIAGNOSIS — D485 Neoplasm of uncertain behavior of skin: Secondary | ICD-10-CM | POA: Diagnosis not present

## 2020-08-02 DIAGNOSIS — D044 Carcinoma in situ of skin of scalp and neck: Secondary | ICD-10-CM | POA: Diagnosis not present

## 2020-08-29 DIAGNOSIS — C44629 Squamous cell carcinoma of skin of left upper limb, including shoulder: Secondary | ICD-10-CM | POA: Diagnosis not present

## 2020-09-05 DIAGNOSIS — D044 Carcinoma in situ of skin of scalp and neck: Secondary | ICD-10-CM | POA: Diagnosis not present

## 2020-09-05 DIAGNOSIS — C4441 Basal cell carcinoma of skin of scalp and neck: Secondary | ICD-10-CM | POA: Diagnosis not present

## 2020-09-09 DIAGNOSIS — N1831 Chronic kidney disease, stage 3a: Secondary | ICD-10-CM | POA: Diagnosis not present

## 2020-09-09 DIAGNOSIS — J431 Panlobular emphysema: Secondary | ICD-10-CM | POA: Diagnosis not present

## 2020-09-09 DIAGNOSIS — I10 Essential (primary) hypertension: Secondary | ICD-10-CM | POA: Diagnosis not present

## 2020-09-09 DIAGNOSIS — G4733 Obstructive sleep apnea (adult) (pediatric): Secondary | ICD-10-CM | POA: Diagnosis not present

## 2020-09-09 DIAGNOSIS — E782 Mixed hyperlipidemia: Secondary | ICD-10-CM | POA: Diagnosis not present

## 2020-09-09 DIAGNOSIS — I5032 Chronic diastolic (congestive) heart failure: Secondary | ICD-10-CM | POA: Diagnosis not present

## 2020-09-09 DIAGNOSIS — I2510 Atherosclerotic heart disease of native coronary artery without angina pectoris: Secondary | ICD-10-CM | POA: Diagnosis not present

## 2020-09-09 DIAGNOSIS — I700 Atherosclerosis of aorta: Secondary | ICD-10-CM | POA: Diagnosis not present

## 2020-09-09 DIAGNOSIS — Z950 Presence of cardiac pacemaker: Secondary | ICD-10-CM | POA: Diagnosis not present

## 2020-11-27 DIAGNOSIS — X32XXXA Exposure to sunlight, initial encounter: Secondary | ICD-10-CM | POA: Diagnosis not present

## 2020-11-27 DIAGNOSIS — L570 Actinic keratosis: Secondary | ICD-10-CM | POA: Diagnosis not present

## 2020-11-27 DIAGNOSIS — D045 Carcinoma in situ of skin of trunk: Secondary | ICD-10-CM | POA: Diagnosis not present

## 2020-11-27 DIAGNOSIS — D485 Neoplasm of uncertain behavior of skin: Secondary | ICD-10-CM | POA: Diagnosis not present

## 2020-11-28 DIAGNOSIS — M5481 Occipital neuralgia: Secondary | ICD-10-CM | POA: Diagnosis not present

## 2020-11-28 DIAGNOSIS — R251 Tremor, unspecified: Secondary | ICD-10-CM | POA: Diagnosis not present

## 2020-12-03 DIAGNOSIS — E782 Mixed hyperlipidemia: Secondary | ICD-10-CM | POA: Diagnosis not present

## 2020-12-03 DIAGNOSIS — Z125 Encounter for screening for malignant neoplasm of prostate: Secondary | ICD-10-CM | POA: Diagnosis not present

## 2020-12-05 DIAGNOSIS — D045 Carcinoma in situ of skin of trunk: Secondary | ICD-10-CM | POA: Diagnosis not present

## 2020-12-10 DIAGNOSIS — I700 Atherosclerosis of aorta: Secondary | ICD-10-CM | POA: Diagnosis not present

## 2020-12-10 DIAGNOSIS — J431 Panlobular emphysema: Secondary | ICD-10-CM | POA: Diagnosis not present

## 2020-12-10 DIAGNOSIS — Z0000 Encounter for general adult medical examination without abnormal findings: Secondary | ICD-10-CM | POA: Diagnosis not present

## 2020-12-10 DIAGNOSIS — I495 Sick sinus syndrome: Secondary | ICD-10-CM | POA: Diagnosis present

## 2020-12-10 DIAGNOSIS — R972 Elevated prostate specific antigen [PSA]: Secondary | ICD-10-CM | POA: Diagnosis not present

## 2020-12-10 DIAGNOSIS — I5032 Chronic diastolic (congestive) heart failure: Secondary | ICD-10-CM | POA: Diagnosis not present

## 2020-12-10 DIAGNOSIS — E782 Mixed hyperlipidemia: Secondary | ICD-10-CM | POA: Diagnosis not present

## 2020-12-10 DIAGNOSIS — N1832 Chronic kidney disease, stage 3b: Secondary | ICD-10-CM | POA: Diagnosis not present

## 2021-01-09 DIAGNOSIS — I2510 Atherosclerotic heart disease of native coronary artery without angina pectoris: Secondary | ICD-10-CM | POA: Diagnosis not present

## 2021-01-09 DIAGNOSIS — I10 Essential (primary) hypertension: Secondary | ICD-10-CM | POA: Diagnosis not present

## 2021-01-09 DIAGNOSIS — E782 Mixed hyperlipidemia: Secondary | ICD-10-CM | POA: Diagnosis not present

## 2021-01-09 DIAGNOSIS — J431 Panlobular emphysema: Secondary | ICD-10-CM | POA: Diagnosis not present

## 2021-01-09 DIAGNOSIS — I5032 Chronic diastolic (congestive) heart failure: Secondary | ICD-10-CM | POA: Diagnosis not present

## 2021-01-09 DIAGNOSIS — Z950 Presence of cardiac pacemaker: Secondary | ICD-10-CM | POA: Diagnosis not present

## 2021-02-14 DIAGNOSIS — L570 Actinic keratosis: Secondary | ICD-10-CM | POA: Diagnosis not present

## 2021-02-14 DIAGNOSIS — D485 Neoplasm of uncertain behavior of skin: Secondary | ICD-10-CM | POA: Diagnosis not present

## 2021-02-14 DIAGNOSIS — D044 Carcinoma in situ of skin of scalp and neck: Secondary | ICD-10-CM | POA: Diagnosis not present

## 2021-02-14 DIAGNOSIS — L821 Other seborrheic keratosis: Secondary | ICD-10-CM | POA: Diagnosis not present

## 2021-02-14 DIAGNOSIS — X32XXXA Exposure to sunlight, initial encounter: Secondary | ICD-10-CM | POA: Diagnosis not present

## 2021-03-04 DIAGNOSIS — H9313 Tinnitus, bilateral: Secondary | ICD-10-CM | POA: Diagnosis not present

## 2021-03-04 DIAGNOSIS — H6123 Impacted cerumen, bilateral: Secondary | ICD-10-CM | POA: Diagnosis not present

## 2021-03-04 DIAGNOSIS — H903 Sensorineural hearing loss, bilateral: Secondary | ICD-10-CM | POA: Diagnosis not present

## 2021-03-12 DIAGNOSIS — R972 Elevated prostate specific antigen [PSA]: Secondary | ICD-10-CM | POA: Diagnosis not present

## 2021-04-02 DIAGNOSIS — L905 Scar conditions and fibrosis of skin: Secondary | ICD-10-CM | POA: Diagnosis not present

## 2021-04-02 DIAGNOSIS — D044 Carcinoma in situ of skin of scalp and neck: Secondary | ICD-10-CM | POA: Diagnosis not present

## 2021-04-04 IMAGING — CR DG CHEST 2V
1 series · 2 of 2 positions shown · non-contrast
Comparison: April 12, 2008

CLINICAL DATA: Chest pain

EXAM:
CHEST - 2 VIEW

[Series 1: dg chest 2 view · 0.14mm/px · 2 of 2 slices shown]
[im 1/2]
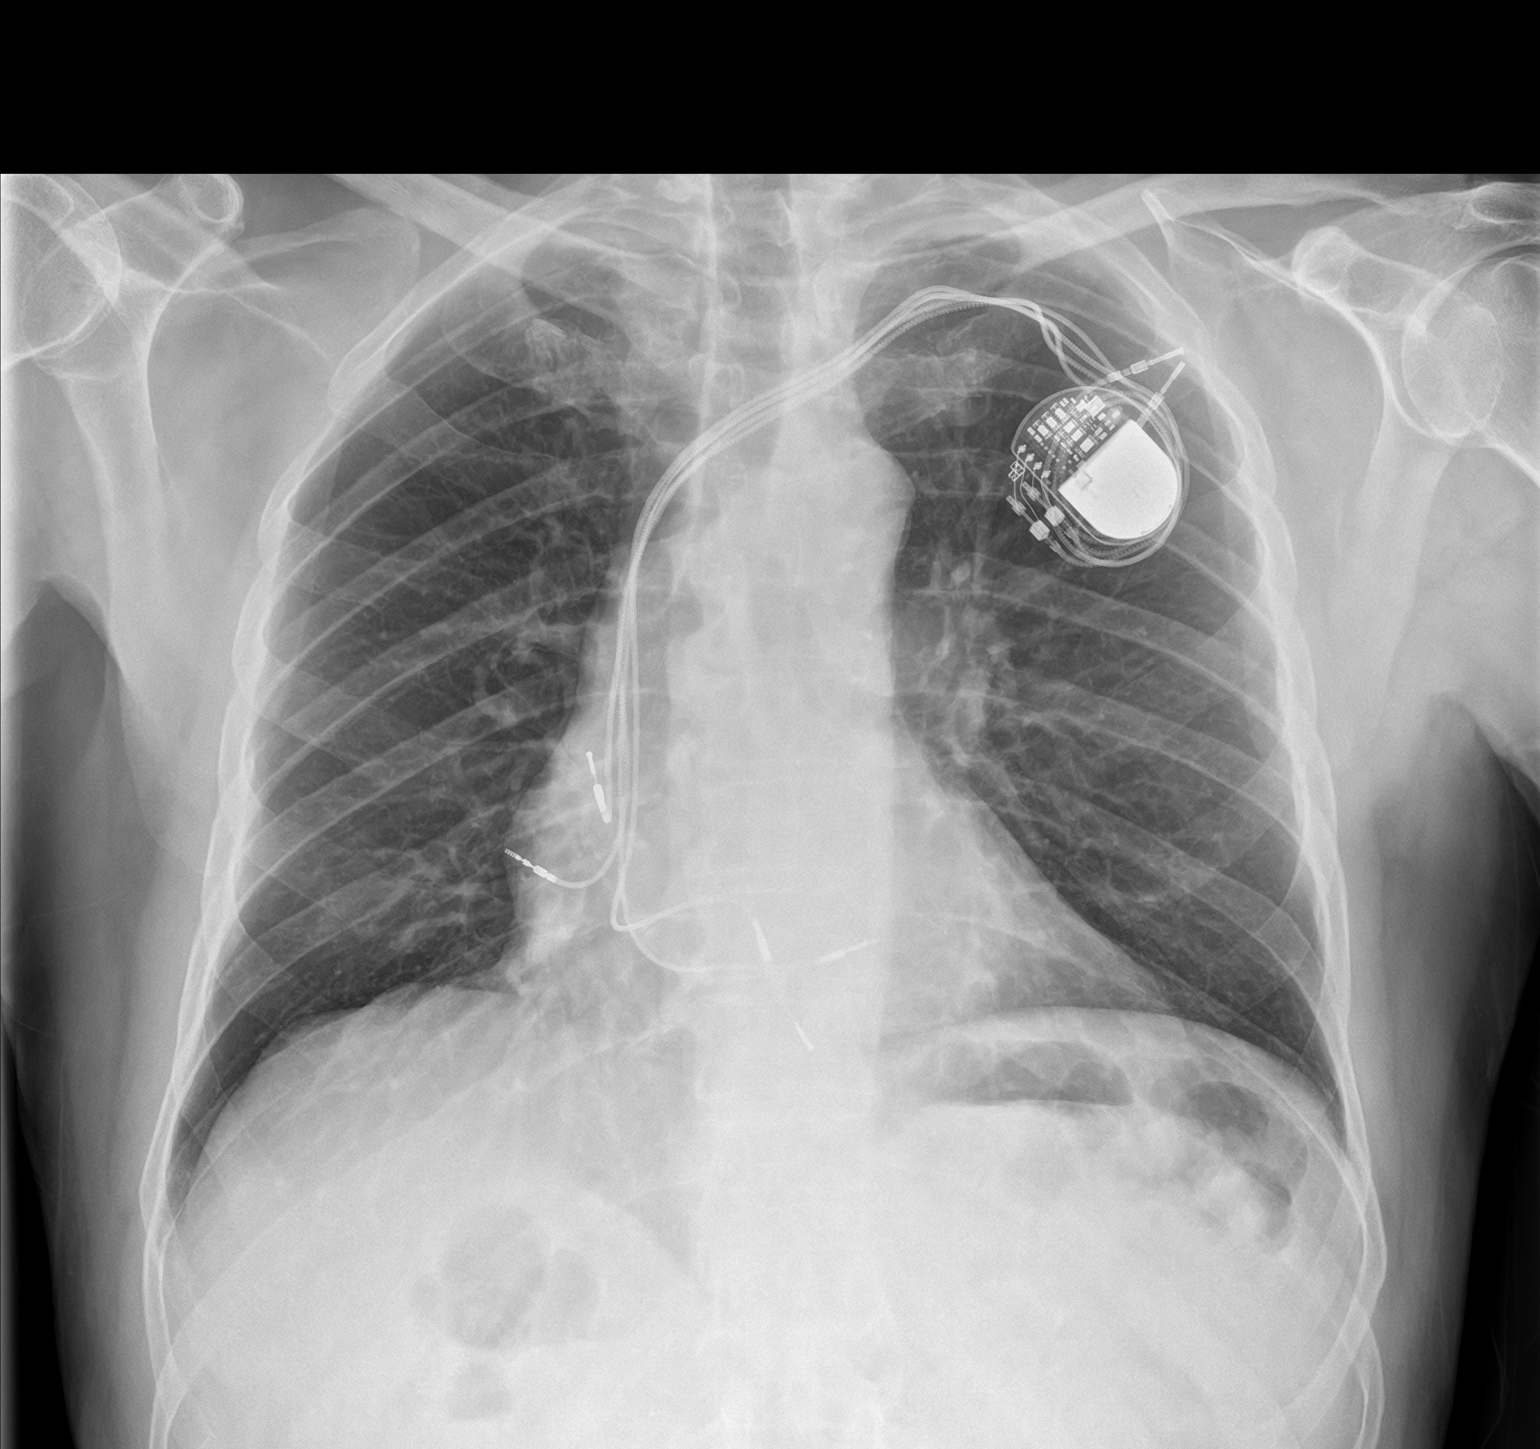
[im 2/2]
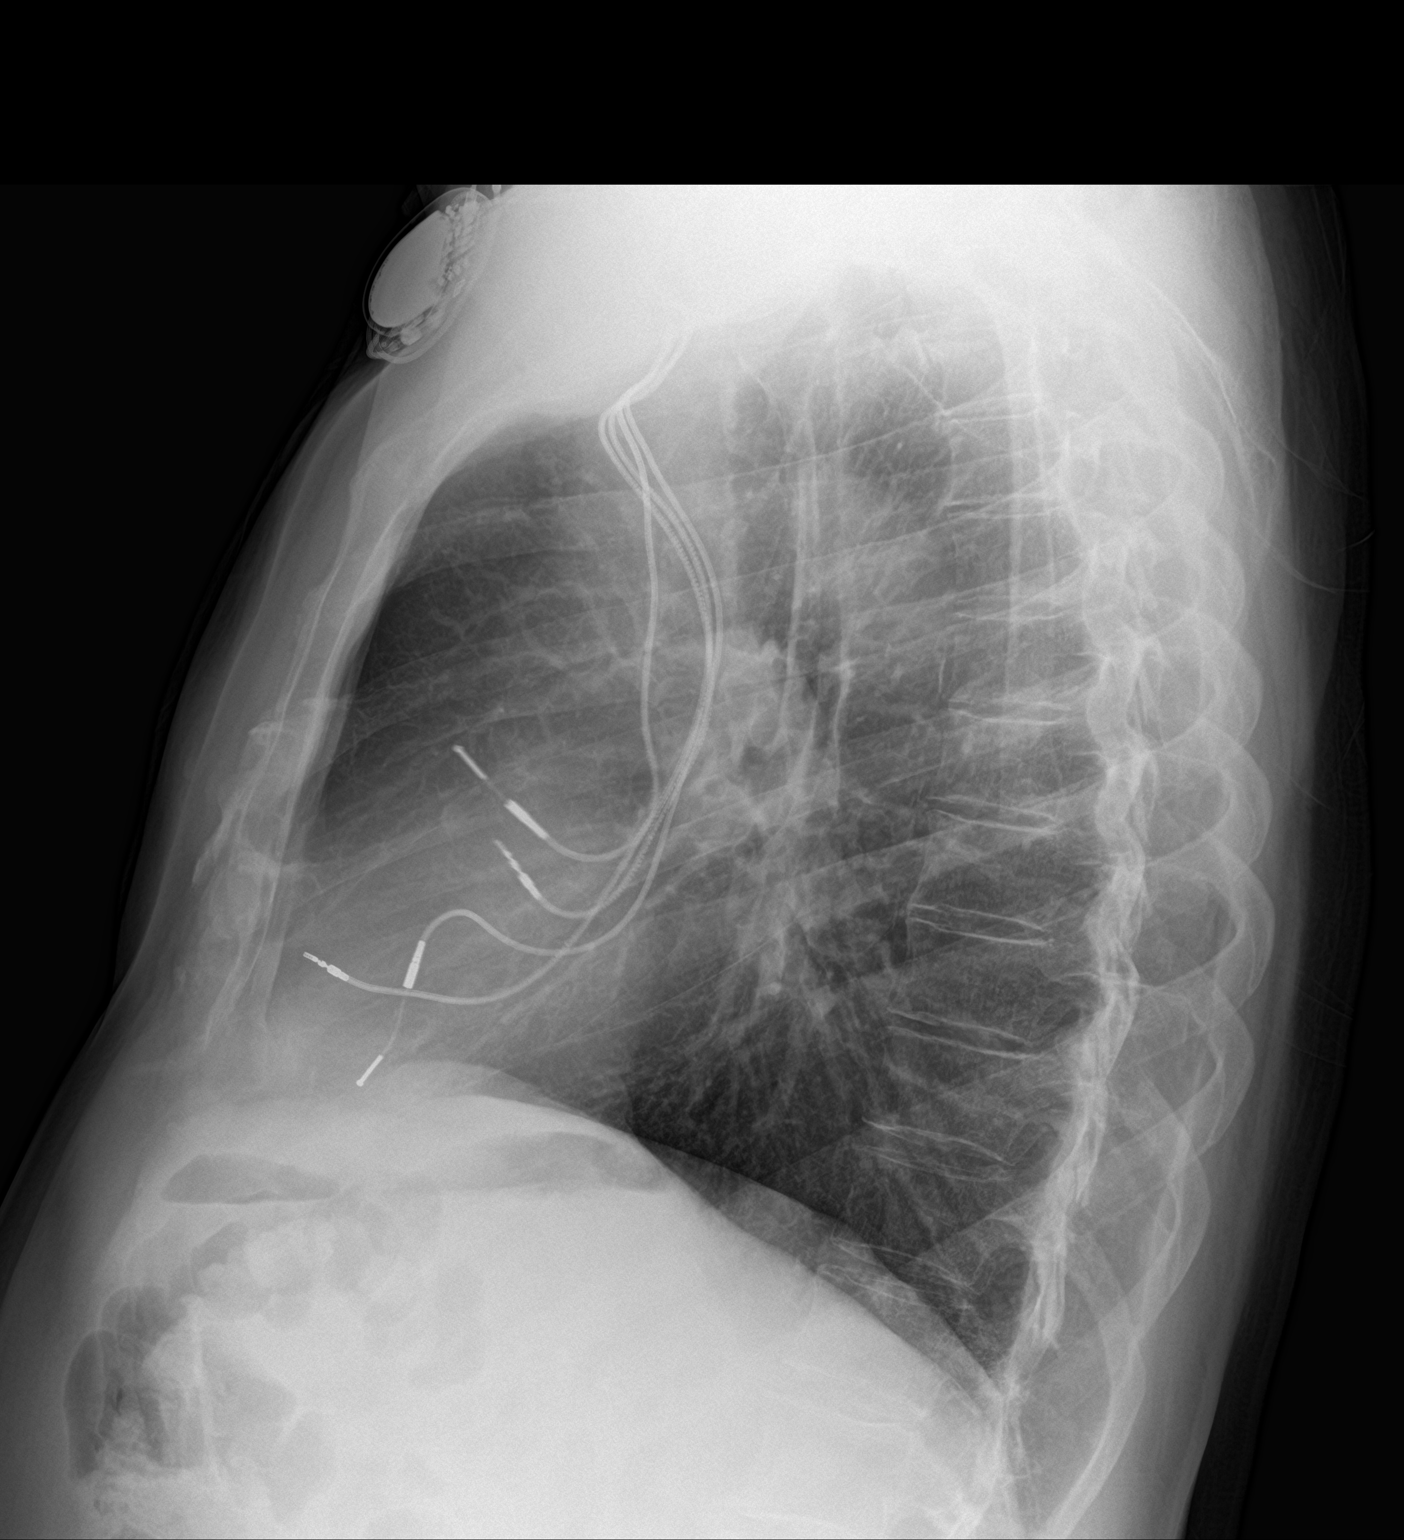

[2 of 2 positions shown; findings below may reference images not displayed]

FINDINGS: Pacemaker leads are attached to the right atrium and right
ventricle. Heart size and pulmonary vascularity are normal. Lungs
are clear. No adenopathy. No pneumothorax. No bone lesions.
IMPRESSION: Lungs clear. Pacemaker leads attached to right atrium and right
ventricle. Heart size normal.

## 2021-04-04 IMAGING — CT CT CHEST W/O CM
1 series · 15 of 34 positions shown, 19 images · non-contrast
Comparison: June 19, 2014.

CLINICAL DATA: Chest pain.

EXAM:
CT CHEST WITHOUT CONTRAST
TECHNIQUE: Multidetector CT imaging of the chest was performed following the
standard protocol without IV contrast.

[Series 2: thorax · axial · 0.72mm/px · z∈[-603,-315]mm · 15 of 170 slices shown, 19 images]
[im 13/170  mediastinal]
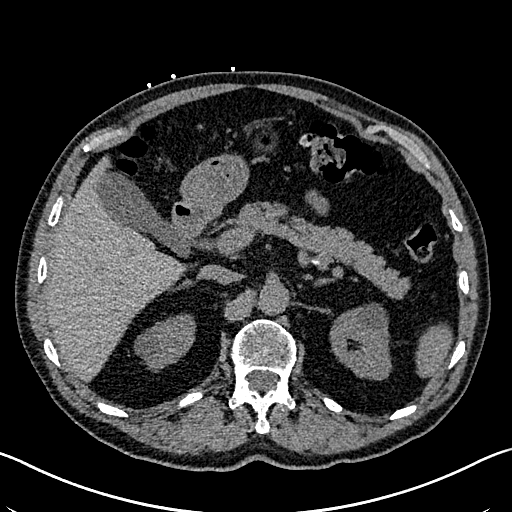
[im 13/170  lung]
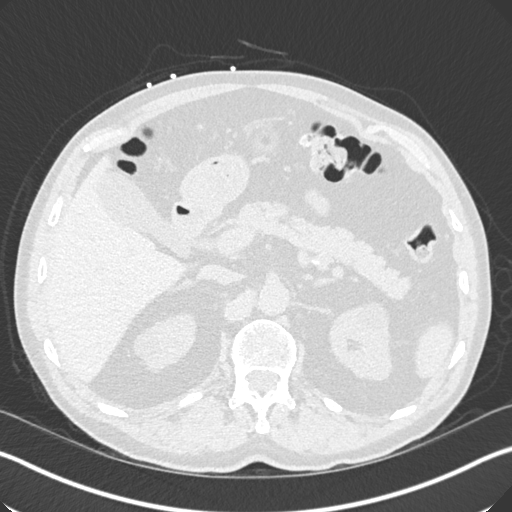
[im 26/170  lung]
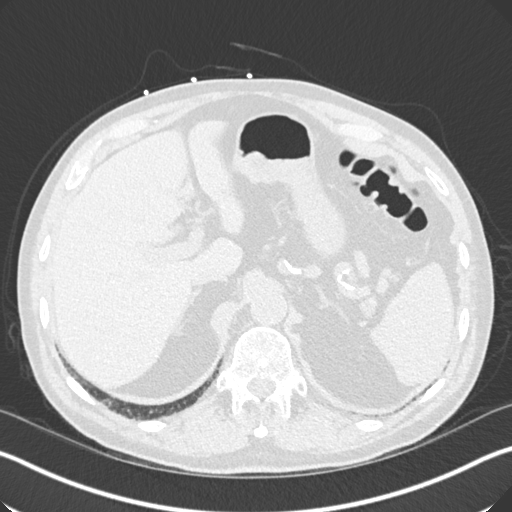
[im 34/170  lung]
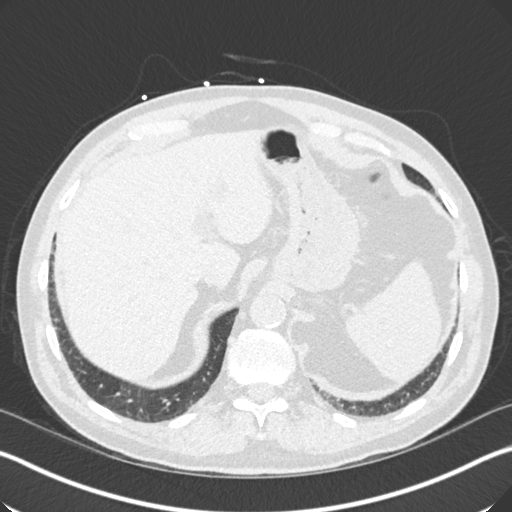
[im 44/170  lung]
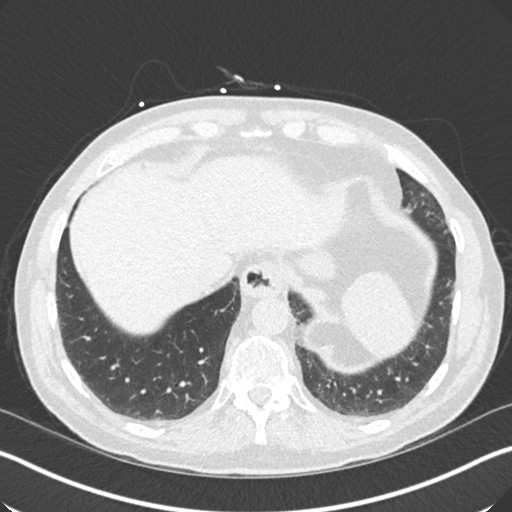
[im 57/170  mediastinal]
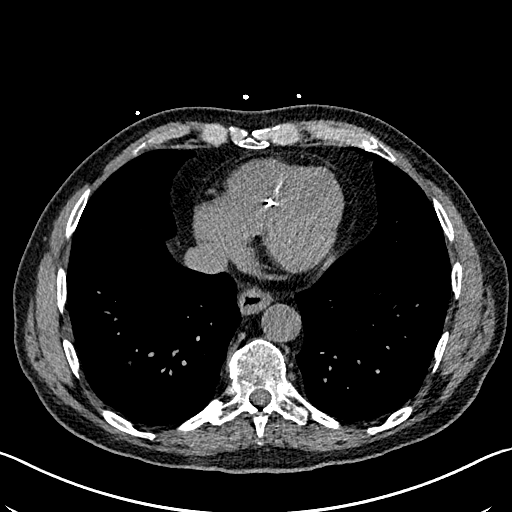
[im 57/170  lung]
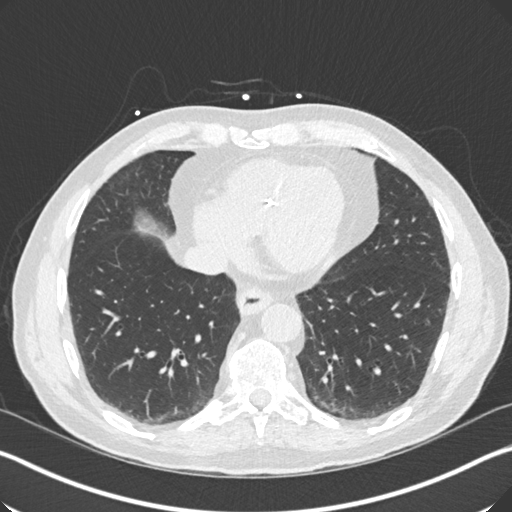
[im 68/170  lung]
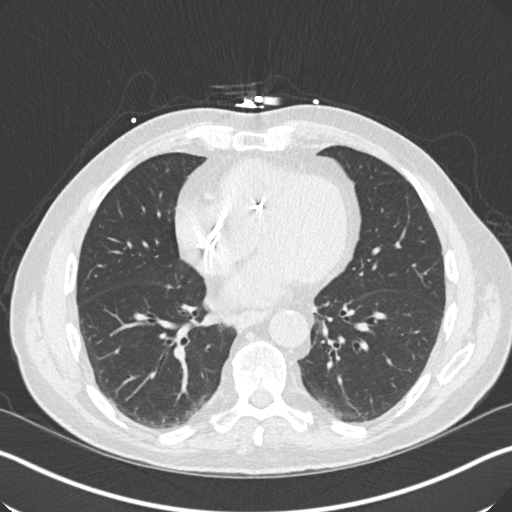
[im 76/170  lung]
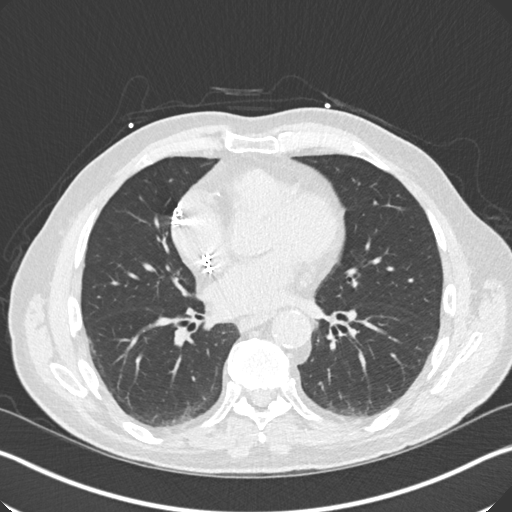
[im 88/170  lung]
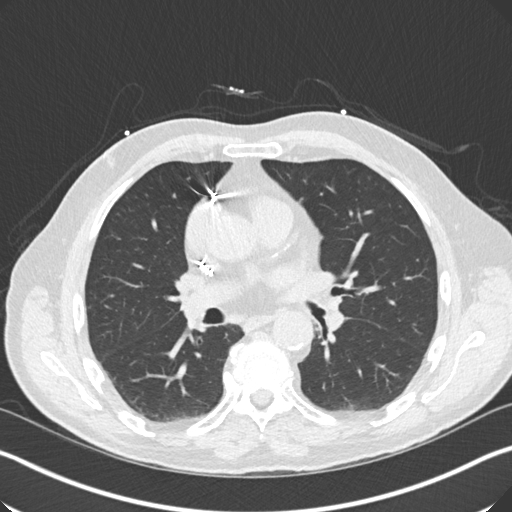
[im 94/170  mediastinal]
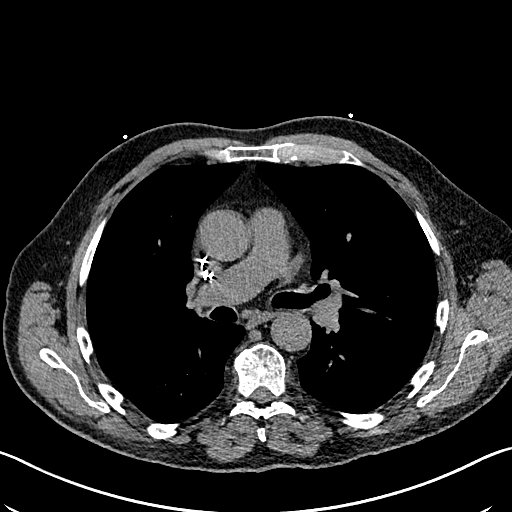
[im 94/170  lung]
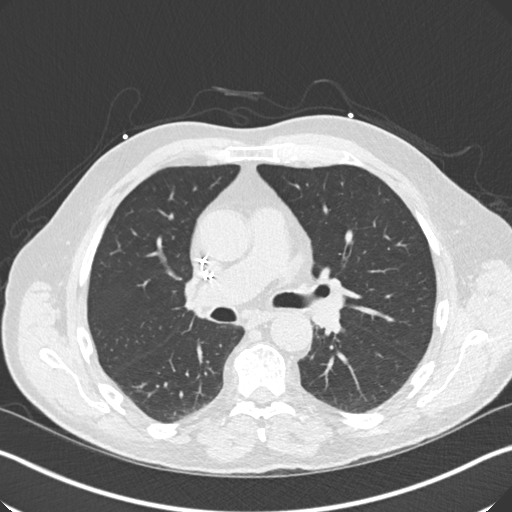
[im 102/170  lung]
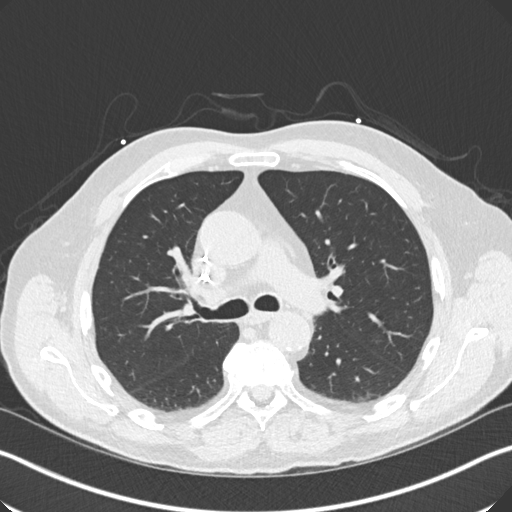
[im 113/170  lung]
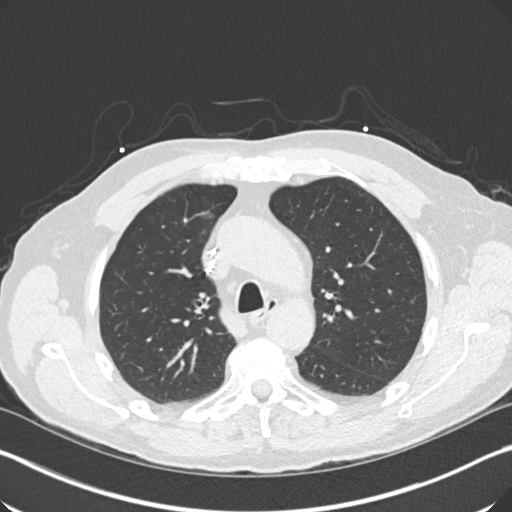
[im 126/170  lung]
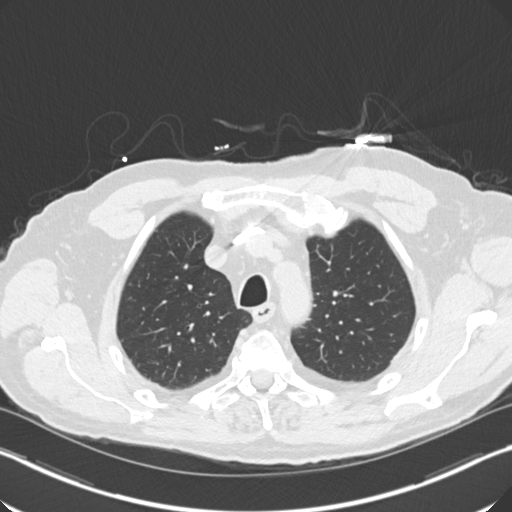
[im 136/170  mediastinal]
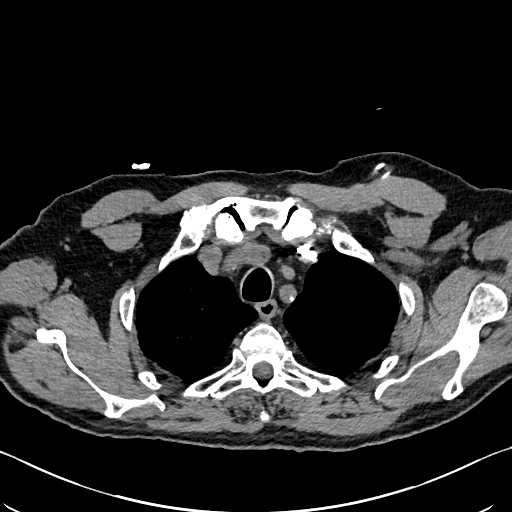
[im 136/170  lung]
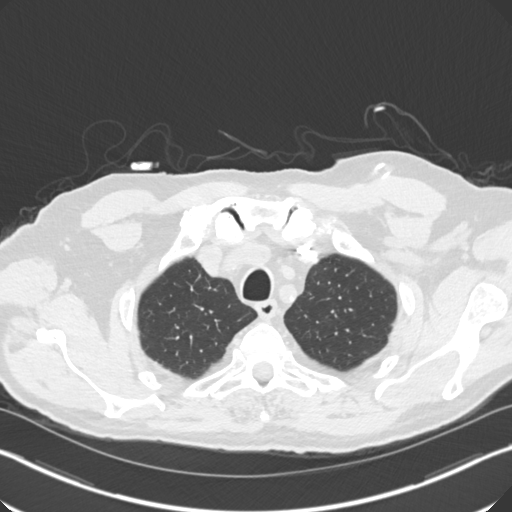
[im 144/170  lung]
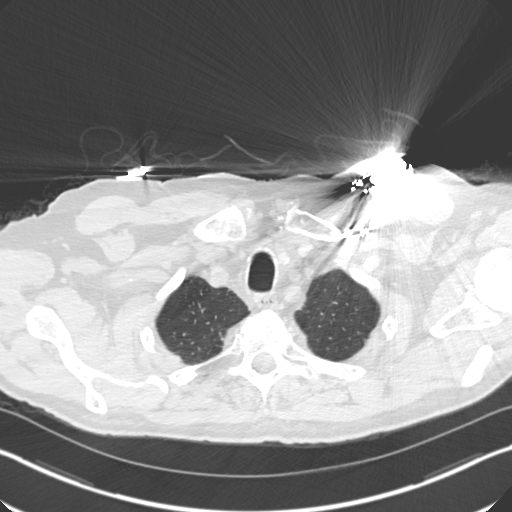
[im 157/170  lung]
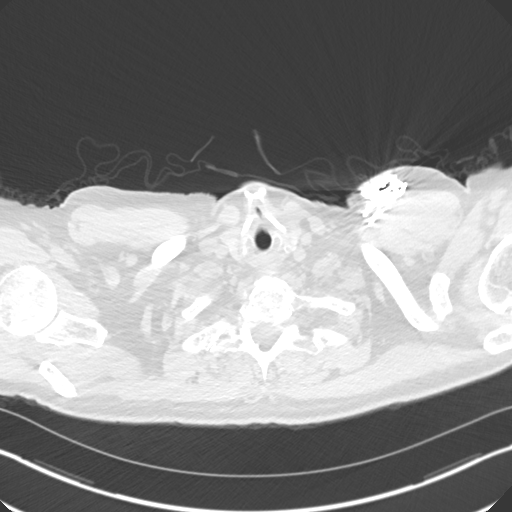

[15 of 34 positions shown; findings below may reference images not displayed]

FINDINGS: Cardiovascular: Atherosclerosis of thoracic aorta is noted without
aneurysm formation. Normal cardiac size. No pericardial effusion.
Left-sided pacemaker is again noted.

Mediastinum/Nodes: No enlarged mediastinal or axillary lymph nodes.
Thyroid gland is unremarkable. Small sliding-type hiatal hernia is
noted.

Lungs/Pleura: No pneumothorax or pleural effusion is noted. 3 mm
nodule is noted in left upper lobe best seen on image number 47 of
series 3. No consolidative process is noted.

Upper Abdomen: No acute abnormality.

Musculoskeletal: No chest wall mass or suspicious bone lesions
identified.
IMPRESSION: 1. Small sliding-type hiatal hernia.
2. 3 mm left upper lobe nodule is noted. No follow-up needed if
patient is low-risk. Non-contrast chest CT can be considered in 12
months if patient is high-risk. This recommendation follows the
consensus statement: Guidelines for Management of Incidental
Pulmonary Nodules Detected on CT Images: From the [HOSPITAL]

Aortic Atherosclerosis (26EAM-OSW.W).

## 2021-04-16 DIAGNOSIS — I5032 Chronic diastolic (congestive) heart failure: Secondary | ICD-10-CM | POA: Diagnosis not present

## 2021-05-07 ENCOUNTER — Other Ambulatory Visit: Payer: Self-pay

## 2021-05-07 ENCOUNTER — Ambulatory Visit
Admission: RE | Admit: 2021-05-07 | Discharge: 2021-05-07 | Disposition: A | Discharge status: Home / Self Care | Payer: HMO | Source: Ambulatory Visit | Attending: Physician Assistant | Admitting: Physician Assistant

## 2021-05-07 ENCOUNTER — Other Ambulatory Visit: Payer: Self-pay | Admitting: Physician Assistant

## 2021-05-07 DIAGNOSIS — I2510 Atherosclerotic heart disease of native coronary artery without angina pectoris: Secondary | ICD-10-CM | POA: Diagnosis not present

## 2021-05-07 DIAGNOSIS — R519 Headache, unspecified: Secondary | ICD-10-CM | POA: Insufficient documentation

## 2021-05-07 DIAGNOSIS — Z950 Presence of cardiac pacemaker: Secondary | ICD-10-CM | POA: Diagnosis not present

## 2021-05-07 DIAGNOSIS — I5032 Chronic diastolic (congestive) heart failure: Secondary | ICD-10-CM | POA: Diagnosis not present

## 2021-05-07 DIAGNOSIS — E782 Mixed hyperlipidemia: Secondary | ICD-10-CM | POA: Diagnosis not present

## 2021-05-07 DIAGNOSIS — I10 Essential (primary) hypertension: Secondary | ICD-10-CM | POA: Diagnosis not present

## 2021-05-07 DIAGNOSIS — R079 Chest pain, unspecified: Secondary | ICD-10-CM | POA: Diagnosis not present

## 2021-05-07 DIAGNOSIS — Z23 Encounter for immunization: Secondary | ICD-10-CM | POA: Diagnosis not present

## 2021-05-07 DIAGNOSIS — N1832 Chronic kidney disease, stage 3b: Secondary | ICD-10-CM | POA: Diagnosis not present

## 2021-05-23 DIAGNOSIS — D485 Neoplasm of uncertain behavior of skin: Secondary | ICD-10-CM | POA: Diagnosis not present

## 2021-05-23 DIAGNOSIS — C44622 Squamous cell carcinoma of skin of right upper limb, including shoulder: Secondary | ICD-10-CM | POA: Diagnosis not present

## 2021-05-23 DIAGNOSIS — D0462 Carcinoma in situ of skin of left upper limb, including shoulder: Secondary | ICD-10-CM | POA: Diagnosis not present

## 2021-05-23 DIAGNOSIS — C44629 Squamous cell carcinoma of skin of left upper limb, including shoulder: Secondary | ICD-10-CM | POA: Diagnosis not present

## 2021-06-04 DIAGNOSIS — E782 Mixed hyperlipidemia: Secondary | ICD-10-CM | POA: Diagnosis not present

## 2021-06-11 DIAGNOSIS — I700 Atherosclerosis of aorta: Secondary | ICD-10-CM | POA: Diagnosis not present

## 2021-06-11 DIAGNOSIS — I5032 Chronic diastolic (congestive) heart failure: Secondary | ICD-10-CM | POA: Diagnosis not present

## 2021-06-11 DIAGNOSIS — E782 Mixed hyperlipidemia: Secondary | ICD-10-CM | POA: Diagnosis not present

## 2021-06-11 DIAGNOSIS — Z125 Encounter for screening for malignant neoplasm of prostate: Secondary | ICD-10-CM | POA: Diagnosis not present

## 2021-07-02 DIAGNOSIS — I495 Sick sinus syndrome: Secondary | ICD-10-CM | POA: Diagnosis not present

## 2021-07-04 DIAGNOSIS — D0461 Carcinoma in situ of skin of right upper limb, including shoulder: Secondary | ICD-10-CM | POA: Diagnosis not present

## 2021-07-04 DIAGNOSIS — C44622 Squamous cell carcinoma of skin of right upper limb, including shoulder: Secondary | ICD-10-CM | POA: Diagnosis not present

## 2021-07-04 DIAGNOSIS — D0462 Carcinoma in situ of skin of left upper limb, including shoulder: Secondary | ICD-10-CM | POA: Diagnosis not present

## 2021-07-07 DIAGNOSIS — I10 Essential (primary) hypertension: Secondary | ICD-10-CM | POA: Diagnosis not present

## 2021-07-07 DIAGNOSIS — Z01818 Encounter for other preprocedural examination: Secondary | ICD-10-CM | POA: Diagnosis not present

## 2021-07-07 DIAGNOSIS — N1832 Chronic kidney disease, stage 3b: Secondary | ICD-10-CM | POA: Diagnosis not present

## 2021-07-07 DIAGNOSIS — Z45010 Encounter for checking and testing of cardiac pacemaker pulse generator [battery]: Secondary | ICD-10-CM | POA: Diagnosis not present

## 2021-07-07 DIAGNOSIS — I495 Sick sinus syndrome: Secondary | ICD-10-CM | POA: Diagnosis not present

## 2021-07-07 DIAGNOSIS — Z950 Presence of cardiac pacemaker: Secondary | ICD-10-CM | POA: Diagnosis not present

## 2021-07-09 DIAGNOSIS — C44629 Squamous cell carcinoma of skin of left upper limb, including shoulder: Secondary | ICD-10-CM | POA: Diagnosis not present

## 2021-07-09 DIAGNOSIS — D0462 Carcinoma in situ of skin of left upper limb, including shoulder: Secondary | ICD-10-CM | POA: Diagnosis not present

## 2021-07-14 DIAGNOSIS — Z4801 Encounter for change or removal of surgical wound dressing: Secondary | ICD-10-CM | POA: Diagnosis not present

## 2021-07-14 DIAGNOSIS — M5481 Occipital neuralgia: Secondary | ICD-10-CM | POA: Diagnosis not present

## 2021-07-14 DIAGNOSIS — R251 Tremor, unspecified: Secondary | ICD-10-CM | POA: Diagnosis not present

## 2021-07-14 DIAGNOSIS — M519 Unspecified thoracic, thoracolumbar and lumbosacral intervertebral disc disorder: Secondary | ICD-10-CM | POA: Diagnosis not present

## 2021-07-16 DIAGNOSIS — T8140XA Infection following a procedure, unspecified, initial encounter: Secondary | ICD-10-CM | POA: Diagnosis not present

## 2021-07-22 DIAGNOSIS — Z45010 Encounter for checking and testing of cardiac pacemaker pulse generator [battery]: Secondary | ICD-10-CM

## 2021-08-07 ENCOUNTER — Encounter: Payer: Self-pay | Admitting: Cardiology

## 2021-08-07 ENCOUNTER — Other Ambulatory Visit: Payer: Self-pay

## 2021-08-07 ENCOUNTER — Ambulatory Visit
Admission: RE | Admit: 2021-08-07 | Discharge: 2021-08-07 | Disposition: A | Discharge status: Home / Self Care | Payer: HMO | Source: Ambulatory Visit | Attending: Cardiology | Admitting: Cardiology

## 2021-08-07 ENCOUNTER — Encounter
Admission: RE | Disposition: A | Discharge status: Home / Self Care | Payer: Self-pay | Source: Ambulatory Visit | Attending: Cardiology

## 2021-08-07 DIAGNOSIS — I428 Other cardiomyopathies: Secondary | ICD-10-CM | POA: Insufficient documentation

## 2021-08-07 DIAGNOSIS — N1832 Chronic kidney disease, stage 3b: Secondary | ICD-10-CM | POA: Insufficient documentation

## 2021-08-07 DIAGNOSIS — Z45010 Encounter for checking and testing of cardiac pacemaker pulse generator [battery]: Secondary | ICD-10-CM

## 2021-08-07 DIAGNOSIS — R001 Bradycardia, unspecified: Secondary | ICD-10-CM | POA: Diagnosis not present

## 2021-08-07 DIAGNOSIS — I5032 Chronic diastolic (congestive) heart failure: Secondary | ICD-10-CM | POA: Diagnosis not present

## 2021-08-07 DIAGNOSIS — G4730 Sleep apnea, unspecified: Secondary | ICD-10-CM | POA: Insufficient documentation

## 2021-08-07 DIAGNOSIS — I130 Hypertensive heart and chronic kidney disease with heart failure and stage 1 through stage 4 chronic kidney disease, or unspecified chronic kidney disease: Secondary | ICD-10-CM | POA: Insufficient documentation

## 2021-08-07 DIAGNOSIS — I495 Sick sinus syndrome: Secondary | ICD-10-CM | POA: Insufficient documentation

## 2021-08-07 DIAGNOSIS — Z87891 Personal history of nicotine dependence: Secondary | ICD-10-CM | POA: Insufficient documentation

## 2021-08-07 DIAGNOSIS — Z79899 Other long term (current) drug therapy: Secondary | ICD-10-CM | POA: Diagnosis not present

## 2021-08-07 DIAGNOSIS — E785 Hyperlipidemia, unspecified: Secondary | ICD-10-CM | POA: Insufficient documentation

## 2021-08-07 DIAGNOSIS — J449 Chronic obstructive pulmonary disease, unspecified: Secondary | ICD-10-CM | POA: Diagnosis not present

## 2021-08-07 DIAGNOSIS — Z01818 Encounter for other preprocedural examination: Secondary | ICD-10-CM

## 2021-08-07 HISTORY — PX: PPM GENERATOR CHANGEOUT: EP1233

## 2021-08-07 SURGERY — PPM GENERATOR CHANGEOUT
Anesthesia: Moderate Sedation

## 2021-08-07 MED ORDER — CEFAZOLIN SODIUM-DEXTROSE 2-4 GM/100ML-% IV SOLN: 2.0000 g | INTRAVENOUS | Status: AC

## 2021-08-07 MED ORDER — LIDOCAINE HCL 1 % IJ SOLN
INTRAMUSCULAR | Status: AC
  Filled 2021-08-07: qty 40

## 2021-08-07 MED ORDER — MIDAZOLAM HCL 2 MG/2ML IJ SOLN
INTRAMUSCULAR | Status: DC | PRN
  Administered 2021-08-07: 1 mg via INTRAVENOUS

## 2021-08-07 MED ORDER — HEPARIN (PORCINE) IN NACL 1000-0.9 UT/500ML-% IV SOLN
INTRAVENOUS | Status: AC
  Filled 2021-08-07: qty 1000

## 2021-08-07 MED ORDER — CEFAZOLIN SODIUM-DEXTROSE 2-4 GM/100ML-% IV SOLN
INTRAVENOUS | Status: AC
  Administered 2021-08-07: 2 g via INTRAVENOUS
  Filled 2021-08-07: qty 100

## 2021-08-07 MED ORDER — CHLORHEXIDINE GLUCONATE CLOTH 2 % EX PADS
6.0000 | MEDICATED_PAD | Freq: Every day | CUTANEOUS | Status: DC
  Administered 2021-08-07: 6 via TOPICAL

## 2021-08-07 MED ORDER — LIDOCAINE HCL (PF) 1 % IJ SOLN
INTRAMUSCULAR | Status: DC | PRN
  Administered 2021-08-07: 40 mL

## 2021-08-07 MED ORDER — CEPHALEXIN 250 MG PO CAPS: 500.0000 mg | ORAL_CAPSULE | Freq: Two times a day (BID) | ORAL | 0 refills | Status: DC

## 2021-08-07 MED ORDER — SODIUM CHLORIDE 0.9 % IV SOLN: INTRAVENOUS | Status: DC

## 2021-08-07 MED ORDER — FENTANYL CITRATE (PF) 100 MCG/2ML IJ SOLN
INTRAMUSCULAR | Status: DC | PRN
  Administered 2021-08-07: 25 ug via INTRAVENOUS

## 2021-08-07 MED ORDER — SODIUM CHLORIDE 0.9 % IV SOLN
80.0000 mg | INTRAVENOUS | Status: AC
  Administered 2021-08-07: 80 mg
  Filled 2021-08-07: qty 2

## 2021-08-07 MED ORDER — FENTANYL CITRATE (PF) 100 MCG/2ML IJ SOLN
INTRAMUSCULAR | Status: AC
  Filled 2021-08-07: qty 2

## 2021-08-07 MED ORDER — HEPARIN SODIUM (PORCINE) 1000 UNIT/ML IJ SOLN
INTRAMUSCULAR | Status: AC
  Filled 2021-08-07: qty 10

## 2021-08-07 MED ORDER — MIDAZOLAM HCL 2 MG/2ML IJ SOLN
INTRAMUSCULAR | Status: AC
  Filled 2021-08-07: qty 2

## 2021-08-07 SURGICAL SUPPLY — 20 items
CABLE SURG 12 DISP A/V CHANNEL (MISCELLANEOUS) ×4 IMPLANT
COVER SURGICAL LIGHT HANDLE (MISCELLANEOUS) ×1 IMPLANT
DEVICE DSSCT PLSMBLD 3.0S LGHT (MISCELLANEOUS) IMPLANT
DRAPE INCISE 23X17 IOBAN STRL (DRAPES) ×1
DRAPE INCISE 23X17 STRL (DRAPES) IMPLANT
DRAPE INCISE IOBAN 23X17 STRL (DRAPES) ×1 IMPLANT
IPG PACE AZUR XT DR MRI W1DR01 (Pacemaker) IMPLANT
KIT SYRINGE INJ CVI SPIKEX1 (MISCELLANEOUS) IMPLANT
KIT WRENCH (KITS) ×1 IMPLANT
PACE AZURE XT DR MRI W1DR01 (Pacemaker) ×2 IMPLANT
PAD ELECT DEFIB RADIOL ZOLL (MISCELLANEOUS) ×1 IMPLANT
PLASMABLADE 3.0S W/LIGHT (MISCELLANEOUS) ×2
POUCH AIGIS-R ANTIBACT PPM (Mesh General) ×2 IMPLANT
POUCH AIGIS-R ANTIBACT PPM MED (Mesh General) IMPLANT
SPONGE XRAY 4X4 16PLY STRL (MISCELLANEOUS) ×2 IMPLANT
SUT VIC AB 2-0 CT1 27 (SUTURE) ×2
SUT VIC AB 2-0 CT1 TAPERPNT 27 (SUTURE) IMPLANT
SUT VIC AB 4-0 PS2 18 (SUTURE) ×1 IMPLANT
SYR CONTROL 10ML ANGIOGRAPHIC (SYRINGE) ×1 IMPLANT
TRAY PACEMAKER INSERTION (PACKS) ×3 IMPLANT

## 2021-08-07 NOTE — Discharge Instructions (Signed)
Patient may shower on 08/10/2021.  Leave Steri-Strips on.

## 2021-08-18 DIAGNOSIS — N1832 Chronic kidney disease, stage 3b: Secondary | ICD-10-CM | POA: Diagnosis not present

## 2021-08-18 DIAGNOSIS — I5032 Chronic diastolic (congestive) heart failure: Secondary | ICD-10-CM | POA: Diagnosis not present

## 2021-08-18 DIAGNOSIS — I495 Sick sinus syndrome: Secondary | ICD-10-CM | POA: Diagnosis not present

## 2021-08-18 DIAGNOSIS — I2510 Atherosclerotic heart disease of native coronary artery without angina pectoris: Secondary | ICD-10-CM | POA: Diagnosis not present

## 2021-08-18 DIAGNOSIS — G4733 Obstructive sleep apnea (adult) (pediatric): Secondary | ICD-10-CM | POA: Diagnosis not present

## 2021-08-18 DIAGNOSIS — Z45010 Encounter for checking and testing of cardiac pacemaker pulse generator [battery]: Secondary | ICD-10-CM | POA: Diagnosis not present

## 2021-08-18 DIAGNOSIS — G250 Essential tremor: Secondary | ICD-10-CM | POA: Diagnosis not present

## 2021-08-18 DIAGNOSIS — I10 Essential (primary) hypertension: Secondary | ICD-10-CM | POA: Diagnosis not present

## 2021-08-18 DIAGNOSIS — J431 Panlobular emphysema: Secondary | ICD-10-CM | POA: Diagnosis not present

## 2021-08-18 DIAGNOSIS — E782 Mixed hyperlipidemia: Secondary | ICD-10-CM | POA: Diagnosis not present

## 2021-08-22 DIAGNOSIS — D2272 Melanocytic nevi of left lower limb, including hip: Secondary | ICD-10-CM | POA: Diagnosis not present

## 2021-08-22 DIAGNOSIS — D2271 Melanocytic nevi of right lower limb, including hip: Secondary | ICD-10-CM | POA: Diagnosis not present

## 2021-08-22 DIAGNOSIS — D485 Neoplasm of uncertain behavior of skin: Secondary | ICD-10-CM | POA: Diagnosis not present

## 2021-08-22 DIAGNOSIS — L570 Actinic keratosis: Secondary | ICD-10-CM | POA: Diagnosis not present

## 2021-08-22 DIAGNOSIS — D225 Melanocytic nevi of trunk: Secondary | ICD-10-CM | POA: Diagnosis not present

## 2021-08-22 DIAGNOSIS — L821 Other seborrheic keratosis: Secondary | ICD-10-CM | POA: Diagnosis not present

## 2021-08-22 DIAGNOSIS — D2261 Melanocytic nevi of right upper limb, including shoulder: Secondary | ICD-10-CM | POA: Diagnosis not present

## 2021-08-22 DIAGNOSIS — D0439 Carcinoma in situ of skin of other parts of face: Secondary | ICD-10-CM | POA: Diagnosis not present

## 2021-08-22 DIAGNOSIS — D2262 Melanocytic nevi of left upper limb, including shoulder: Secondary | ICD-10-CM | POA: Diagnosis not present

## 2021-09-04 DIAGNOSIS — H6123 Impacted cerumen, bilateral: Secondary | ICD-10-CM | POA: Diagnosis not present

## 2021-09-04 DIAGNOSIS — H903 Sensorineural hearing loss, bilateral: Secondary | ICD-10-CM | POA: Diagnosis not present

## 2021-09-24 DIAGNOSIS — I495 Sick sinus syndrome: Secondary | ICD-10-CM | POA: Diagnosis not present

## 2021-11-26 DIAGNOSIS — J431 Panlobular emphysema: Secondary | ICD-10-CM | POA: Diagnosis not present

## 2021-11-26 DIAGNOSIS — I5032 Chronic diastolic (congestive) heart failure: Secondary | ICD-10-CM | POA: Diagnosis not present

## 2021-11-26 DIAGNOSIS — Z950 Presence of cardiac pacemaker: Secondary | ICD-10-CM | POA: Diagnosis present

## 2021-11-26 DIAGNOSIS — I700 Atherosclerosis of aorta: Secondary | ICD-10-CM | POA: Diagnosis not present

## 2021-11-26 DIAGNOSIS — I2510 Atherosclerotic heart disease of native coronary artery without angina pectoris: Secondary | ICD-10-CM | POA: Diagnosis not present

## 2021-11-26 DIAGNOSIS — E782 Mixed hyperlipidemia: Secondary | ICD-10-CM | POA: Diagnosis not present

## 2021-11-26 DIAGNOSIS — R0602 Shortness of breath: Secondary | ICD-10-CM | POA: Diagnosis not present

## 2021-11-26 DIAGNOSIS — I509 Heart failure, unspecified: Secondary | ICD-10-CM | POA: Diagnosis not present

## 2021-11-26 DIAGNOSIS — I495 Sick sinus syndrome: Secondary | ICD-10-CM | POA: Diagnosis not present

## 2021-12-01 DIAGNOSIS — M5481 Occipital neuralgia: Secondary | ICD-10-CM | POA: Diagnosis not present

## 2021-12-01 DIAGNOSIS — G250 Essential tremor: Secondary | ICD-10-CM | POA: Diagnosis not present

## 2021-12-01 DIAGNOSIS — I10 Essential (primary) hypertension: Secondary | ICD-10-CM | POA: Diagnosis not present

## 2021-12-01 DIAGNOSIS — M79605 Pain in left leg: Secondary | ICD-10-CM | POA: Diagnosis not present

## 2021-12-01 DIAGNOSIS — H5461 Unqualified visual loss, right eye, normal vision left eye: Secondary | ICD-10-CM | POA: Diagnosis not present

## 2021-12-03 ENCOUNTER — Other Ambulatory Visit: Payer: Self-pay | Admitting: Neurology

## 2021-12-03 DIAGNOSIS — H5461 Unqualified visual loss, right eye, normal vision left eye: Secondary | ICD-10-CM

## 2021-12-10 ENCOUNTER — Ambulatory Visit: Admission: RE | Admit: 2021-12-10 | Payer: HMO | Source: Ambulatory Visit

## 2021-12-10 DIAGNOSIS — R0602 Shortness of breath: Secondary | ICD-10-CM | POA: Diagnosis not present

## 2021-12-11 DIAGNOSIS — E782 Mixed hyperlipidemia: Secondary | ICD-10-CM | POA: Diagnosis not present

## 2021-12-11 DIAGNOSIS — Z125 Encounter for screening for malignant neoplasm of prostate: Secondary | ICD-10-CM | POA: Diagnosis not present

## 2021-12-15 DIAGNOSIS — Z950 Presence of cardiac pacemaker: Secondary | ICD-10-CM | POA: Diagnosis not present

## 2021-12-15 DIAGNOSIS — G4733 Obstructive sleep apnea (adult) (pediatric): Secondary | ICD-10-CM | POA: Diagnosis not present

## 2021-12-15 DIAGNOSIS — I5032 Chronic diastolic (congestive) heart failure: Secondary | ICD-10-CM | POA: Diagnosis not present

## 2021-12-15 DIAGNOSIS — Z45010 Encounter for checking and testing of cardiac pacemaker pulse generator [battery]: Secondary | ICD-10-CM | POA: Diagnosis not present

## 2021-12-15 DIAGNOSIS — E782 Mixed hyperlipidemia: Secondary | ICD-10-CM | POA: Diagnosis not present

## 2021-12-15 DIAGNOSIS — I2510 Atherosclerotic heart disease of native coronary artery without angina pectoris: Secondary | ICD-10-CM | POA: Diagnosis not present

## 2021-12-15 DIAGNOSIS — I495 Sick sinus syndrome: Secondary | ICD-10-CM | POA: Diagnosis not present

## 2021-12-15 DIAGNOSIS — J431 Panlobular emphysema: Secondary | ICD-10-CM | POA: Diagnosis not present

## 2021-12-15 DIAGNOSIS — I10 Essential (primary) hypertension: Secondary | ICD-10-CM | POA: Diagnosis not present

## 2021-12-15 DIAGNOSIS — I700 Atherosclerosis of aorta: Secondary | ICD-10-CM | POA: Diagnosis not present

## 2021-12-15 DIAGNOSIS — N1832 Chronic kidney disease, stage 3b: Secondary | ICD-10-CM | POA: Diagnosis not present

## 2021-12-22 ENCOUNTER — Ambulatory Visit
Admission: RE | Admit: 2021-12-22 | Discharge: 2021-12-22 | Disposition: A | Discharge status: Home / Self Care | Payer: HMO | Source: Ambulatory Visit | Attending: Neurology | Admitting: Neurology

## 2021-12-22 DIAGNOSIS — H5461 Unqualified visual loss, right eye, normal vision left eye: Secondary | ICD-10-CM | POA: Insufficient documentation

## 2021-12-22 DIAGNOSIS — H538 Other visual disturbances: Secondary | ICD-10-CM | POA: Diagnosis not present

## 2021-12-25 DIAGNOSIS — E782 Mixed hyperlipidemia: Secondary | ICD-10-CM | POA: Diagnosis not present

## 2021-12-25 DIAGNOSIS — Z0000 Encounter for general adult medical examination without abnormal findings: Secondary | ICD-10-CM | POA: Diagnosis not present

## 2021-12-25 DIAGNOSIS — J438 Other emphysema: Secondary | ICD-10-CM | POA: Diagnosis not present

## 2021-12-25 DIAGNOSIS — I700 Atherosclerosis of aorta: Secondary | ICD-10-CM | POA: Diagnosis not present

## 2021-12-25 DIAGNOSIS — G453 Amaurosis fugax: Secondary | ICD-10-CM | POA: Diagnosis not present

## 2021-12-25 DIAGNOSIS — M5116 Intervertebral disc disorders with radiculopathy, lumbar region: Secondary | ICD-10-CM | POA: Diagnosis not present

## 2021-12-25 DIAGNOSIS — I5032 Chronic diastolic (congestive) heart failure: Secondary | ICD-10-CM | POA: Diagnosis not present

## 2021-12-25 DIAGNOSIS — N1832 Chronic kidney disease, stage 3b: Secondary | ICD-10-CM | POA: Diagnosis not present

## 2022-01-14 DIAGNOSIS — I5032 Chronic diastolic (congestive) heart failure: Secondary | ICD-10-CM | POA: Diagnosis not present

## 2022-01-14 DIAGNOSIS — M5116 Intervertebral disc disorders with radiculopathy, lumbar region: Secondary | ICD-10-CM | POA: Diagnosis not present

## 2022-01-22 DIAGNOSIS — G453 Amaurosis fugax: Secondary | ICD-10-CM | POA: Diagnosis not present

## 2022-01-22 DIAGNOSIS — I6523 Occlusion and stenosis of bilateral carotid arteries: Secondary | ICD-10-CM | POA: Diagnosis not present

## 2022-01-29 ENCOUNTER — Other Ambulatory Visit: Payer: Self-pay | Admitting: Internal Medicine

## 2022-01-29 DIAGNOSIS — M5116 Intervertebral disc disorders with radiculopathy, lumbar region: Secondary | ICD-10-CM

## 2022-02-11 ENCOUNTER — Emergency Department: Payer: HMO

## 2022-02-11 ENCOUNTER — Encounter: Payer: Self-pay | Admitting: Internal Medicine

## 2022-02-11 ENCOUNTER — Inpatient Hospital Stay
Admission: EM | Admit: 2022-02-11 | Discharge: 2022-02-18 | DRG: 300 | Disposition: A | Discharge status: Home / Self Care | Payer: HMO | Attending: Family Medicine | Admitting: Family Medicine

## 2022-02-11 ENCOUNTER — Other Ambulatory Visit: Payer: Self-pay

## 2022-02-11 DIAGNOSIS — N179 Acute kidney failure, unspecified: Secondary | ICD-10-CM | POA: Diagnosis present

## 2022-02-11 DIAGNOSIS — I2510 Atherosclerotic heart disease of native coronary artery without angina pectoris: Secondary | ICD-10-CM | POA: Diagnosis not present

## 2022-02-11 DIAGNOSIS — I82409 Acute embolism and thrombosis of unspecified deep veins of unspecified lower extremity: Secondary | ICD-10-CM | POA: Diagnosis present

## 2022-02-11 DIAGNOSIS — I495 Sick sinus syndrome: Secondary | ICD-10-CM | POA: Diagnosis present

## 2022-02-11 DIAGNOSIS — N1832 Chronic kidney disease, stage 3b: Secondary | ICD-10-CM | POA: Diagnosis not present

## 2022-02-11 DIAGNOSIS — G250 Essential tremor: Secondary | ICD-10-CM | POA: Diagnosis present

## 2022-02-11 DIAGNOSIS — D696 Thrombocytopenia, unspecified: Secondary | ICD-10-CM

## 2022-02-11 DIAGNOSIS — I824Y2 Acute embolism and thrombosis of unspecified deep veins of left proximal lower extremity: Secondary | ICD-10-CM | POA: Diagnosis not present

## 2022-02-11 DIAGNOSIS — I10 Essential (primary) hypertension: Secondary | ICD-10-CM | POA: Diagnosis not present

## 2022-02-11 DIAGNOSIS — H538 Other visual disturbances: Secondary | ICD-10-CM | POA: Diagnosis present

## 2022-02-11 DIAGNOSIS — Z6824 Body mass index (BMI) 24.0-24.9, adult: Secondary | ICD-10-CM

## 2022-02-11 DIAGNOSIS — M1A9XX0 Chronic gout, unspecified, without tophus (tophi): Secondary | ICD-10-CM | POA: Diagnosis not present

## 2022-02-11 DIAGNOSIS — R519 Headache, unspecified: Secondary | ICD-10-CM | POA: Diagnosis not present

## 2022-02-11 DIAGNOSIS — N184 Chronic kidney disease, stage 4 (severe): Secondary | ICD-10-CM | POA: Diagnosis not present

## 2022-02-11 DIAGNOSIS — Z9849 Cataract extraction status, unspecified eye: Secondary | ICD-10-CM | POA: Diagnosis not present

## 2022-02-11 DIAGNOSIS — D693 Immune thrombocytopenic purpura: Secondary | ICD-10-CM | POA: Diagnosis not present

## 2022-02-11 DIAGNOSIS — G4733 Obstructive sleep apnea (adult) (pediatric): Secondary | ICD-10-CM | POA: Diagnosis not present

## 2022-02-11 DIAGNOSIS — I130 Hypertensive heart and chronic kidney disease with heart failure and stage 1 through stage 4 chronic kidney disease, or unspecified chronic kidney disease: Secondary | ICD-10-CM | POA: Diagnosis not present

## 2022-02-11 DIAGNOSIS — I82402 Acute embolism and thrombosis of unspecified deep veins of left lower extremity: Secondary | ICD-10-CM | POA: Diagnosis not present

## 2022-02-11 DIAGNOSIS — M109 Gout, unspecified: Secondary | ICD-10-CM | POA: Diagnosis not present

## 2022-02-11 DIAGNOSIS — E440 Moderate protein-calorie malnutrition: Secondary | ICD-10-CM | POA: Diagnosis not present

## 2022-02-11 DIAGNOSIS — I824Z2 Acute embolism and thrombosis of unspecified deep veins of left distal lower extremity: Secondary | ICD-10-CM | POA: Diagnosis present

## 2022-02-11 DIAGNOSIS — I824Z9 Acute embolism and thrombosis of unspecified deep veins of unspecified distal lower extremity: Secondary | ICD-10-CM

## 2022-02-11 DIAGNOSIS — J449 Chronic obstructive pulmonary disease, unspecified: Secondary | ICD-10-CM | POA: Diagnosis present

## 2022-02-11 DIAGNOSIS — N189 Chronic kidney disease, unspecified: Secondary | ICD-10-CM

## 2022-02-11 DIAGNOSIS — Z79899 Other long term (current) drug therapy: Secondary | ICD-10-CM | POA: Diagnosis not present

## 2022-02-11 DIAGNOSIS — E860 Dehydration: Secondary | ICD-10-CM | POA: Diagnosis not present

## 2022-02-11 DIAGNOSIS — Z888 Allergy status to other drugs, medicaments and biological substances status: Secondary | ICD-10-CM | POA: Diagnosis not present

## 2022-02-11 DIAGNOSIS — Z950 Presence of cardiac pacemaker: Secondary | ICD-10-CM | POA: Diagnosis present

## 2022-02-11 DIAGNOSIS — I5032 Chronic diastolic (congestive) heart failure: Secondary | ICD-10-CM | POA: Diagnosis not present

## 2022-02-11 DIAGNOSIS — I700 Atherosclerosis of aorta: Secondary | ICD-10-CM | POA: Diagnosis not present

## 2022-02-11 DIAGNOSIS — Z7982 Long term (current) use of aspirin: Secondary | ICD-10-CM

## 2022-02-11 DIAGNOSIS — R079 Chest pain, unspecified: Secondary | ICD-10-CM | POA: Diagnosis not present

## 2022-02-11 DIAGNOSIS — R0602 Shortness of breath: Secondary | ICD-10-CM

## 2022-02-11 DIAGNOSIS — I82432 Acute embolism and thrombosis of left popliteal vein: Principal | ICD-10-CM | POA: Diagnosis present

## 2022-02-11 DIAGNOSIS — N183 Chronic kidney disease, stage 3 unspecified: Secondary | ICD-10-CM | POA: Diagnosis present

## 2022-02-11 DIAGNOSIS — I82442 Acute embolism and thrombosis of left tibial vein: Secondary | ICD-10-CM | POA: Diagnosis not present

## 2022-02-11 LAB — BLOOD GAS, ARTERIAL
Acid-base deficit: 2.1 mmol/L — ABNORMAL HIGH (ref 0.0–2.0)
Bicarbonate: 21.1 mmol/L (ref 20.0–28.0)
O2 Content: 2 L/min
O2 Saturation: 95.8 %
Patient temperature: 37
pCO2 arterial: 31 mmHg — ABNORMAL LOW (ref 32–48)
pH, Arterial: 7.44 (ref 7.35–7.45)
pO2, Arterial: 104 mmHg (ref 83–108)

## 2022-02-11 LAB — COMPREHENSIVE METABOLIC PANEL
ALT: 22 U/L (ref 0–44)
AST: 30 U/L (ref 15–41)
Albumin: 3.2 g/dL — ABNORMAL LOW (ref 3.5–5.0)
Alkaline Phosphatase: 70 U/L (ref 38–126)
Anion gap: 9 (ref 5–15)
BUN: 47 mg/dL — ABNORMAL HIGH (ref 8–23)
CO2: 25 mmol/L (ref 22–32)
Calcium: 8.8 mg/dL — ABNORMAL LOW (ref 8.9–10.3)
Chloride: 108 mmol/L (ref 98–111)
Creatinine, Ser: 2.46 mg/dL — ABNORMAL HIGH (ref 0.61–1.24)
GFR, Estimated: 25 mL/min — ABNORMAL LOW (ref >60)
Glucose, Bld: 116 mg/dL — ABNORMAL HIGH (ref 70–99)
Potassium: 4.2 mmol/L (ref 3.5–5.1)
Sodium: 142 mmol/L (ref 135–145)
Total Bilirubin: 1.6 mg/dL — ABNORMAL HIGH (ref 0.3–1.2)
Total Protein: 6.2 g/dL — ABNORMAL LOW (ref 6.5–8.1)

## 2022-02-11 LAB — CBC
HCT: 45.8 % (ref 39.0–52.0)
Hemoglobin: 15.2 g/dL (ref 13.0–17.0)
MCH: 31.8 pg (ref 26.0–34.0)
MCHC: 33.2 g/dL (ref 30.0–36.0)
MCV: 95.8 fL (ref 80.0–100.0)
Platelets: 53 10*3/uL — ABNORMAL LOW (ref 150–400)
RBC: 4.78 MIL/uL (ref 4.22–5.81)
RDW: 12.6 % (ref 11.5–15.5)
WBC: 9.9 10*3/uL (ref 4.0–10.5)
nRBC: 0 % (ref 0.0–0.2)

## 2022-02-11 LAB — PROTIME-INR
INR: 1.2 (ref 0.8–1.2)
Prothrombin Time: 15.1 seconds (ref 11.4–15.2)

## 2022-02-11 LAB — APTT: aPTT: 31 seconds (ref 24–36)

## 2022-02-11 LAB — HEMOGLOBIN: Hemoglobin: 15.1 g/dL (ref 13.0–17.0)

## 2022-02-11 LAB — BRAIN NATRIURETIC PEPTIDE: B Natriuretic Peptide: 414.7 pg/mL — ABNORMAL HIGH (ref 0.0–100.0)

## 2022-02-11 LAB — TROPONIN I (HIGH SENSITIVITY): Troponin I (High Sensitivity): 14 ng/L (ref <18)

## 2022-02-11 MED ORDER — HEPARIN (PORCINE) 25000 UT/250ML-% IV SOLN
450.0000 [IU]/h | INTRAVENOUS | Status: DC
Start: 2022-02-11 — End: 2022-02-18
  Administered 2022-02-11: 1200 [IU]/h via INTRAVENOUS
  Administered 2022-02-12: 1000 [IU]/h via INTRAVENOUS
  Administered 2022-02-13: 850 [IU]/h via INTRAVENOUS
  Administered 2022-02-15 – 2022-02-18 (×2): 450 [IU]/h via INTRAVENOUS
  Filled 2022-02-11 (×5): qty 250

## 2022-02-11 MED ORDER — SODIUM CHLORIDE 0.9 % IV SOLN: Freq: Once | INTRAVENOUS | Status: AC

## 2022-02-11 MED ORDER — ONDANSETRON HCL 4 MG/2ML IJ SOLN: 4.0000 mg | Freq: Four times a day (QID) | INTRAMUSCULAR | Status: DC | PRN

## 2022-02-11 MED ORDER — ALBUTEROL SULFATE (2.5 MG/3ML) 0.083% IN NEBU: 2.5000 mg | INHALATION_SOLUTION | RESPIRATORY_TRACT | Status: DC | PRN

## 2022-02-11 MED ORDER — ACETAMINOPHEN 650 MG RE SUPP: 650.0000 mg | Freq: Four times a day (QID) | RECTAL | Status: DC | PRN

## 2022-02-11 MED ORDER — ACETAMINOPHEN 325 MG PO TABS: 650.0000 mg | ORAL_TABLET | Freq: Four times a day (QID) | ORAL | Status: DC | PRN

## 2022-02-11 MED ORDER — ONDANSETRON HCL 4 MG PO TABS: 4.0000 mg | ORAL_TABLET | Freq: Four times a day (QID) | ORAL | Status: DC | PRN

## 2022-02-11 NOTE — ED Triage Notes (Signed)
Pt here from Dr. Ammie Ferrier office c/o SOB x2 weeks. Pt states he was recently dx with CHF and started on a diuretic. Pt also recently started on gabapentin. Pt has some swelling to the left foot.

## 2022-02-11 NOTE — ED Provider Notes (Signed)
[facility] Provider Note    Event Date/Time   First MD Initiated Contact with Patient 02/11/22 1609     (approximate)   History   Shortness of Breath   HPI  Darren Gonzales is a 86 y.o. male  who presents to the emergency department today because of concern for shortness of breath.  The patient states that has been going on for slightly over 2 weeks.  He notices worse when he tries to lie flat.  In addition the patient has felt increasing fatigue, chills.  He did think that this was related to gabapentin which she recently started about a month ago.  He is in fact talk to his doctor has advised him to stop the gabapentin.  Furthermore the patient has noticed some left leg swelling for roughly 2-1/2 weeks as well.  Has been accompanied by some pain in the left leg.     Physical Exam   Triage Vital Signs: ED Triage Vitals  Enc Vitals Group     BP 02/11/22 1253 110/69     Pulse Rate 02/11/22 1253 (!) 59     Resp 02/11/22 1253 18     Temp 02/11/22 1253 97.8 F (36.6 C)     Temp Source 02/11/22 1253 Axillary     SpO2 02/11/22 1253 98 %     Weight 02/11/22 1254 184 lb 1.4 oz (83.5 kg)     Height 02/11/22 1254 '5\' 9"'$  (1.753 m)     Head Circumference --      Peak Flow --      Pain Score 02/11/22 1254 0     Pain Loc --      Pain Edu? --      Excl. in [place]? --     Most recent vital signs: Vitals:   02/11/22 1253  BP: 110/69  Pulse: (!) 59  Resp: 18  Temp: 97.8 F (36.6 C)  SpO2: 98%    General: Awake, alert, oriented. CV:  Good peripheral perfusion. Regular rate and rhythm. Resp:  Normal effort. Lungs clear. Abd:  No distention.  Other:  Left leg with pitting edema. No erythema or warmth.    ED Results / Procedures / Treatments   Labs (all labs ordered are listed, but only abnormal results are displayed) Labs Reviewed  CBC - Abnormal; Notable for the following components:      Result Value   Platelets 53 (*)    All other components  within normal limits  COMPREHENSIVE METABOLIC PANEL - Abnormal; Notable for the following components:   Glucose, Bld 116 (*)    BUN 47 (*)    Creatinine, Ser 2.46 (*)    Calcium 8.8 (*)    Total Protein 6.2 (*)    Albumin 3.2 (*)    Total Bilirubin 1.6 (*)    GFR, Estimated 25 (*)    All other components within normal limits  BRAIN NATRIURETIC PEPTIDE - Abnormal; Notable for the following components:   B Natriuretic Peptide 414.7 (*)    All other components within normal limits     EKG  I, Nance Pear, attending physician, personally viewed and interpreted this EKG  EKG Time: 1247 Rate: 63 Rhythm: atrial paced rhythm Axis: left axis deviation Intervals: qtc 413 QRS: narrow ST changes: no st elevation Impression: abnormal ekg   RADIOLOGY I independently interpreted and visualized the CXR. My interpretation: No pneumonia. No pneumothorax.  Radiology interpretation:  IMPRESSION:  There are no signs of pulmonary  edema or focal pulmonary  consolidation. Blunting of right lateral CP angle may suggest  minimal effusion or pleural thickening.      PROCEDURES:  Critical Care performed: Yes  Procedures  CRITICAL CARE Performed by: Nance Pear   Total critical care time: 30 minutes  Critical care time was exclusive of separately billable procedures and treating other patients.  Critical care was necessary to treat or prevent imminent or life-threatening deterioration.  Critical care was time spent personally by me on the following activities: development of treatment plan with patient and/or surrogate as well as nursing, discussions with consultants, evaluation of patient's response to treatment, examination of patient, obtaining history from patient or surrogate, ordering and performing treatments and interventions, ordering and review of laboratory studies, ordering and review of radiographic studies, pulse oximetry and re-evaluation of patient's  condition.   MEDICATIONS ORDERED IN ED: Medications - No data to display   IMPRESSION / MDM / [place] / ED COURSE  I reviewed the triage vital signs and the nursing notes.                              Differential diagnosis includes, but is not limited to, CHF, blood clot, pneumonia, pneumothorax.  Patient's presentation is most consistent with acute presentation with potential threat to life or bodily function.  Patient presents today with primary concern for shortness of breath. Also complaining of left leg swelling. [place] was positive for DVT. Blood work shows thrombocytopenia. Discussed with Dr. Lucky Cowboy with vascular surgery. At this time will plan on starting heparin drip but holding off on bolus. Given concern for PE v/q scan was ordered given poor GFR. Discussed with Dr. Marcello Moores with the hospitalist service who will plan on admission.   FINAL CLINICAL IMPRESSION(S) / ED DIAGNOSES   Final diagnoses:  Acute deep vein thrombosis (DVT) of proximal vein of left lower extremity (HCC)  Shortness of breath     Note:  This document was prepared using Dragon voice recognition software and may include unintentional dictation errors.    Nance Pear, MD 02/11/22 [phone]

## 2022-02-11 NOTE — Consult Note (Signed)
[name] for IV Heparin Indication: DVT / PE  Patient Measurements: Height: '5\' 9"'$  (175.3 cm) Weight: 83.5 kg (184 lb 1.4 oz) IBW/kg (Calculated) : 70.7 Heparin Dosing Weight: 83.5  Labs: Recent Labs    02/11/22 1255  HGB 15.2  HCT 45.8  PLT 53*  CREATININE 2.46*  TROPONINIHS 14    Estimated Creatinine Clearance: 22.8 mL/min (A) (by C-G formula based on SCr of 2.46 mg/dL (H)).   Medical History: Past Medical History:  Diagnosis Date   Arthritis    Cancer ([place])    COPD (chronic obstructive pulmonary disease) (HCC)    Hoarseness of voice    Hypertension    MRSA (methicillin resistant staph aureus) culture positive    Sick sinus syndrome (HCC)    Sleep apnea    Tremor     Medications:  No anticoagulation prior to admission per my chart review  Assessment: Patient is a 86 y/o M with medical history as above who presented to the ED 8/23 from PCP office with SOB x 2 weeks. Reported recent diastolic CHF diagnosis and diuretic initiation. Endorsing left foot swelling. Patient subsequently found to have LLE DVT. Pharmacy consulted to initiate and manage heparin infusion for VTE treatment.  Baseline CBC notable for thrombocytopenia (platelet count 53k). Baseline aPTT and PT-INR are pending.   Goal of Therapy:  Heparin level 0.3 - 0.5 units/ml (no bolus dosing given thrombocytopenia) Monitor platelets by anticoagulation protocol: Yes   Plan:  --Start heparin infusion at 1200 units/hr, no bolus --HL 8 hours after initiation of infusion --Daily CBC per protocol while on IV heparin  Darren Gonzales 02/11/2022,6:16 PM

## 2022-02-11 NOTE — H&P (Signed)
History and Physical    Darren Gonzales YQM:[id number] DOB: 31-Aug-1935 DOA: 02/11/2022  PCP: Rusty Aus, MD  Patient coming from: PCP office   I have personally briefly reviewed patient's old medical records in [place]  Chief Complaint: sob x 2 weeks as well as lower ext swelling   HPI: Darren Gonzales is a 86 y.o. male with medical history significant of  COPD, HTN, SSS s/p pacemaker,  OSA intolerant to CPAP ,CHFpef ( 50-55%), CKDIII,Gout, who presents from  PCP office with complaints of 2 weeks of persistent sob with associated lower extremity swelling. He has also noted associated orthopnea, fatigue, as well chest pain unable to describe but notes its intermittent.   On further ros no abdominal pain, n/v/  but notes chills, as well as coughing.   He also notes some side effect of gabapentin and was told to hold this medication per PCP as well as hold his amlodipine.   ED Course:  Vitals:afeb, bp 110/69-98/87(118/760), hr 59, sat 98% on ra rr28 Labs: NA 142, K 4.2, cL 108, Glu 116, bun 47, CR 2.46, gfr 25( prior cr 1.9 base) Wbc: 9/9, hgb 15.2, pt 53 ( 153 ( 2021) BNP 414.7 Ce14 BMW:UXLKG are no signs of pulmonary edema or focal pulmonary consolidation. Blunting of right lateral CP angle may suggest minimal effusion or pleural thickening. Review of Systems: As per HPI otherwise 10 point review of systems negative.   Past Medical History:  Diagnosis Date   Arthritis    Cancer ([place])    COPD (chronic obstructive pulmonary disease) (HCC)    Hoarseness of voice    Hypertension    MRSA (methicillin resistant staph aureus) culture positive    Sick sinus syndrome ([place])    Sleep apnea    Tremor     Past Surgical History:  Procedure Laterality Date   CATARACT EXTRACTION     COLONOSCOPY WITH PROPOFOL N/A 01/15/2016   Procedure: COLONOSCOPY WITH PROPOFOL;  Surgeon: Manya Silvas, MD;  Location: [facility] ENDOSCOPY;  Service: Endoscopy;  Laterality: N/A;    ESOPHAGOGASTRODUODENOSCOPY (EGD) WITH PROPOFOL N/A 01/15/2016   Procedure: ESOPHAGOGASTRODUODENOSCOPY (EGD) WITH PROPOFOL;  Surgeon: Manya Silvas, MD;  Location: [facility] ENDOSCOPY;  Service: Endoscopy;  Laterality: N/A;   INSERT / REPLACE / REMOVE PACEMAKER     POSTERIOR LAMINECTOMY / DECOMPRESSION LUMBAR SPINE     PPM GENERATOR CHANGEOUT N/A 08/07/2021   Procedure: PPM GENERATOR CHANGEOUT;  Surgeon: Isaias Cowman, MD;  Location: [place] CV LAB;  Service: Cardiovascular;  Laterality: N/A;     reports that he has quit smoking. He has never used smokeless tobacco. He reports that he does not drink alcohol and does not use drugs.  Allergies  Allergen Reactions   Lipitor [Atorvastatin] Other (See Comments)    Muscle pain   Family Hiistory: COPD Mother  cotton inhalation exposure  Myocardial Infarction (Heart attack) Mother  Stroke Mother  Diabetes Mother  High blood pressure (Hypertension) Mother  Coronary Artery Disease (Blocked arteries around heart) Mother  ALS Father  Myocardial Infarction (Heart attack) Father  Coronary Artery Disease (Blocked arteries around heart) Brother  post CABG  Brain hemorrhage Brother  Myocardial Infarction (Heart attack) Brother  High blood pressure (Hypertension) Brother    Prior to Admission medications   Medication Sig Start Date End Date Taking? Authorizing Provider  acetaminophen (TYLENOL) 325 MG tablet Take 650 mg by mouth every 6 (six) hours as needed for mild pain, moderate pain, fever or headache.  [provider]  amLODipine (NORVASC) 5 MG tablet Take 5 mg by mouth daily.    [provider]  aspirin 81 MG tablet Take 81 mg by mouth every other day.     [provider]  Biotin 1 MG CAPS Take 1 mg by mouth daily.    [provider]  cephALEXin (KEFLEX) 250 MG capsule Take 2 capsules (500 mg total) by mouth 2 (two) times daily. 08/07/21   Paraschos, Alexander, MD  Cholecalciferol 1000 units  tablet Take 1,000 Units by mouth daily.    [provider]  colchicine 0.6 MG tablet Take 0.6 mg by mouth daily as needed (Gout). 08/09/19   [provider]  isosorbide mononitrate (IMDUR) 30 MG 24 hr tablet Take 30 mg by mouth daily. 10/11/19   [provider]  omeprazole (PRILOSEC) 40 MG capsule Take 40 mg by mouth every other day. 05/02/21   [provider]  pravastatin (PRAVACHOL) 20 MG tablet Take 20 mg by mouth at bedtime.     [provider]  primidone (MYSOLINE) 50 MG tablet Take 100 mg by mouth 2 (two) times daily. 10/18/19   [provider]  propranolol ER (INDERAL LA) 120 MG 24 hr capsule Take 120 mg by mouth daily. Take with 60 mg for a total 180 mg Daily 09/12/19   [provider]  propranolol ER (INDERAL LA) 60 MG 24 hr capsule Take 60 mg by mouth daily. Take with 120 mg for a total of 180 mg daily 05/30/21   [provider]    Physical Exam: Vitals:   02/11/22 1254 02/11/22 1630 02/11/22 1700 02/11/22 1730  BP:  98/87 109/71 107/76  Pulse:  (!) 58 63 60  Resp:   (!) 28 (!) 26  Temp:      TempSrc:      SpO2:  99% 97% 99%  Weight: 83.5 kg     Height: '5\' 9"'$  (1.753 m)        Vitals:   02/11/22 1254 02/11/22 1630 02/11/22 1700 02/11/22 1730  BP:  98/87 109/71 107/76  Pulse:  (!) 58 63 60  Resp:   (!) 28 (!) 26  Temp:      TempSrc:      SpO2:  99% 97% 99%  Weight: 83.5 kg     Height: '5\' 9"'$  (1.753 m)     Constitutional: NAD, calm, comfortable Eyes: PERRL, lids and conjunctivae normal ENMT: Mucous membranes are moist. Posterior pharynx clear of any exudate or lesions.Normal dentition.  Neck: normal, supple, no masses, no thyromegaly Respiratory: clear to auscultation bilaterally, no wheezing, no crackles. Normal respiratory effort. No accessory muscle use.  Cardiovascular: Regular rate and rhythm, no murmurs / rubs / gallops. No extremity edema. 2+ pedal pulses. No carotid bruits.  Abdomen: no  tenderness, no masses palpated. No hepatosplenomegaly. Bowel sounds positive.  Musculoskeletal: no clubbing / cyanosis. No joint deformity upper and lower extremities. Good ROM, no contractures. Normal muscle tone.  Skin: no rashes, lesions, ulcers. No induration Neurologic: CN 2-12 grossly intact. Sensation intact, DTR normal. Strength 5/5 in all 4.  Psychiatric: Normal judgment and insight. Alert and oriented x 3. Normal mood.    Labs on Admission: I have personally reviewed following labs and imaging studies  CBC: Recent Labs  Lab 02/11/22 1255  WBC 9.9  HGB 15.2  HCT 45.8  MCV 95.8  PLT 53*   Basic Metabolic Panel: Recent Labs  Lab 02/11/22 1255  NA 142  K  4.2  CL 108  CO2 25  GLUCOSE 116*  BUN 47*  CREATININE 2.46*  CALCIUM 8.8*   GFR: Estimated Creatinine Clearance: 22.8 mL/min (A) (by C-G formula based on SCr of 2.46 mg/dL (H)). Liver Function Tests: Recent Labs  Lab 02/11/22 1255  AST 30  ALT 22  ALKPHOS 70  BILITOT 1.6*  PROT 6.2*  ALBUMIN 3.2*   No results for input(s): "LIPASE", "AMYLASE" in the last 168 hours. No results for input(s): "AMMONIA" in the last 168 hours. Coagulation Profile: No results for input(s): "INR", "PROTIME" in the last 168 hours. Cardiac Enzymes: No results for input(s): "CKTOTAL", "CKMB", "CKMBINDEX", "TROPONINI" in the last 168 hours. BNP (last 3 results) No results for input(s): "PROBNP" in the last 8760 hours. HbA1C: No results for input(s): "HGBA1C" in the last 72 hours. CBG: No results for input(s): "GLUCAP" in the last 168 hours. Lipid Profile: No results for input(s): "CHOL", "HDL", "LDLCALC", "TRIG", "CHOLHDL", "LDLDIRECT" in the last 72 hours. Thyroid Function Tests: No results for input(s): "TSH", "T4TOTAL", "FREET4", "T3FREE", "THYROIDAB" in the last 72 hours. Anemia Panel: No results for input(s): "VITAMINB12", "FOLATE", "FERRITIN", "TIBC", "IRON", "RETICCTPCT" in the last 72 hours. Urine analysis: No  results found for: "COLORURINE", "APPEARANCEUR", "LABSPEC", "PHURINE", "GLUCOSEU", "HGBUR", "BILIRUBINUR", "KETONESUR", "PROTEINUR", "UROBILINOGEN", "NITRITE", "LEUKOCYTESUR"  Radiological Exams on Admission: US Venous Img Lower Unilateral Left  Result Date: 02/11/2022 CLINICAL DATA:  Swelling and pain EXAM: Left LOWER EXTREMITY VENOUS DOPPLER ULTRASOUND TECHNIQUE: Gray-scale sonography with graded compression, as well as color Doppler and duplex ultrasound were performed to evaluate the lower extremity deep venous systems from the level of the common femoral vein and including the common femoral, femoral, profunda femoral, popliteal and calf veins including the posterior tibial, peroneal and gastrocnemius veins when visible. The superficial great saphenous vein was also interrogated. Spectral Doppler was utilized to evaluate flow at rest and with distal augmentation maneuvers in the common femoral, femoral and popliteal veins. COMPARISON:  None Available. FINDINGS: Contralateral Common Femoral Vein: Respiratory phasicity is normal and symmetric with the symptomatic side. No evidence of thrombus. Normal compressibility. Common Femoral Vein: No evidence of thrombus. Normal compressibility, respiratory phasicity and response to augmentation. Saphenofemoral Junction: No evidence of thrombus. Normal compressibility and flow on color Doppler imaging. Profunda Femoral Vein: No evidence of thrombus. Normal compressibility and flow on color Doppler imaging. Femoral Vein: No evidence of thrombus. Normal compressibility, respiratory phasicity and response to augmentation. Popliteal Vein: Positive for occlusive thrombus.  Noncompressible. Calf Veins: Positive for occlusive thrombus within the posterior tibial and peroneal veins. Noncompressible. IMPRESSION: Positive for acute occlusive DVT involving the calf veins and popliteal vein. Critical Value/emergent results were called by telephone at the time of interpretation on  02/11/2022 at 5:48 pm to provider [facility] , who verbally acknowledged these results. Electronically Signed   By: Donavan Foil M.D.   On: 02/11/2022 17:48   DG Chest 2 View  Result Date: 02/11/2022 CLINICAL DATA:  Shortness of breath x2 weeks EXAM: CHEST - 2 VIEW COMPARISON:  10/24/2019 FINDINGS: Cardiac size is within normal limits. There are no signs of pulmonary edema or focal pulmonary consolidation. There is no pleural effusion or pneumothorax. There is minimal blunting of right lateral CP angle. Pacemaker battery is seen in the left infraclavicular region. There is no pneumothorax. IMPRESSION: There are no signs of pulmonary edema or focal pulmonary consolidation. Blunting of right lateral CP angle may suggest minimal effusion or pleural thickening. Electronically Signed   By: Elmer Picker  M.D.   On: 02/11/2022 13:42    EKG: Independently reviewed.  Assessment/Plan  Acute DVT Left lower extremity  -in setting of low platelets  -case discussed with Dr Lucky Cowboy vascular, Dr Tish Men -plan heparin drip w/o bolus   Progressive SOB/ Probable PE  - await V/Q scan and further specialty recs  -cycle ce, -if patient does have PE-consider echo  -BNP pending   Thrombocytopenia  -monitor plt -continue heparin per heme/vascular  -await final recs   AKI on CKDIII -hold nephrotoxic medications -start gently ivfs  - hx of CHF with pef   CHFpef -11/2020 ef 50% -well compensated at this time  -continue amlodipine  Essential tremor -continue propanolol  -hold primidone due to AKI  Gout:  -hold colchicine due to AKI  on CKDIII  DVT prophylaxis: heparin Code Status: full Family Communication:  Disposition Plan: patient  expected to be admitted greater than 2 midnights  Consults called: Dr Lucky Cowboy vascular/ Dr Rogue Bussing heme Admission status: inpatient   Clance Boll MD [facility]   If 7PM-7AM, please contact night-coverage [email or web address] Password  [facility]  02/11/2022, 6:46 PM

## 2022-02-11 NOTE — ED Notes (Signed)
Informed RN bed assigned 

## 2022-02-12 ENCOUNTER — Inpatient Hospital Stay: Payer: HMO

## 2022-02-12 ENCOUNTER — Encounter: Payer: Self-pay | Admitting: Internal Medicine

## 2022-02-12 DIAGNOSIS — D693 Immune thrombocytopenic purpura: Secondary | ICD-10-CM

## 2022-02-12 DIAGNOSIS — J449 Chronic obstructive pulmonary disease, unspecified: Secondary | ICD-10-CM | POA: Insufficient documentation

## 2022-02-12 DIAGNOSIS — I700 Atherosclerosis of aorta: Secondary | ICD-10-CM | POA: Diagnosis not present

## 2022-02-12 DIAGNOSIS — N189 Chronic kidney disease, unspecified: Secondary | ICD-10-CM

## 2022-02-12 DIAGNOSIS — I5032 Chronic diastolic (congestive) heart failure: Secondary | ICD-10-CM

## 2022-02-12 DIAGNOSIS — I495 Sick sinus syndrome: Secondary | ICD-10-CM

## 2022-02-12 DIAGNOSIS — N179 Acute kidney failure, unspecified: Secondary | ICD-10-CM

## 2022-02-12 DIAGNOSIS — E440 Moderate protein-calorie malnutrition: Secondary | ICD-10-CM | POA: Insufficient documentation

## 2022-02-12 DIAGNOSIS — I824Z9 Acute embolism and thrombosis of unspecified deep veins of unspecified distal lower extremity: Secondary | ICD-10-CM | POA: Diagnosis not present

## 2022-02-12 DIAGNOSIS — M1A9XX0 Chronic gout, unspecified, without tophus (tophi): Secondary | ICD-10-CM

## 2022-02-12 DIAGNOSIS — R0602 Shortness of breath: Secondary | ICD-10-CM

## 2022-02-12 DIAGNOSIS — N1832 Chronic kidney disease, stage 3b: Secondary | ICD-10-CM

## 2022-02-12 DIAGNOSIS — G250 Essential tremor: Secondary | ICD-10-CM

## 2022-02-12 DIAGNOSIS — M109 Gout, unspecified: Secondary | ICD-10-CM

## 2022-02-12 DIAGNOSIS — D696 Thrombocytopenia, unspecified: Secondary | ICD-10-CM

## 2022-02-12 DIAGNOSIS — I10 Essential (primary) hypertension: Secondary | ICD-10-CM

## 2022-02-12 DIAGNOSIS — Z950 Presence of cardiac pacemaker: Secondary | ICD-10-CM

## 2022-02-12 LAB — CBC WITH DIFFERENTIAL/PLATELET
Abs Immature Granulocytes: 0.08 10*3/uL — ABNORMAL HIGH (ref 0.00–0.07)
Abs Immature Granulocytes: 0.07 10*3/uL (ref 0.00–0.07)
Basophils Absolute: 0.1 10*3/uL (ref 0.0–0.1)
Basophils Absolute: 0.1 10*3/uL (ref 0.0–0.1)
Basophils Relative: 1 %
Basophils Relative: 1 %
Eosinophils Absolute: 0.8 10*3/uL — ABNORMAL HIGH (ref 0.0–0.5)
Eosinophils Absolute: 0.3 10*3/uL (ref 0.0–0.5)
Eosinophils Relative: 10 %
Eosinophils Relative: 4 %
HCT: 42.8 % (ref 39.0–52.0)
HCT: 41.4 % (ref 39.0–52.0)
Hemoglobin: 14.6 g/dL (ref 13.0–17.0)
Hemoglobin: 13.9 g/dL (ref 13.0–17.0)
Immature Granulocytes: 1 %
Immature Granulocytes: 1 %
Lymphocytes Relative: 20 %
Lymphocytes Relative: 11 %
Lymphs Abs: 1.5 10*3/uL (ref 0.7–4.0)
Lymphs Abs: 0.8 10*3/uL (ref 0.7–4.0)
MCH: 31.9 pg (ref 26.0–34.0)
MCH: 31.6 pg (ref 26.0–34.0)
MCHC: 34.1 g/dL (ref 30.0–36.0)
MCHC: 33.6 g/dL (ref 30.0–36.0)
MCV: 93.4 fL (ref 80.0–100.0)
MCV: 94.1 fL (ref 80.0–100.0)
Monocytes Absolute: 0.7 10*3/uL (ref 0.1–1.0)
Monocytes Absolute: 0.2 10*3/uL (ref 0.1–1.0)
Monocytes Relative: 8 %
Monocytes Relative: 3 %
Neutro Abs: 4.6 10*3/uL (ref 1.7–7.7)
Neutro Abs: 5.3 10*3/uL (ref 1.7–7.7)
Neutrophils Relative %: 60 %
Neutrophils Relative %: 80 %
Platelets: 45 10*3/uL — ABNORMAL LOW (ref 150–400)
Platelets: 43 10*3/uL — ABNORMAL LOW (ref 150–400)
RBC: 4.58 MIL/uL (ref 4.22–5.81)
RBC: 4.4 MIL/uL (ref 4.22–5.81)
RDW: 12.9 % (ref 11.5–15.5)
RDW: 12.8 % (ref 11.5–15.5)
WBC: 7.7 10*3/uL (ref 4.0–10.5)
WBC: 6.6 10*3/uL (ref 4.0–10.5)
nRBC: 0 % (ref 0.0–0.2)
nRBC: 0 % (ref 0.0–0.2)

## 2022-02-12 LAB — CBC
HCT: 42.6 % (ref 39.0–52.0)
Hemoglobin: 14.1 g/dL (ref 13.0–17.0)
MCH: 31.5 pg (ref 26.0–34.0)
MCHC: 33.1 g/dL (ref 30.0–36.0)
MCV: 95.1 fL (ref 80.0–100.0)
Platelets: 35 10*3/uL — ABNORMAL LOW (ref 150–400)
RBC: 4.48 MIL/uL (ref 4.22–5.81)
RDW: 12.7 % (ref 11.5–15.5)
WBC: 7.6 10*3/uL (ref 4.0–10.5)
nRBC: 0 % (ref 0.0–0.2)

## 2022-02-12 LAB — TECHNOLOGIST SMEAR REVIEW: Plt Morphology: DECREASED

## 2022-02-12 LAB — PROTIME-INR
INR: 1.2 (ref 0.8–1.2)
Prothrombin Time: 14.9 seconds (ref 11.4–15.2)

## 2022-02-12 LAB — COMPREHENSIVE METABOLIC PANEL
ALT: 20 U/L (ref 0–44)
AST: 24 U/L (ref 15–41)
Albumin: 2.8 g/dL — ABNORMAL LOW (ref 3.5–5.0)
Alkaline Phosphatase: 61 U/L (ref 38–126)
Anion gap: 5 (ref 5–15)
BUN: 49 mg/dL — ABNORMAL HIGH (ref 8–23)
CO2: 24 mmol/L (ref 22–32)
Calcium: 8.2 mg/dL — ABNORMAL LOW (ref 8.9–10.3)
Chloride: 114 mmol/L — ABNORMAL HIGH (ref 98–111)
Creatinine, Ser: 2.4 mg/dL — ABNORMAL HIGH (ref 0.61–1.24)
GFR, Estimated: 26 mL/min — ABNORMAL LOW (ref >60)
Glucose, Bld: 100 mg/dL — ABNORMAL HIGH (ref 70–99)
Potassium: 4.1 mmol/L (ref 3.5–5.1)
Sodium: 143 mmol/L (ref 135–145)
Total Bilirubin: 1.3 mg/dL — ABNORMAL HIGH (ref 0.3–1.2)
Total Protein: 5.5 g/dL — ABNORMAL LOW (ref 6.5–8.1)

## 2022-02-12 LAB — URINALYSIS, COMPLETE (UACMP) WITH MICROSCOPIC
Bacteria, UA: NONE SEEN
Bilirubin Urine: NEGATIVE
Glucose, UA: NEGATIVE mg/dL
Hgb urine dipstick: NEGATIVE
Ketones, ur: 5 mg/dL — AB
Leukocytes,Ua: NEGATIVE
Nitrite: NEGATIVE
Protein, ur: NEGATIVE mg/dL
Specific Gravity, Urine: 1.02 (ref 1.005–1.030)
Squamous Epithelial / HPF: NONE SEEN (ref 0–5)
pH: 5 (ref 5.0–8.0)

## 2022-02-12 LAB — CREATININE, URINE, RANDOM: Creatinine, Urine: 176 mg/dL

## 2022-02-12 LAB — SODIUM, URINE, RANDOM: Sodium, Ur: 95 mmol/L

## 2022-02-12 LAB — HEMOGLOBIN A1C
Hgb A1c MFr Bld: 6 % — ABNORMAL HIGH (ref 4.8–5.6)
Mean Plasma Glucose: 125.5 mg/dL

## 2022-02-12 LAB — HEPARIN LEVEL (UNFRACTIONATED)
Heparin Unfractionated: 0.58 IU/mL (ref 0.30–0.70)
Heparin Unfractionated: 0.74 IU/mL — ABNORMAL HIGH (ref 0.30–0.70)
Heparin Unfractionated: 0.62 IU/mL (ref 0.30–0.70)

## 2022-02-12 LAB — LACTATE DEHYDROGENASE: LDH: 565 U/L — ABNORMAL HIGH (ref 98–192)

## 2022-02-12 LAB — FIBRINOGEN: Fibrinogen: 390 mg/dL (ref 210–475)

## 2022-02-12 MED ORDER — ADULT MULTIVITAMIN W/MINERALS CH
1.0000 | ORAL_TABLET | Freq: Every day | ORAL | Status: DC
  Administered 2022-02-12 – 2022-02-18 (×7): 1 via ORAL
  Filled 2022-02-12 (×7): qty 1

## 2022-02-12 MED ORDER — TECHNETIUM TO 99M ALBUMIN AGGREGATED
4.4000 | Freq: Once | INTRAVENOUS | Status: AC | PRN
  Administered 2022-02-12: 4.4 via INTRAVENOUS

## 2022-02-12 MED ORDER — PANTOPRAZOLE SODIUM 40 MG PO TBEC
40.0000 mg | DELAYED_RELEASE_TABLET | Freq: Every day | ORAL | Status: DC
  Administered 2022-02-12 – 2022-02-18 (×7): 40 mg via ORAL
  Filled 2022-02-12 (×7): qty 1

## 2022-02-12 MED ORDER — PROPRANOLOL HCL ER 60 MG PO CP24
60.0000 mg | ORAL_CAPSULE | Freq: Every day | ORAL | Status: DC
  Filled 2022-02-12: qty 1

## 2022-02-12 MED ORDER — PROPRANOLOL HCL ER 60 MG PO CP24
180.0000 mg | ORAL_CAPSULE | Freq: Every day | ORAL | Status: DC
  Administered 2022-02-12 – 2022-02-18 (×7): 180 mg via ORAL
  Filled 2022-02-12 (×7): qty 3

## 2022-02-12 MED ORDER — PROPRANOLOL HCL ER 120 MG PO CP24
120.0000 mg | ORAL_CAPSULE | Freq: Every day | ORAL | Status: DC
  Filled 2022-02-12: qty 1

## 2022-02-12 MED ORDER — ENSURE ENLIVE PO LIQD
237.0000 mL | Freq: Three times a day (TID) | ORAL | Status: DC
  Administered 2022-02-12 – 2022-02-18 (×18): 237 mL via ORAL

## 2022-02-12 MED ORDER — ISOSORBIDE MONONITRATE ER 30 MG PO TB24
30.0000 mg | ORAL_TABLET | Freq: Every day | ORAL | Status: DC
  Administered 2022-02-12 – 2022-02-18 (×7): 30 mg via ORAL
  Filled 2022-02-12 (×7): qty 1

## 2022-02-12 MED ORDER — PROPRANOLOL HCL ER 60 MG PO CP24
180.0000 mg | ORAL_CAPSULE | Freq: Every day | ORAL | Status: DC
  Filled 2022-02-12: qty 1

## 2022-02-12 MED ORDER — ROSUVASTATIN CALCIUM 10 MG PO TABS
10.0000 mg | ORAL_TABLET | Freq: Every day | ORAL | Status: DC
  Administered 2022-02-12 – 2022-02-17 (×6): 10 mg via ORAL
  Filled 2022-02-12 (×6): qty 1

## 2022-02-12 MED ORDER — DEXAMETHASONE 6 MG PO TABS
20.0000 mg | ORAL_TABLET | Freq: Every day | ORAL | Status: DC
  Administered 2022-02-12 – 2022-02-14 (×3): 20 mg via ORAL
  Filled 2022-02-12 (×3): qty 2

## 2022-02-12 NOTE — Assessment & Plan Note (Addendum)
   HemOnc following - workup pending but suspect ITP -->   dexamethasone 20 mg p.o. daily x 4 days or so  hold platelet transfusion unless platelets keep trending down to 30 or if bleeding  Consider IVIG infusions if needed  Continue heparin for now  Close monitor CBC

## 2022-02-12 NOTE — Assessment & Plan Note (Signed)
   Holding colchicine d/t AKI

## 2022-02-12 NOTE — Assessment & Plan Note (Addendum)
Etiology HTN / OSA  HFoEF 50-55% Slight elevation BNP in ED but appears fluid compensated, CXR neg  home meds based on last cardiology note 11/2021: isosorbide ER 30 mg, propranolol ER 240 mg qhs, pravastatin 20 mg - pt reports not taking statin   Cont isosorbide  added rosuvastatin  lowered dose propranolol d/t borderline BP  Pt does not appear to be in heart failure exacerbation at this time based on exam, labs, CXR. If this changes, would get urgent echo to assess R heart strain, awaiting VQ now

## 2022-02-12 NOTE — Progress Notes (Addendum)
Initial Nutrition Assessment  DOCUMENTATION CODES:   Non-severe (moderate) malnutrition in context of chronic illness  INTERVENTION:   -Liberalize diet to 2 gram sodium for wider variety of meal selections -MVI with minerals daily -Ensure Enlive po TID, each supplement provides 350 kcal and 20 grams of protein -Feeding assistance with meals  NUTRITION DIAGNOSIS:   Moderate Malnutrition related to chronic illness (COPD, CHF) as evidenced by energy intake < or equal to 75% for > or equal to 1 month, mild fat depletion, moderate fat depletion, mild muscle depletion, moderate muscle depletion, percent weight loss.  GOAL:   Patient will meet greater than or equal to 90% of their needs  MONITOR:   PO intake, Supplement acceptance  REASON FOR ASSESSMENT:   Malnutrition Screening Tool    ASSESSMENT:   Pt with medical history significant of   COPD, HTN, SSS s/p pacemaker,  OSA intolerant to CPAP ,CHFpef ( 50-55%), CKDIII,Gout, who presents from  PCP office with complaints of 2 weeks of persistent sob with associated lower extremity swelling.  Pt admitted with acute DVT of lt lower extremity.   Reviewed I/O's: +255 ml x 24 hours  UOP: 380 ml x 24 hours  Spoke with pt at bedside, who was pleasant and in good spirits today. He reports he has experienced a general decline in health over the past month secondary to sides effects from gabapentin (wiggly legs, crying all of the time) which he was prescribed for back pain. He reports his MD took him off gabapentin about a week ago secondary to the side effects.   Pt shares that he snacks gradually throughout the day on foods such as peanut butter crackers. Pt explains that he has tremors, which makes it difficult for him to feed himself and prepare meals ("If I ate a standard meal, I would just throw food all over the place"). Noted meal completions 25%. Breakfast tray arrived during RD visit; RD assisted pt with tray set up and feeding.     Per pt, his UBW is around 165#, which is what he weighed consistently up until a few months ago. He estimates that he has lost about 20 pounds over the past month. Reviewed wt hx; pt has experienced a 10.6% wt loss over the past 6 months, which is significant for time frame. Pt also with moderate edema, which may be masking further weight loss as well as fat and muscle depletions.   RD discussed importance of good meal and supplement intake to promote healing. Pt was considering purchasing Ensure supplements and is amenable to them here.   Medications reviewed.   Labs reviewed.   NUTRITION - FOCUSED PHYSICAL EXAM:  Flowsheet Row Most Recent Value  Orbital Region Moderate depletion  Upper Arm Region Moderate depletion  Thoracic and Lumbar Region Mild depletion  Buccal Region Mild depletion  Temple Region Moderate depletion  Clavicle Bone Region Moderate depletion  Clavicle and Acromion Bone Region Moderate depletion  Scapular Bone Region Moderate depletion  Dorsal Hand Moderate depletion  Patellar Region Mild depletion  Anterior Thigh Region Mild depletion  Posterior Calf Region Mild depletion  Edema (RD Assessment) Moderate  Hair Reviewed  Eyes Reviewed  Mouth Reviewed  Skin Reviewed  Nails Reviewed       Diet Order:   Diet Order             Diet heart healthy/carb modified Room service appropriate? Yes; Fluid consistency: Thin  Diet effective now  EDUCATION NEEDS:   Education needs have been addressed  Skin:  Skin Assessment: Reviewed RN Assessment  Last BM:  Unknown  Height:   Ht Readings from Last 1 Encounters:  02/11/22 '5\' 9"'$  (1.753 m)    Weight:   Wt Readings from Last 1 Encounters:  02/11/22 75.1 kg    Ideal Body Weight:  72.7 kg  BMI:  Body mass index is 24.45 kg/m.  Estimated Nutritional Needs:   Kcal:  2000-2200  Protein:  100-115 grams  Fluid:  > 2 L    Loistine Chance, RD, LDN, [place] Registered Dietitian  II Certified Diabetes Care and Education Specialist Please refer to [facility] for RD and/or RD on-call/weekend/after hours pager

## 2022-02-12 NOTE — Consult Note (Addendum)
ANTICOAGULATION CONSULT NOTE  Pharmacy Consult for IV Heparin Indication: DVT   Patient Measurements: Height: '5\' 9"'$  (175.3 cm) Weight: 75.1 kg (165 lb 9.1 oz) IBW/kg (Calculated) : 70.7 Heparin Dosing Weight: 83.5 kg  Labs: Recent Labs    02/11/22 1255 02/11/22 1947 02/12/22 0450 02/12/22 1111  HGB 15.2 15.1 14.1 14.6  HCT 45.8  --  42.6 42.8  PLT 53*  --  35* 45*  APTT  --  31  --   --   LABPROT  --  15.1 14.9  --   INR  --  1.2 1.2  --   HEPARINUNFRC  --   --  0.58  --   CREATININE 2.46*  --  2.40*  --   TROPONINIHS 14  --   --   --      Estimated Creatinine Clearance: 23.3 mL/min (A) (by C-G formula based on SCr of 2.4 mg/dL (H)).   Medical History: Past Medical History:  Diagnosis Date   Arthritis    Cancer ([place])    COPD (chronic obstructive pulmonary disease) (HCC)    Hoarseness of voice    Hypertension    MRSA (methicillin resistant staph aureus) culture positive    Sick sinus syndrome (HCC)    Sleep apnea    Tremor     Medications:  Aspirin 81 PTA  Assessment: Patient is a 86 y/o M with medical history as above who presented to the ED 8/23 from PCP office with SOB x 2 weeks. Reported recent diastolic CHF diagnosis and diuretic initiation. Endorsing left foot swelling. Patient subsequently found to have LLE DVT. Pt was found to have thrombocytopenia (PLT 53k) on arrival to ED, per note from hematology low suspicion for HIT. Per hematology recs, ok to continue heparin drip. Started on dexamethasone 20 mg daily for thrombocytopenia. Pt was started on heparin drip without bolus due to thrombocytopenia. Patient was not on anticoagulation prior to admission but does take aspirin 81 mg daily. V/Q scan was normal, no evidence of PE. Pharmacy consulted to initiate and manage heparin infusion for VTE treatment.  Hgb and HCT stable, PLT 54>>35>>43  Goal of Therapy:  Heparin level 0.3 - 0.5 units/ml (no bolus dosing given thrombocytopenia) Monitor platelets by  anticoagulation protocol: Yes  8/24 0450 HL 0.58 8/24 1414 HL 0.74, supratherapeutic  Per protocol, decrease by 2-3 units/kg/hr due to lower goal of 0.3-0.5   Plan:  --Decrease heparin infusion to 1000 units/hr --Recheck HL in 8 hr  --Daily CBC while on heparin --Monitor PLT due to thrombocytopenia  Rodolph Bong, PharmD Candidate 02/12/2022 1:19 PM

## 2022-02-12 NOTE — Progress Notes (Signed)
Patient's gown changed. Peri care and bed bath. Urinal emptied. Pt is alert and oriented. Heparin 10 ml/hr infusing in Right wrist 20 G PIV. 20 G Left PIV in wrist is flushed and saline locked.

## 2022-02-12 NOTE — Consult Note (Signed)
ANTICOAGULATION CONSULT NOTE  Pharmacy Consult for IV Heparin Indication: DVT   Patient Measurements: Height: '5\' 9"'$  (175.3 cm) Weight: 75.1 kg (165 lb 9.1 oz) IBW/kg (Calculated) : 70.7 Heparin Dosing Weight: 83.5 kg  Labs: Recent Labs    02/11/22 1255 02/11/22 1947 02/12/22 0450 02/12/22 1111 02/12/22 1414 02/12/22 2255  HGB 15.2 15.1 14.1 14.6 13.9  --   HCT 45.8  --  42.6 42.8 41.4  --   PLT 53*  --  35* 45* 43*  --   APTT  --  31  --   --   --   --   LABPROT  --  15.1 14.9  --   --   --   INR  --  1.2 1.2  --   --   --   HEPARINUNFRC  --   --  0.58  --  0.74* 0.62  CREATININE 2.46*  --  2.40*  --   --   --   TROPONINIHS 14  --   --   --   --   --      Estimated Creatinine Clearance: 23.3 mL/min (A) (by C-G formula based on SCr of 2.4 mg/dL (H)).   Medical History: Past Medical History:  Diagnosis Date   Arthritis    Cancer ([place])    COPD (chronic obstructive pulmonary disease) (HCC)    Hoarseness of voice    Hypertension    MRSA (methicillin resistant staph aureus) culture positive    Sick sinus syndrome (HCC)    Sleep apnea    Tremor     Medications:  Aspirin 81 PTA  Assessment: Patient is a 86 y/o M with medical history as above who presented to the ED 8/23 from PCP office with SOB x 2 weeks. Reported recent diastolic CHF diagnosis and diuretic initiation. Endorsing left foot swelling. Patient subsequently found to have LLE DVT. Pt was found to have thrombocytopenia (PLT 53k) on arrival to ED, per note from hematology low suspicion for HIT. Per hematology recs, ok to continue heparin drip. Started on dexamethasone 20 mg daily for thrombocytopenia. Pt was started on heparin drip without bolus due to thrombocytopenia. Patient was not on anticoagulation prior to admission but does take aspirin 81 mg daily. V/Q scan was normal, no evidence of PE. Pharmacy consulted to initiate and manage heparin infusion for VTE treatment.  Hgb and HCT stable, PLT  54>>35>>43  Goal of Therapy:  Heparin level 0.3 - 0.5 units/ml (no bolus dosing given thrombocytopenia) Monitor platelets by anticoagulation protocol: Yes  8/24 0450 HL 0.58 8/24 1414 HL 0.74, supratherapeutic 8/24 2255 HL 0.62, therapeutic x 1  Per protocol, decrease by 2-3 units/kg/hr due to lower goal of 0.3-0.5   Plan:  --Continue heparin infusion at 1000 units/hr --Recheck HL w/ AM labs to confirm --Daily CBC while on heparin --Monitor PLT due to thrombocytopenia

## 2022-02-12 NOTE — Hospital Course (Addendum)
Darren Gonzales is a 86 y.o. male with medical history significant of COPD, HTN, SSS s/p pacemaker,  OSA intolerant to CPAP, HFpEF (EF 50-55%), CKDIIIb, Gout, who presents to ED 02/11/22 from PCP office with complaints of 2 weeks of persistent sob with associated lower extremity swelling. He has also noted associated orthopnea, fatigue, as well chest pain unable to describe but notes it is intermittent. Assoc w/ chills, as well as coughing nonproductive.   He also was concerned about side-effect of gabapentin and was told to hold this medication by PCP as well as hold his amlodipine. He was also recently started on lasix 20 mg 3 days PTA. 08/23: (+)DVT LLE, low Plt, AKI w/ Cr 2.46 on CKD3b baseline Cr 1.9. BNP 414. A1C 6.0. Admitted to hospitalist service for DVT. Started heparin gtt w/o bolus, await VQ d/t AKI.  08/24: Cr 2.40. FeNa c/w pre-renal cause, UA no UTI, (+)ketones c/w dehydration. Hematology recs: see lab orders. Pt does not appear to be in heart failure exacerbation at this time based on exam, labs, CXR. VQ study normal. Continuing heparin drip.  08/25: Cr 1.96. HemOnc dx ITP, starting IVIG today. BNP trending down and no CHF exacerbation based on exam, still needing O2, will trial wean off this. C/o blurred vision per RN yesterday evening, CT head negative, cannot perform MRI d/t pacemaker 08/26: reports blurred vision resolved, he gets these episodes of feeling grainy sensation in eye and then redness. Cancelled MRI. Still low Plt, continuing IVIG and heparin per Hematology, also increasing prednisone. Plt 58. On room air now.  08/27: Plt back to 40. Cr improving 1.62 - about baseline 08/28: Plt 44. Cr 1.76. BP higher today, changed antihypertensive regimen  08/29: still on IVIG   Consultants: Vascular surgery (Dr Lucky Cowboy) for DVT possible PE HemOnc (Dr Rogue Bussing) for thrombocytopenia, heparin management DVT  Procedures:  None

## 2022-02-12 NOTE — Assessment & Plan Note (Signed)
   Heparin drip w/o bolus d/t low Plt   Pending VQ to evaluate for PE  Vascular surgery consulted

## 2022-02-12 NOTE — Assessment & Plan Note (Signed)
D/t SSS  Telemetry for now

## 2022-02-12 NOTE — Assessment & Plan Note (Signed)
   home meds based on last cardiology note 11/2021: isosorbide ER 30 mg, propranolol ER 240 mg qhs, pravastatin 20 mg

## 2022-02-12 NOTE — Assessment & Plan Note (Signed)
Tachypnea w/o tachcyardia but he has SSS / pacemaker so may not have tachycardia  No drop in SpO2 but pt is comfortable on 2L/min  CXR no concerns for infection or fluid overload  Continue O2

## 2022-02-12 NOTE — Assessment & Plan Note (Signed)
On propranolol and primidone at home  Primidone held d/t AKI  Cont propranolol but caution w/ low BP

## 2022-02-12 NOTE — Consult Note (Signed)
[facility] CONSULT NOTE  Patient Care Team: Rusty Aus, MD as PCP - General (Internal Medicine)  CHIEF COMPLAINTS/PURPOSE OF CONSULTATION: Thrombocytopenia context of acute DVT  HISTORY OF PRESENTING ILLNESS:  Darren Gonzales 86 y.o.  male with multiple medical problems including CAD/CHF chronic kidney disease stage IV is currently admitted to hospital for LEFT  lower extremity DVT.  [place]- AUG 2023-IMPRESSION: "Positive for acute occlusive DVT involving the calf veins and popliteal vein"  Patient is currently started on IV heparin without bolus.    incidentally noted to have platelets of 35.  Hemoglobin 14 white count normal.  Chronic kidney disease stage IV.  Patient also has shortness of breath orthopnea for last 2 weeks or so.  He is awaiting a VQ scan.  Prior known history of thrombocytopenia: Chronic-seems to 2020-however more recently August 2023 platelets 30s.   Easy bruising/bleeding: Intermittent.  Also complains of intermittent blood in stool chronic which he attributes to hemorrhoidal.  No pain while on heparin.  Medications: No new medications.  Alcohol: Denies alcohol.  Antiplatelet therapy/blood thinners: Baby aspirin.  Hepatitis/HIV: Denies any history of hepatitis or HIV.  Currently admits to improvement of his leg pain. Denies any bleeding at this time.   Review of Systems  Constitutional:  Positive for malaise/fatigue. Negative for chills, diaphoresis, fever and weight loss.  HENT:  Negative for nosebleeds and sore throat.   Eyes:  Negative for double vision.  Respiratory:  Positive for shortness of breath. Negative for cough, hemoptysis, sputum production and wheezing.   Cardiovascular:  Positive for orthopnea and leg swelling. Negative for chest pain and palpitations.  Gastrointestinal:  Negative for abdominal pain, blood in stool, constipation, diarrhea, heartburn, melena, nausea and vomiting.  Genitourinary:  Negative for dysuria,  frequency and urgency.  Musculoskeletal:  Positive for back pain and joint pain.  Skin: Negative.  Negative for itching and rash.  Neurological:  Positive for weakness. Negative for dizziness, tingling, focal weakness and headaches.  Endo/Heme/Allergies:  Does not bruise/bleed easily.  Psychiatric/Behavioral:  Negative for depression. The patient is not nervous/anxious and does not have insomnia.      MEDICAL HISTORY:  Past Medical History:  Diagnosis Date   Arthritis    Cancer ([place])    COPD (chronic obstructive pulmonary disease) ([place])    Hoarseness of voice    Hypertension    MRSA (methicillin resistant staph aureus) culture positive    Sick sinus syndrome ([place])    Sleep apnea    Tremor     SURGICAL HISTORY: Past Surgical History:  Procedure Laterality Date   CATARACT EXTRACTION     COLONOSCOPY WITH PROPOFOL N/A 01/15/2016   Procedure: COLONOSCOPY WITH PROPOFOL;  Surgeon: Manya Silvas, MD;  Location: [facility] ENDOSCOPY;  Service: Endoscopy;  Laterality: N/A;   ESOPHAGOGASTRODUODENOSCOPY (EGD) WITH PROPOFOL N/A 01/15/2016   Procedure: ESOPHAGOGASTRODUODENOSCOPY (EGD) WITH PROPOFOL;  Surgeon: Manya Silvas, MD;  Location: [facility] ENDOSCOPY;  Service: Endoscopy;  Laterality: N/A;   INSERT / REPLACE / REMOVE PACEMAKER     POSTERIOR LAMINECTOMY / DECOMPRESSION LUMBAR SPINE     PPM GENERATOR CHANGEOUT N/A 08/07/2021   Procedure: PPM GENERATOR CHANGEOUT;  Surgeon: Isaias Cowman, MD;  Location: [facility] CV LAB;  Service: Cardiovascular;  Laterality: N/A;    SOCIAL HISTORY: Social History   Socioeconomic History   Marital status: Married    Spouse name: Not on file   Number of children: Not on file   Years of education: Not on file  Highest education level: Not on file  Occupational History   Not on file  Tobacco Use   Smoking status: Former   Smokeless tobacco: Never  Substance and Sexual Activity   Alcohol use: No   Drug use: No   Sexual activity: Not on file   Other Topics Concern   Not on file  Social History Narrative   Not on file   Social Determinants of Health   Financial Resource Strain: Not on file  Food Insecurity: Not on file  Transportation Needs: Not on file  Physical Activity: Not on file  Stress: Not on file  Social Connections: Not on file  Intimate Partner Violence: Not on file    FAMILY HISTORY: History reviewed. No pertinent family history.  ALLERGIES:  is allergic to lipitor [atorvastatin].  MEDICATIONS:  Current Facility-Administered Medications  Medication Dose Route Frequency Provider Last Rate Last Admin   acetaminophen (TYLENOL) tablet 650 mg  650 mg Oral Q6H PRN Clance Boll, MD       Or   acetaminophen (TYLENOL) suppository 650 mg  650 mg Rectal Q6H PRN Clance Boll, MD       albuterol (PROVENTIL) (2.5 MG/3ML) 0.083% nebulizer solution 2.5 mg  2.5 mg Nebulization Q2H PRN Clance Boll, MD       dexamethasone (DECADRON) tablet 20 mg  20 mg Oral Daily Charlaine Dalton R, MD   20 mg at 02/12/22 1001   feeding supplement (ENSURE ENLIVE / ENSURE PLUS) liquid 237 mL  237 mL Oral TID BM Emeterio Reeve, DO   237 mL at 02/12/22 1055   heparin ADULT infusion 100 units/mL (25000 units/211m)  1,200 Units/hr Intravenous Continuous CBenita Gutter RPH 12 mL/hr at 02/12/22 0320 1,200 Units/hr at 02/12/22 0320   isosorbide mononitrate (IMDUR) 24 hr tablet 30 mg  30 mg Oral Daily TMyles RosenthalA, MD   30 mg at 02/12/22 00960  multivitamin with minerals tablet 1 tablet  1 tablet Oral Daily AEmeterio Reeve DO   1 tablet at 02/12/22 1055   ondansetron (ZOFRAN) tablet 4 mg  4 mg Oral Q6H PRN TClance Boll MD       Or   ondansetron ([facility] injection 4 mg  4 mg Intravenous Q6H PRN TClance Boll MD       pantoprazole (PROTONIX) EC tablet 40 mg  40 mg Oral Daily TMyles RosenthalA, MD   40 mg at 02/12/22 0954   propranolol ER (INDERAL LA) 24 hr capsule 180 mg  180 mg Oral Daily  AEmeterio Reeve DO   180 mg at 02/12/22 0959   rosuvastatin (CRESTOR) tablet 10 mg  10 mg Oral QHS TClance Boll MD        PHYSICAL EXAMINATION:  Vitals:   02/12/22 0959 02/12/22 1151  BP:  117/72  Pulse: 62 60  Resp:  (!) 25  Temp:  97.8 F (36.6 C)  SpO2:     Filed Weights   02/11/22 1254 02/11/22 2148  Weight: 184 lb 1.4 oz (83.5 kg) 165 lb 9.1 oz (75.1 kg)   Patient has bilateral upper extremity significant tremors-chronic.  Physical Exam Vitals and nursing note reviewed.  HENT:     Head: Normocephalic and atraumatic.     Mouth/Throat:     Pharynx: Oropharynx is clear.  Eyes:     Extraocular Movements: Extraocular movements intact.     Pupils: Pupils are equal, round, and reactive to light.  Cardiovascular:     Rate and  Rhythm: Normal rate and regular rhythm.  Pulmonary:     Comments: Decreased breath sounds bilaterally.  Abdominal:     Palpations: Abdomen is soft.  Musculoskeletal:        General: Normal range of motion.     Cervical back: Normal range of motion.  Skin:    General: Skin is warm.  Neurological:     General: No focal deficit present.     Mental Status: He is alert and oriented to person, place, and time.  Psychiatric:        Behavior: Behavior normal.        Judgment: Judgment normal.      LABORATORY DATA:  I have reviewed the data as listed Lab Results  Component Value Date   WBC 7.7 02/12/2022   HGB 14.6 02/12/2022   HCT 42.8 02/12/2022   MCV 93.4 02/12/2022   PLT 45 (L) 02/12/2022   Recent Labs    02/11/22 1255 02/12/22 0450  NA 142 143  K 4.2 4.1  CL 108 114*  CO2 25 24  GLUCOSE 116* 100*  BUN 47* 49*  CREATININE 2.46* 2.40*  CALCIUM 8.8* 8.2*  GFRNONAA 25* 26*  PROT 6.2* 5.5*  ALBUMIN 3.2* 2.8*  AST 30 24  ALT 22 20  ALKPHOS 70 61  BILITOT 1.6* 1.3*    RADIOGRAPHIC STUDIES: I have personally reviewed the radiological images as listed and agreed with the findings in the report.  Assessment and  plan:  # 86 year old male patient with multiple medical problems including CAD/CHF chronic kidney disease stage IV is currently admitted to hospital for right lower extremity DVT; incidentally noted to have platelets of 35.    # Severe thrombocytopenia-platelets 35-40s-etiology is unclear however clinically suspicious of ITP.  As patient has had intermittent thrombocytopenia in the past.  Other differential includes consumption from DVT.  Clinically less likely DIC or any malignant process.  Do not suspect any microangiopathic hemolytic process-as hemoglobin is stable; renal insufficiency is chronic.  Low clinical suspicion for HIT.   #Acute DVT of the LEFT lower extremity-continue IV heparin without bolus-given acute thrombocytopenia.  #Orthopnea/short of breath on exertion- ?  Acute PE; vs-cardiac etiology.  Awaiting VQ scan given the chronic insufficiency.   #CKD-stage IV stable   Recommendations/plan:  #Recommend checking fibrinogen; review of peripheral smear; LDH;    #Given the clinical suspicion for ITP I would recommend dexamethasone 20 mg p.o. daily x 4 days or so based on clinical improvement.  Hold platelet transfusion unless platelets keep trending down to 30 or so or if patient starts bleeding.  Consider IVIG infusions if patient's platelets do not improve or worsen.   Thank you Dr. Sheppard Coil for allowing me to participate in the care of your pleasant patient. Please do not hesitate to contact me with questions or concerns in the interim.  Discussed with Dr. Sheppard Coil.  I also spoke to the patient's son over the phone updated on the treatment plan.  All questions were answered. The patient knows to call the clinic with any problems, questions or concerns.     [name], MD 02/12/2022 12:30 PM

## 2022-02-12 NOTE — Progress Notes (Signed)
PROGRESS NOTE    Darren Gonzales   KNL:[id number] DOB: 1935-06-27  DOA: 02/11/2022 Date of Service: 02/12/22 PCP: Rusty Aus, MD     Brief Narrative / Hospital Course:  Darren Gonzales is a 86 y.o. male with medical history significant of COPD, HTN, SSS s/p pacemaker,  OSA intolerant to CPAP, HFpEF (EF 50-55%), CKDIIIb, Gout, who presents to ED 02/11/22 from PCP office with complaints of 2 weeks of persistent sob with associated lower extremity swelling. He has also noted associated orthopnea, fatigue, as well chest pain unable to describe but notes it is intermittent. Assoc w/ chills, as well as coughing nonproductive.   He also was concerned about side-effect of gabapentin and was told to hold this medication by PCP as well as hold his amlodipine. He was also recently started on lasix 20 mg 3 days PTA. 08/23: (+)DVT LLE, low Plt, AKI w/ Cr 2.46 on CKD3b baseline Cr 1.9. BNP 414. A1C 6.0. Admitted to hospitalist service for DVT. Started heparin gtt w/o bolus, await VQ d/t AKI.  08/24: Cr 2.40. Ordered UA.micro, urine lytes to calculate FeNa, consider renal US -->  FeNa c/w pre-renal cause, UA no UTI, (+)ketones c/w dehydration. VQ scheduled for this afternoon. Vascular recs pending VQ. Hematology recs: see lab orders. Pt does not appear to be in heart failure exacerbation at this time based on exam, labs, CXR.    Consultants: Vascular surgery (Dr Lucky Cowboy) for DVT possible PE HemOnc (Dr Rogue Bussing) for thrombocytopenia   Procedures:  None    Subjective: Patient reports feeling short winded but improved on O2, no CP, no dizziness. Son is at bedside,      ASSESSMENT & PLAN:   Principal Problem:   DVT (deep venous thrombosis) (HCC) Active Problems:   Aortic atherosclerosis (HCC)   Benign essential tremor   Chronic diastolic CHF (congestive heart failure), NYHA class 3 (HCC)   Stage 3b chronic kidney disease (CKD) (HCC)   Essential hypertension   S/P placement of cardiac  pacemaker   Sinoatrial node dysfunction (HCC)   Acute kidney injury superimposed on chronic kidney disease stage 3b (HCC)   Gout   Thrombocytopenia (HCC)   Malnutrition of moderate degree   Shortness of breath   DVT (deep venous thrombosis) (HCC) Heparin drip w/o bolus d/t low Plt  Pending VQ to evaluate for PE Vascular surgery consulted   Acute kidney injury superimposed on chronic kidney disease stage 3b ([place]) Prerenal based on FeNa, likely dehydration UA no UTI/casts Holding nephrotoxic meds Awaiting VQ scan for PE Caution w/ fluid resuscitation d/t hx HFpEF and slightly elevated BNP here though this more likely due to poor renal clearance/age. Pt does not appear to be in heart failure exacerbation at this time based on exam, CXR.  consider renal US / nephro consult if no improvement   S/P placement of cardiac pacemaker D/t SSS Telemetry for now   Chronic diastolic CHF (congestive heart failure), NYHA class 3 (HCC) Etiology HTN / OSA  HFoEF 50-55% Slight elevation BNP in ED but appears fluid compensated, CXR neg home meds based on last cardiology note 11/2021: isosorbide ER 30 mg, propranolol ER 240 mg qhs, pravastatin 20 mg - pt reports not taking statin  Cont isosorbide added rosuvastatin lowered dose propranolol d/t borderline BP Pt does not appear to be in heart failure exacerbation at this time based on exam, labs, CXR. If this changes, would get urgent echo to assess R heart strain, awaiting VQ now  Essential hypertension home meds based on last cardiology note 11/2021: isosorbide ER 30 mg, propranolol ER 240 mg qhs, pravastatin 20 mg  Benign essential tremor On propranolol and primidone at home Primidone held d/t AKI Cont propranolol but caution w/ low BP  Gout Holding colchicine d/t AKI  Thrombocytopenia (HCC) HemOnc following - workup pending but suspect ITP -->  dexamethasone 20 mg p.o. daily x 4 days or so hold platelet transfusion unless platelets  keep trending down to 30 or if bleeding Consider IVIG infusions if needed Continue heparin for now Close monitor CBC   Shortness of breath Tachypnea w/o tachcyardia but he has SSS / pacemaker so may not have tachycardia  No drop in SpO2 but pt is comfortable on 2L/min [place] CXR no concerns for infection or fluid overload Continue O2    DVT prophylaxis: heparin Code Status: FULL Family Communication: son at bedside Disposition Plan / TOC needs: home when stabilized Barriers to discharge / significant pending items: VQ pending, vascular recs, heparin drip, expect discharge in approx 2 days             Objective: Vitals:   02/12/22 0700 02/12/22 0809 02/12/22 0959 02/12/22 1151  BP:  126/83  117/72  Pulse: 60 (!) 55 62 60  Resp: (!) 26 16  (!) 25  Temp:  97.8 F (36.6 C)  97.8 F (36.6 C)  TempSrc:  Axillary  Oral  SpO2: 98% 98%    Weight:      Height:        Intake/Output Summary (Last 24 hours) at 02/12/2022 1234 Last data filed at 02/12/2022 1000 Gross per 24 hour  Intake 874.86 ml  Output 380 ml  Net 494.86 ml   Filed Weights   02/11/22 1254 02/11/22 2148  Weight: 83.5 kg 75.1 kg    Examination:  Constitutional:  VS as above General Appearance: alert, well-developed, well-nourished, NAD Eyes: Normal lids and conjunctive, non-icteric sclera Ears, Nose, Mouth, Throat: Normal external appearance MMM Neck: No masses, trachea midline, no JVD or carotid bruit Respiratory: Normal respiratory effort No wheeze No rhonchi No rales Cardiovascular: S1/S2 normal, RRR No murmur Question gallop vs psychologic split  No JVD No lower extremity edema Gastrointestinal: No tenderness No masses Musculoskeletal:  No clubbing/cyanosis of digits Symmetrical movement in all extremities Neurological: No cranial nerve deficit on limited exam Alert Psychiatric: Normal judgment/insight Normal mood and affect       Scheduled Medications:   dexamethasone   20 mg Oral Daily   feeding supplement  237 mL Oral TID BM   isosorbide mononitrate  30 mg Oral Daily   multivitamin with minerals  1 tablet Oral Daily   pantoprazole  40 mg Oral Daily   propranolol ER  180 mg Oral Daily   rosuvastatin  10 mg Oral QHS    Continuous Infusions:  heparin 1,200 Units/hr (02/12/22 0320)    PRN Medications:  acetaminophen **OR** acetaminophen, albuterol, ondansetron **OR** ondansetron (ZOFRAN) IV  Antimicrobials:  Anti-infectives (From admission, onward)    None       Data Reviewed: I have personally reviewed following labs and imaging studies  CBC: Recent Labs  Lab 02/11/22 1255 02/11/22 1947 02/12/22 0450 02/12/22 1111  WBC 9.9  --  7.6 7.7  NEUTROABS  --   --   --  4.6  HGB 15.2 15.1 14.1 14.6  HCT 45.8  --  42.6 42.8  MCV 95.8  --  95.1 93.4  PLT 53*  --  35* 45*  Basic Metabolic Panel: Recent Labs  Lab 02/11/22 1255 02/12/22 0450  NA 142 143  K 4.2 4.1  CL 108 114*  CO2 25 24  GLUCOSE 116* 100*  BUN 47* 49*  CREATININE 2.46* 2.40*  CALCIUM 8.8* 8.2*   GFR: Estimated Creatinine Clearance: 23.3 mL/min (A) (by C-G formula based on SCr of 2.4 mg/dL (H)). Liver Function Tests: Recent Labs  Lab 02/11/22 1255 02/12/22 0450  AST 30 24  ALT 22 20  ALKPHOS 70 61  BILITOT 1.6* 1.3*  PROT 6.2* 5.5*  ALBUMIN 3.2* 2.8*   No results for input(s): "LIPASE", "AMYLASE" in the last 168 hours. No results for input(s): "AMMONIA" in the last 168 hours. Coagulation Profile: Recent Labs  Lab 02/11/22 1947 02/12/22 0450  INR 1.2 1.2   Cardiac Enzymes: No results for input(s): "CKTOTAL", "CKMB", "CKMBINDEX", "TROPONINI" in the last 168 hours. BNP (last 3 results) No results for input(s): "PROBNP" in the last 8760 hours. HbA1C: Recent Labs    02/11/22 1947  HGBA1C 6.0*   CBG: No results for input(s): "GLUCAP" in the last 168 hours. Lipid Profile: No results for input(s): "CHOL", "HDL", "LDLCALC", "TRIG", "CHOLHDL",  "LDLDIRECT" in the last 72 hours. Thyroid Function Tests: No results for input(s): "TSH", "T4TOTAL", "FREET4", "T3FREE", "THYROIDAB" in the last 72 hours. Anemia Panel: No results for input(s): "VITAMINB12", "FOLATE", "FERRITIN", "TIBC", "IRON", "RETICCTPCT" in the last 72 hours. Urine analysis:    Component Value Date/Time   COLORURINE YELLOW (A) 02/12/2022 0940   APPEARANCEUR CLEAR (A) 02/12/2022 0940   LABSPEC 1.020 02/12/2022 0940   PHURINE 5.0 02/12/2022 0940   GLUCOSEU NEGATIVE 02/12/2022 0940   HGBUR NEGATIVE 02/12/2022 0940   BILIRUBINUR NEGATIVE 02/12/2022 0940   KETONESUR 5 (A) 02/12/2022 0940   PROTEINUR NEGATIVE 02/12/2022 0940   NITRITE NEGATIVE 02/12/2022 0940   LEUKOCYTESUR NEGATIVE 02/12/2022 0940   Sepsis Labs: '@LABRCNTIP'$ (procalcitonin:4,lacticidven:4)  No results found for this or any previous visit (from the past 240 hour(s)).       Radiology Studies: US Venous Img Lower Unilateral Left  Result Date: 02/11/2022 CLINICAL DATA:  Swelling and pain EXAM: Left LOWER EXTREMITY VENOUS DOPPLER ULTRASOUND TECHNIQUE: Gray-scale sonography with graded compression, as well as color Doppler and duplex ultrasound were performed to evaluate the lower extremity deep venous systems from the level of the common femoral vein and including the common femoral, femoral, profunda femoral, popliteal and calf veins including the posterior tibial, peroneal and gastrocnemius veins when visible. The superficial great saphenous vein was also interrogated. Spectral Doppler was utilized to evaluate flow at rest and with distal augmentation maneuvers in the common femoral, femoral and popliteal veins. COMPARISON:  None Available. FINDINGS: Contralateral Common Femoral Vein: Respiratory phasicity is normal and symmetric with the symptomatic side. No evidence of thrombus. Normal compressibility. Common Femoral Vein: No evidence of thrombus. Normal compressibility, respiratory phasicity and response  to augmentation. Saphenofemoral Junction: No evidence of thrombus. Normal compressibility and flow on color Doppler imaging. Profunda Femoral Vein: No evidence of thrombus. Normal compressibility and flow on color Doppler imaging. Femoral Vein: No evidence of thrombus. Normal compressibility, respiratory phasicity and response to augmentation. Popliteal Vein: Positive for occlusive thrombus.  Noncompressible. Calf Veins: Positive for occlusive thrombus within the posterior tibial and peroneal veins. Noncompressible. IMPRESSION: Positive for acute occlusive DVT involving the calf veins and popliteal vein. Critical Value/emergent results were called by telephone at the time of interpretation on 02/11/2022 at 5:48 pm to provider [facility] , who verbally acknowledged these results. Electronically  Signed   By: Donavan Foil M.D.   On: 02/11/2022 17:48   DG Chest 2 View  Result Date: 02/11/2022 CLINICAL DATA:  Shortness of breath x2 weeks EXAM: CHEST - 2 VIEW COMPARISON:  10/24/2019 FINDINGS: Cardiac size is within normal limits. There are no signs of pulmonary edema or focal pulmonary consolidation. There is no pleural effusion or pneumothorax. There is minimal blunting of right lateral CP angle. Pacemaker battery is seen in the left infraclavicular region. There is no pneumothorax. IMPRESSION: There are no signs of pulmonary edema or focal pulmonary consolidation. Blunting of right lateral CP angle may suggest minimal effusion or pleural thickening. Electronically Signed   By: Elmer Picker M.D.   On: 02/11/2022 13:42            LOS: 1 day   Time spent: 50 mins    Emeterio Reeve, DO [facility] 02/12/2022, 12:34 PM   Staff may message me via secure chat in [place]  but this may not receive immediate response,  please page for urgent matters!  If 7PM-7AM, please contact night-coverage [email or web address]  Dictation software was used to generate the above note. Typos may occur and  escape review, as with typed/written notes. Please contact Dr Sheppard Coil directly for clarity if needed.

## 2022-02-12 NOTE — Progress Notes (Signed)
Patient admitted to the unit and a skin assessment was completed by myself and [facility] manager. The patient has some scattered bruising. No other skin issues are noted at this time.

## 2022-02-12 NOTE — Consult Note (Signed)
[place] for IV Heparin Indication: DVT / PE  Patient Measurements: Height: '5\' 9"'$  (175.3 cm) Weight: 75.1 kg (165 lb 9.1 oz) IBW/kg (Calculated) : 70.7 Heparin Dosing Weight: 83.5  Labs: Recent Labs    02/11/22 1255 02/11/22 1947 02/12/22 0450  HGB 15.2 15.1 14.1  HCT 45.8  --  42.6  PLT 53*  --  35*  APTT  --  31  --   LABPROT  --  15.1 14.9  INR  --  1.2 1.2  HEPARINUNFRC  --   --  0.58  CREATININE 2.46*  --  2.40*  TROPONINIHS 14  --   --      Estimated Creatinine Clearance: 23.3 mL/min (A) (by C-G formula based on SCr of 2.4 mg/dL (H)).   Medical History: Past Medical History:  Diagnosis Date   Arthritis    Cancer ([place])    COPD (chronic obstructive pulmonary disease) (HCC)    Hoarseness of voice    Hypertension    MRSA (methicillin resistant staph aureus) culture positive    Sick sinus syndrome (HCC)    Sleep apnea    Tremor     Medications:  No anticoagulation prior to admission per my chart review  Assessment: Patient is a 86 y/o M with medical history as above who presented to the ED 8/23 from PCP office with SOB x 2 weeks. Reported recent diastolic CHF diagnosis and diuretic initiation. Endorsing left foot swelling. Patient subsequently found to have LLE DVT. Pharmacy consulted to initiate and manage heparin infusion for VTE treatment.  Baseline CBC notable for thrombocytopenia (platelet count 53k). Baseline aPTT and PT-INR are pending.   Goal of Therapy:  Heparin level 0.3 - 0.5 units/ml (no bolus dosing given thrombocytopenia) Monitor platelets by anticoagulation protocol: Yes  8/24 0450 HL 0.58, therapeutic x 1   Plan:  --Continue heparin infusion at 1200 units/hr --Recheck HL in 8 hr to confirm --Daily CBC per protocol while on IV heparin  [name], PharmD, [facility] 02/12/2022 5:56 AM

## 2022-02-12 NOTE — Assessment & Plan Note (Addendum)
Prerenal based on FeNa, likely dehydration UA no UTI/casts  Holding nephrotoxic meds  Awaiting VQ scan for PE  Caution w/ fluid resuscitation d/t hx HFpEF and slightly elevated BNP here though this more likely due to poor renal clearance/age. Pt does not appear to be in heart failure exacerbation at this time based on exam, CXR.   consider renal US / nephro consult if no improvement

## 2022-02-13 ENCOUNTER — Inpatient Hospital Stay: Payer: HMO

## 2022-02-13 DIAGNOSIS — I824Y2 Acute embolism and thrombosis of unspecified deep veins of left proximal lower extremity: Secondary | ICD-10-CM

## 2022-02-13 DIAGNOSIS — D693 Immune thrombocytopenic purpura: Secondary | ICD-10-CM

## 2022-02-13 DIAGNOSIS — G250 Essential tremor: Secondary | ICD-10-CM | POA: Diagnosis not present

## 2022-02-13 DIAGNOSIS — N179 Acute kidney failure, unspecified: Secondary | ICD-10-CM | POA: Diagnosis not present

## 2022-02-13 DIAGNOSIS — I824Z9 Acute embolism and thrombosis of unspecified deep veins of unspecified distal lower extremity: Secondary | ICD-10-CM | POA: Diagnosis not present

## 2022-02-13 DIAGNOSIS — I700 Atherosclerosis of aorta: Secondary | ICD-10-CM | POA: Diagnosis not present

## 2022-02-13 LAB — BASIC METABOLIC PANEL
Anion gap: 7 (ref 5–15)
BUN: 56 mg/dL — ABNORMAL HIGH (ref 8–23)
CO2: 26 mmol/L (ref 22–32)
Calcium: 9.9 mg/dL (ref 8.9–10.3)
Chloride: 109 mmol/L (ref 98–111)
Creatinine, Ser: 1.96 mg/dL — ABNORMAL HIGH (ref 0.61–1.24)
GFR, Estimated: 33 mL/min — ABNORMAL LOW (ref >60)
Glucose, Bld: 160 mg/dL — ABNORMAL HIGH (ref 70–99)
Potassium: 4.4 mmol/L (ref 3.5–5.1)
Sodium: 142 mmol/L (ref 135–145)

## 2022-02-13 LAB — CBC
HCT: 42.8 % (ref 39.0–52.0)
Hemoglobin: 14.4 g/dL (ref 13.0–17.0)
MCH: 31.6 pg (ref 26.0–34.0)
MCHC: 33.6 g/dL (ref 30.0–36.0)
MCV: 94.1 fL (ref 80.0–100.0)
Platelets: 41 10*3/uL — ABNORMAL LOW (ref 150–400)
RBC: 4.55 MIL/uL (ref 4.22–5.81)
RDW: 12.5 % (ref 11.5–15.5)
WBC: 6.8 10*3/uL (ref 4.0–10.5)
nRBC: 0 % (ref 0.0–0.2)

## 2022-02-13 LAB — HEPARIN LEVEL (UNFRACTIONATED)
Heparin Unfractionated: 0.67 IU/mL (ref 0.30–0.70)
Heparin Unfractionated: 0.4 IU/mL (ref 0.30–0.70)
Heparin Unfractionated: 0.49 IU/mL (ref 0.30–0.70)

## 2022-02-13 LAB — BRAIN NATRIURETIC PEPTIDE: B Natriuretic Peptide: 349.9 pg/mL — ABNORMAL HIGH (ref 0.0–100.0)

## 2022-02-13 MED ORDER — ASPIRIN 81 MG PO TBEC
81.0000 mg | DELAYED_RELEASE_TABLET | Freq: Every day | ORAL | Status: DC
Start: 2022-02-13 — End: 2022-02-18
  Administered 2022-02-13 – 2022-02-18 (×6): 81 mg via ORAL
  Filled 2022-02-13 (×6): qty 1

## 2022-02-13 MED ORDER — IMMUNE GLOBULIN (HUMAN) 10 GM/100ML IV SOLN
400.0000 mg/kg | INTRAVENOUS | Status: AC
  Administered 2022-02-13 – 2022-02-17 (×5): 30 g via INTRAVENOUS
  Filled 2022-02-13 (×5): qty 300

## 2022-02-13 NOTE — Consult Note (Signed)
ANTICOAGULATION CONSULT NOTE  Pharmacy Consult for IV Heparin Indication: DVT   Patient Measurements: Height: '5\' 9"'$  (175.3 cm) Weight: 75.1 kg (165 lb 9.1 oz) IBW/kg (Calculated) : 70.7 Heparin Dosing Weight: 83.5 kg  Labs: Recent Labs    02/11/22 1255 02/11/22 1255 02/11/22 1947 02/12/22 0450 02/12/22 1111 02/12/22 1414 02/12/22 2255 02/13/22 0535 02/13/22 1514  HGB 15.2  --  15.1 14.1 14.6 13.9  --  14.4  --   HCT 45.8  --   --  42.6 42.8 41.4  --  42.8  --   PLT 53*  --   --  35* 45* 43*  --  41*  --   APTT  --   --  31  --   --   --   --   --   --   LABPROT  --   --  15.1 14.9  --   --   --   --   --   INR  --   --  1.2 1.2  --   --   --   --   --   HEPARINUNFRC  --    < >  --  0.58  --  0.74* 0.62 0.67 0.40  CREATININE 2.46*  --   --  2.40*  --   --   --  1.96*  --   TROPONINIHS 14  --   --   --   --   --   --   --   --    < > = values in this interval not displayed.     Estimated Creatinine Clearance: 28.6 mL/min (A) (by C-G formula based on SCr of 1.96 mg/dL (H)).   Medical History: Past Medical History:  Diagnosis Date   Arthritis    Cancer ([place])    COPD (chronic obstructive pulmonary disease) (HCC)    Hoarseness of voice    Hypertension    MRSA (methicillin resistant staph aureus) culture positive    Sick sinus syndrome (HCC)    Sleep apnea    Tremor     Medications:  Aspirin 81 PTA  Assessment: Patient is a 86 y/o M with medical history as above who presented to the ED 8/23 from PCP office with SOB x 2 weeks. Reported recent diastolic CHF diagnosis and diuretic initiation. Endorsing left foot swelling. Patient subsequently found to have LLE DVT. Pt was found to have thrombocytopenia (PLT 53k) on arrival to ED, per note from hematology low suspicion for HIT. Per hematology recs, ok to continue heparin drip. Started on dexamethasone 20 mg daily for thrombocytopenia. Pt was started on heparin drip without bolus due to thrombocytopenia. Patient was not  on anticoagulation prior to admission but does take aspirin 81 mg daily. V/Q scan was normal, no evidence of PE. Pharmacy consulted to initiate and manage heparin infusion for VTE treatment.  Hgb and HCT stable, PLT 54>>35>>43  Goal of Therapy:  Heparin level 0.3 - 0.5 units/ml (no bolus dosing given thrombocytopenia) Monitor platelets by anticoagulation protocol: Yes  8/24 0450 HL 0.58 8/24 1414 HL 0.74, supratherapeutic 8/24 2255 HL 0.62 8/25 0535 HL 0.67 8/25 1514 HL 0.40 therapeutic x1  Per protocol, decrease by 2-3 units/kg/hr due to lower goal of 0.3-0.5   Plan:  --HL therapeutic x1 --Recheck HL in 8 hrs after rate change --Daily CBC while on heparin --Monitor PLT due to thrombocytopenia

## 2022-02-13 NOTE — TOC CM/SW Note (Signed)
Patient currently on 2 L acute oxygen. Following for potential home oxygen needs.   Darren Gonzales, [place]

## 2022-02-13 NOTE — Progress Notes (Signed)
Patient's family is at bedside at start of shift.

## 2022-02-13 NOTE — Progress Notes (Signed)
Patient's new rate dose change for Heparin is 8.5 ml/hr. Rate dose change and hand off completed with Gwenette Greet, RN at 7:09 am

## 2022-02-13 NOTE — Progress Notes (Signed)
[facility] [employer]) states that MRI can be done if patient is transferred to [facility] where there are pacemaker compatible equipment and MRI nurses/ supervisors to monitor. Provider on call, Dr. Sidney Ace has been contacted.   Dr. Sidney Ace has requested that MRI be followed up on in the morning by ordering provider (Dr. Sheppard Coil) to decide if and, or when patient should be transferred if MRI will be done (since it cannot be done at [facility]).  Dr. Sidney Ace has reviewed patient's chart and has ordered a dose of Aspirin to manage any potential patient symptoms if there are any tonight.    Will continue to monitor and assess patient.

## 2022-02-13 NOTE — Progress Notes (Signed)
LALO TROMP   DOB:[birth year]   LK#:[id number]    Subjective: Patient denies any nausea vomiting.  Denies any headaches.  Denies any bleeding gums or nose.  Pain in the leg improved.  Continues to complain of shortness of breath on exertion  Objective:  Vitals:   02/13/22 1615 02/13/22 1925  BP: (!) 120/98 129/79  Pulse: 68 62  Resp: 20 20  Temp: 98.3 F (36.8 C) (!) 97.5 F (36.4 C)  SpO2: 95% 95%     Intake/Output Summary (Last 24 hours) at 02/13/2022 2146 Last data filed at 02/13/2022 1700 Gross per 24 hour  Intake 957 ml  Output 700 ml  Net 257 ml  Patient currently on 2 L of nasal cannula oxygen.  Physical Exam Vitals and nursing note reviewed.  HENT:     Head: Normocephalic and atraumatic.     Mouth/Throat:     Pharynx: Oropharynx is clear.  Eyes:     Extraocular Movements: Extraocular movements intact.     Pupils: Pupils are equal, round, and reactive to light.  Cardiovascular:     Rate and Rhythm: Normal rate and regular rhythm.  Pulmonary:     Comments: Decreased breath sounds bilaterally.  Abdominal:     Palpations: Abdomen is soft.  Musculoskeletal:        General: Normal range of motion.     Cervical back: Normal range of motion.  Skin:    General: Skin is warm.  Neurological:     General: No focal deficit present.     Mental Status: He is alert and oriented to person, place, and time.  Psychiatric:        Behavior: Behavior normal.        Judgment: Judgment normal.      Labs:  Lab Results  Component Value Date   WBC 6.8 02/13/2022   HGB 14.4 02/13/2022   HCT 42.8 02/13/2022   MCV 94.1 02/13/2022   PLT 41 (L) 02/13/2022   NEUTROABS 5.3 02/12/2022    Lab Results  Component Value Date   NA 142 02/13/2022   K 4.4 02/13/2022   CL 109 02/13/2022   CO2 26 02/13/2022    Studies:  CT HEAD WO CONTRAST (5MM)  Result Date: 02/13/2022 CLINICAL DATA:  Headache, new or worsening. EXAM: CT HEAD WITHOUT CONTRAST TECHNIQUE: Contiguous axial  images were obtained from the base of the skull through the vertex without intravenous contrast. RADIATION DOSE REDUCTION: This exam was performed according to the departmental dose-optimization program which includes automated exposure control, adjustment of the mA and/or kV according to patient size and/or use of iterative reconstruction technique. COMPARISON:  CT head without contrast 12/22/2021 FINDINGS: Brain: No acute infarct, hemorrhage, or mass lesion is present. Mild atrophy and white matter changes are within normal limits for age. Basal ganglia are within normal limits. Insular ribbon is normal. No acute or focal cortical abnormality is present. The ventricles are of normal size. No significant extraaxial fluid collection is present. The brainstem and cerebellum are within normal limits. Vascular: Atherosclerotic calcifications are present within the cavernous internal carotid arteries bilaterally. No hyperdense vessel is present. Skull: Calvarium is intact. No focal lytic or blastic lesions are present. No significant extracranial soft tissue lesion is present. Sinuses/Orbits: The paranasal sinuses and mastoid air cells are clear. Bilateral lens replacements are noted. Globes and orbits are otherwise unremarkable. IMPRESSION: Negative CT of the head for age. Electronically Signed   By: San Morelle M.D.   On: 02/13/2022 18:46  NM Pulmonary Perfusion  Result Date: 02/12/2022 CLINICAL DATA:  Chest pain EXAM: NUCLEAR MEDICINE PERFUSION LUNG SCAN TECHNIQUE: Perfusion images were obtained in multiple projections after intravenous injection of radiopharmaceutical. Ventilation scans intentionally deferred if perfusion scan and chest x-ray adequate for interpretation during COVID 19 epidemic. RADIOPHARMACEUTICALS:  4.4 mCi Tc-70mMAA IV COMPARISON:  Chest radiograph done on 02/11/2022 FINDINGS: There are no segmental or subsegmental wedge-shaped perfusion defects. IMPRESSION: Normal perfusion lung  scan. Electronically Signed   By: PElmer PickerM.D.   On: 02/12/2022 [id number]   86year old male patient with multiple medical problems including CAD/CHF chronic kidney disease stage IV is currently admitted to hospital for right lower extremity DVT; incidentally noted to have platelets of 35 on admission.   # Severe thrombocytopenia-platelets 35-40s-etiology is unclear however clinically suspicious of ITP.  Currently on dexamethasone 20 mg once a day day #2 today.  Platelets today 43.  Proceed with IVIG infusion 400 mg per metered square per day x3 to 5 days depending upon response.  No evidence of bleeding noted.   # Acute DVT of the LEFT lower extremity-continue IV heparin without bolus-given acute thrombocytopenia.  See above   # Orthopnea/short of breath on exertion- VQ scan= negative for PE.  Suspect cardiac/pulmonary cause.  As per hospitalist service.   #CKD-stage IV stable   Discussed with pharmacist.  Discussed with Dr. ASheppard Coil  I also spoke to the patient's son over the phone updated on the treatment plan.  All questions were answered.   GCammie Sickle MD 02/13/2022  9:46 PM

## 2022-02-13 NOTE — Progress Notes (Signed)
An order was placed for an MRI for this patient. Patient has a pacemaker with [employer]. Called MRI at [phone] and spoke with [facility]. Sent Dr. Sheppard Coil a message, but Rolla Plate mentions that Dr. Sheppard Coil put the order in at change of shift. Will contact the current provider on call.

## 2022-02-13 NOTE — Consult Note (Signed)
ANTICOAGULATION CONSULT NOTE  Pharmacy Consult for IV Heparin Indication: DVT   Patient Measurements: Height: '5\' 9"'$  (175.3 cm) Weight: 75.1 kg (165 lb 9.1 oz) IBW/kg (Calculated) : 70.7 Heparin Dosing Weight: 83.5 kg  Labs: Recent Labs    02/11/22 1255 02/11/22 1255 02/11/22 1947 02/12/22 0450 02/12/22 1111 02/12/22 1414 02/12/22 2255 02/13/22 0535  HGB 15.2  --  15.1 14.1 14.6 13.9  --  14.4  HCT 45.8  --   --  42.6 42.8 41.4  --  42.8  PLT 53*  --   --  35* 45* 43*  --  41*  APTT  --   --  31  --   --   --   --   --   LABPROT  --   --  15.1 14.9  --   --   --   --   INR  --   --  1.2 1.2  --   --   --   --   HEPARINUNFRC  --    < >  --  0.58  --  0.74* 0.62 0.67  CREATININE 2.46*  --   --  2.40*  --   --   --  1.96*  TROPONINIHS 14  --   --   --   --   --   --   --    < > = values in this interval not displayed.     Estimated Creatinine Clearance: 28.6 mL/min (A) (by C-G formula based on SCr of 1.96 mg/dL (H)).   Medical History: Past Medical History:  Diagnosis Date   Arthritis    Cancer ([place])    COPD (chronic obstructive pulmonary disease) (HCC)    Hoarseness of voice    Hypertension    MRSA (methicillin resistant staph aureus) culture positive    Sick sinus syndrome (HCC)    Sleep apnea    Tremor     Medications:  Aspirin 81 PTA  Assessment: Patient is a 86 y/o M with medical history as above who presented to the ED 8/23 from PCP office with SOB x 2 weeks. Reported recent diastolic CHF diagnosis and diuretic initiation. Endorsing left foot swelling. Patient subsequently found to have LLE DVT. Pt was found to have thrombocytopenia (PLT 53k) on arrival to ED, per note from hematology low suspicion for HIT. Per hematology recs, ok to continue heparin drip. Started on dexamethasone 20 mg daily for thrombocytopenia. Pt was started on heparin drip without bolus due to thrombocytopenia. Patient was not on anticoagulation prior to admission but does take aspirin  81 mg daily. V/Q scan was normal, no evidence of PE. Pharmacy consulted to initiate and manage heparin infusion for VTE treatment.  Hgb and HCT stable, PLT 54>>35>>43  Goal of Therapy:  Heparin level 0.3 - 0.5 units/ml (no bolus dosing given thrombocytopenia) Monitor platelets by anticoagulation protocol: Yes  8/24 0450 HL 0.58 8/24 1414 HL 0.74, supratherapeutic 8/24 2255 HL 0.62 8/25 0535 HL 0.67  Per protocol, decrease by 2-3 units/kg/hr due to lower goal of 0.3-0.5   Plan:  --Decrease heparin infusion to 850 units/hr --Recheck HL in 8 hrs after rate change --Daily CBC while on heparin --Monitor PLT due to thrombocytopenia

## 2022-02-13 NOTE — Progress Notes (Signed)
Copy of patient's Pacemaker card placed in patient's chart.

## 2022-02-13 NOTE — Progress Notes (Signed)
PROGRESS NOTE    Darren Gonzales   JZP:[id number] DOB: 1936-02-25  DOA: 02/11/2022 Date of Service: 02/13/22 PCP: Rusty Aus, MD     Brief Narrative / Hospital Course:  Darren Gonzales is a 86 y.o. male with medical history significant of COPD, HTN, SSS s/p pacemaker,  OSA intolerant to CPAP, HFpEF (EF 50-55%), CKDIIIb, Gout, who presents to ED 02/11/22 from PCP office with complaints of 2 weeks of persistent sob with associated lower extremity swelling. He has also noted associated orthopnea, fatigue, as well chest pain unable to describe but notes it is intermittent. Assoc w/ chills, as well as coughing nonproductive.   He also was concerned about side-effect of gabapentin and was told to hold this medication by PCP as well as hold his amlodipine. He was also recently started on lasix 20 mg 3 days PTA. 08/23: (+)DVT LLE, low Plt, AKI w/ Cr 2.46 on CKD3b baseline Cr 1.9. BNP 414. A1C 6.0. Admitted to hospitalist service for DVT. Started heparin gtt w/o bolus, await VQ d/t AKI.  08/24: Cr 2.40. FeNa c/w pre-renal cause, UA no UTI, (+)ketones c/w dehydration. Hematology recs: see lab orders. Pt does not appear to be in heart failure exacerbation at this time based on exam, labs, CXR. VQ study normal. Continuing heparin drip.  08/25: Cr 1.96. HemOnc dx ITP, administering IVIG today. BNP trending down and no CHF exacerbation based on exam, still needing O2, will trial wean off this.    Consultants: Vascular surgery (Dr Lucky Cowboy) for DVT possible PE HemOnc (Dr Rogue Bussing) for thrombocytopenia   Procedures:  None    Subjective: Patient reports feeling less short winded today, no CP, no dizziness. Three sons at bedside, no other concerns.      ASSESSMENT & PLAN:   Principal Problem:   DVT (deep venous thrombosis) (HCC) Active Problems:   Aortic atherosclerosis (HCC)   Benign essential tremor   Chronic diastolic CHF (congestive heart failure), NYHA class 3 (HCC)   Stage 3b  chronic kidney disease (CKD) (HCC)   Essential hypertension   S/P placement of cardiac pacemaker   Sinoatrial node dysfunction (HCC)   Acute kidney injury superimposed on chronic kidney disease stage 3b (HCC)   Gout   Thrombocytopenia (HCC)   Malnutrition of moderate degree   Shortness of breath   DVT (deep venous thrombosis) (HCC) Heparin drip w/o bolus d/t low Plt  VQ no concern for PE Vascular surgery and hematology consulted   Acute kidney injury superimposed on chronic kidney disease stage 3b ([place]) Prerenal based on FeNa, likely dehydration UA no UTI/casts IMPROVING  Holding nephrotoxic meds Caution w/ fluid resuscitation d/t hx HFpEF and slightly elevated BNP here though this more likely due to poor renal clearance/age. Pt does not appear to be in heart failure exacerbation at this time based on exam, CXR. BNP actually trending down  consider renal US / nephro consult if no improvement   Thrombocytopenia (HCC) HemOnc following - workup pending but suspect ITP -->  dexamethasone 20 mg p.o. daily x 4 days or so hold platelet transfusion unless platelets keep trending down to 30 or if bleeding IVIG infusion planned Continue heparin for now Close monitor CBC   Shortness of breath Tachypnea w/o tachcyardia but he has SSS / pacemaker so may not have tachycardia  No drop in SpO2 but pt is comfortable on 2L/min [place] CXR no concerns for infection or fluid overload Continue O2 --> wean as tolerated  Added BNP again to trend, if  gong up this would reasonably give concern for CHF exacerbation as cause of SOB though diuresis will be difficult given AKI. BNP actually trending down today   Chronic diastolic CHF (congestive heart failure), NYHA class 3 (HCC) Etiology HTN / OSA  HFoEF 50-55% Slight elevation BNP in ED but appears fluid compensated, CXR neg home meds based on last cardiology note 11/2021: isosorbide ER 30 mg, propranolol ER 240 mg qhs, pravastatin 20 mg - pt reports not  taking statin  Cont isosorbide added rosuvastatin lowered dose propranolol d/t borderline BP Pt does not appear to be in heart failure exacerbation at this time based on exam, labs, CXR. If this changes, would get urgent echo to assess R heart strain but VQ shows no concern for PE, may consider Echo either way if SOB not improving  Added BNP again to trend, if gong up this would reasonably give concern for CHF exacerbation as cause of SOB though diuresis will be difficult given AKI. BNP actually trending down today   Essential hypertension home meds based on last cardiology note 11/2021: isosorbide ER 30 mg, propranolol ER 240 mg qhs, pravastatin 20 mg  Benign essential tremor On propranolol and primidone at home Primidone held d/t AKI Cont propranolol but caution w/ low BP  Gout Holding colchicine d/t AKI  S/P placement of cardiac pacemaker D/t SSS Telemetry for now      DVT prophylaxis: heparin Code Status: FULL Family Communication: sons at bedside Disposition Plan / TOC needs: home when stabilized Barriers to discharge / significant pending items: vascular recs, heparin drip, IVIG ordered, still on O2, expect discharge in approx 2 days             Objective: Vitals:   02/12/22 2330 02/13/22 0234 02/13/22 0433 02/13/22 0743  BP: 118/78 (!) 153/91 137/81 (!) 132/97  Pulse: 63  (!) 59 63  Resp: '16  18 19  '$ Temp: 98 F (36.7 C) 97.6 F (36.4 C) 97.9 F (36.6 C) 98.3 F (36.8 C)  TempSrc: Oral Oral    SpO2: 96% 98% 97% 94%  Weight:      Height:        Intake/Output Summary (Last 24 hours) at 02/13/2022 1351 Last data filed at 02/13/2022 0900 Gross per 24 hour  Intake 717 ml  Output 550 ml  Net 167 ml   Filed Weights   02/11/22 1254 02/11/22 2148  Weight: 83.5 kg 75.1 kg    Examination:  Constitutional:  VS as above General Appearance: alert, well-developed, well-nourished, NAD Eyes: Normal lids and conjunctive, non-icteric sclera Ears, Nose,  Mouth, Throat: Normal external appearance MMM Neck: No masses, trachea midline, no JVD or carotid bruit Respiratory: Normal respiratory effort No wheeze No rhonchi No rales Cardiovascular: S1/S2 normal, RRR No murmur Question gallop vs psychologic split  No JVD No lower extremity edema Gastrointestinal: No tenderness No masses Musculoskeletal:  No clubbing/cyanosis of digits Symmetrical movement in all extremities Neurological: No cranial nerve deficit on limited exam Alert Psychiatric: Normal judgment/insight Normal mood and affect       Scheduled Medications:   dexamethasone  20 mg Oral Daily   feeding supplement  237 mL Oral TID BM   isosorbide mononitrate  30 mg Oral Daily   multivitamin with minerals  1 tablet Oral Daily   pantoprazole  40 mg Oral Daily   propranolol ER  180 mg Oral Daily   rosuvastatin  10 mg Oral QHS    Continuous Infusions:  heparin 850 Units/hr (02/13/22 0707)  IMMUNE GLOBULIN 10% (HUMAN) IV - For Fluid Restriction Only      PRN Medications:  acetaminophen **OR** acetaminophen, albuterol, ondansetron **OR** ondansetron (ZOFRAN) IV  Antimicrobials:  Anti-infectives (From admission, onward)    None       Data Reviewed: I have personally reviewed following labs and imaging studies  CBC: Recent Labs  Lab 02/11/22 1255 02/11/22 1947 02/12/22 0450 02/12/22 1111 02/12/22 1414 02/13/22 0535  WBC 9.9  --  7.6 7.7 6.6 6.8  NEUTROABS  --   --   --  4.6 5.3  --   HGB 15.2 15.1 14.1 14.6 13.9 14.4  HCT 45.8  --  42.6 42.8 41.4 42.8  MCV 95.8  --  95.1 93.4 94.1 94.1  PLT 53*  --  35* 45* 43* 41*   Basic Metabolic Panel: Recent Labs  Lab 02/11/22 1255 02/12/22 0450 02/13/22 0535  NA 142 143 142  K 4.2 4.1 4.4  CL 108 114* 109  CO2 '25 24 26  '$ GLUCOSE 116* 100* 160*  BUN 47* 49* 56*  CREATININE 2.46* 2.40* 1.96*  CALCIUM 8.8* 8.2* 9.9   GFR: Estimated Creatinine Clearance: 28.6 mL/min (A) (by C-G formula based on  SCr of 1.96 mg/dL (H)). Liver Function Tests: Recent Labs  Lab 02/11/22 1255 02/12/22 0450  AST 30 24  ALT 22 20  ALKPHOS 70 61  BILITOT 1.6* 1.3*  PROT 6.2* 5.5*  ALBUMIN 3.2* 2.8*   No results for input(s): "LIPASE", "AMYLASE" in the last 168 hours. No results for input(s): "AMMONIA" in the last 168 hours. Coagulation Profile: Recent Labs  Lab 02/11/22 1947 02/12/22 0450  INR 1.2 1.2   Cardiac Enzymes: No results for input(s): "CKTOTAL", "CKMB", "CKMBINDEX", "TROPONINI" in the last 168 hours. BNP (last 3 results) No results for input(s): "PROBNP" in the last 8760 hours. HbA1C: Recent Labs    02/11/22 1947  HGBA1C 6.0*   CBG: No results for input(s): "GLUCAP" in the last 168 hours. Lipid Profile: No results for input(s): "CHOL", "HDL", "LDLCALC", "TRIG", "CHOLHDL", "LDLDIRECT" in the last 72 hours. Thyroid Function Tests: No results for input(s): "TSH", "T4TOTAL", "FREET4", "T3FREE", "THYROIDAB" in the last 72 hours. Anemia Panel: No results for input(s): "VITAMINB12", "FOLATE", "FERRITIN", "TIBC", "IRON", "RETICCTPCT" in the last 72 hours. Urine analysis:    Component Value Date/Time   COLORURINE YELLOW (A) 02/12/2022 0940   APPEARANCEUR CLEAR (A) 02/12/2022 0940   LABSPEC 1.020 02/12/2022 0940   PHURINE 5.0 02/12/2022 0940   GLUCOSEU NEGATIVE 02/12/2022 0940   HGBUR NEGATIVE 02/12/2022 0940   BILIRUBINUR NEGATIVE 02/12/2022 0940   KETONESUR 5 (A) 02/12/2022 0940   PROTEINUR NEGATIVE 02/12/2022 0940   NITRITE NEGATIVE 02/12/2022 0940   LEUKOCYTESUR NEGATIVE 02/12/2022 0940   Sepsis Labs: '@LABRCNTIP'$ (procalcitonin:4,lacticidven:4)  No results found for this or any previous visit (from the past 240 hour(s)).       Radiology Studies: US Venous Img Lower Unilateral Left  Result Date: 02/11/2022 CLINICAL DATA:  Swelling and pain EXAM: Left LOWER EXTREMITY VENOUS DOPPLER ULTRASOUND TECHNIQUE: Gray-scale sonography with graded compression, as well as color  Doppler and duplex ultrasound were performed to evaluate the lower extremity deep venous systems from the level of the common femoral vein and including the common femoral, femoral, profunda femoral, popliteal and calf veins including the posterior tibial, peroneal and gastrocnemius veins when visible. The superficial great saphenous vein was also interrogated. Spectral Doppler was utilized to evaluate flow at rest and with distal augmentation maneuvers in the common femoral, femoral  and popliteal veins. COMPARISON:  None Available. FINDINGS: Contralateral Common Femoral Vein: Respiratory phasicity is normal and symmetric with the symptomatic side. No evidence of thrombus. Normal compressibility. Common Femoral Vein: No evidence of thrombus. Normal compressibility, respiratory phasicity and response to augmentation. Saphenofemoral Junction: No evidence of thrombus. Normal compressibility and flow on color Doppler imaging. Profunda Femoral Vein: No evidence of thrombus. Normal compressibility and flow on color Doppler imaging. Femoral Vein: No evidence of thrombus. Normal compressibility, respiratory phasicity and response to augmentation. Popliteal Vein: Positive for occlusive thrombus.  Noncompressible. Calf Veins: Positive for occlusive thrombus within the posterior tibial and peroneal veins. Noncompressible. IMPRESSION: Positive for acute occlusive DVT involving the calf veins and popliteal vein. Critical Value/emergent results were called by telephone at the time of interpretation on 02/11/2022 at 5:48 pm to provider [facility] , who verbally acknowledged these results. Electronically Signed   By: Donavan Foil M.D.   On: 02/11/2022 17:48   DG Chest 2 View  Result Date: 02/11/2022 CLINICAL DATA:  Shortness of breath x2 weeks EXAM: CHEST - 2 VIEW COMPARISON:  10/24/2019 FINDINGS: Cardiac size is within normal limits. There are no signs of pulmonary edema or focal pulmonary consolidation. There is no  pleural effusion or pneumothorax. There is minimal blunting of right lateral CP angle. Pacemaker battery is seen in the left infraclavicular region. There is no pneumothorax. IMPRESSION: There are no signs of pulmonary edema or focal pulmonary consolidation. Blunting of right lateral CP angle may suggest minimal effusion or pleural thickening. Electronically Signed   By: Elmer Picker M.D.   On: 02/11/2022 13:42            LOS: 2 days   Time spent: 50 mins    Emeterio Reeve, DO [facility] 02/13/2022, 1:51 PM   Staff may message me via secure chat in [place]  but this may not receive immediate response,  please page for urgent matters!  If 7PM-7AM, please contact night-coverage [email or web address]  Dictation software was used to generate the above note. Typos may occur and escape review, as with typed/written notes. Please contact Dr Sheppard Coil directly for clarity if needed.

## 2022-02-13 NOTE — Care Management Important Message (Signed)
Important Message  Patient Details  Name: Darren Gonzales MRN: [id number] Date of Birth: 03/28/1936   Medicare Important Message Given:  N/A - LOS <3 / Initial given by admissions     Dannette Barbara 02/13/2022, 8:34 AM

## 2022-02-13 NOTE — Progress Notes (Signed)
Patient is alert and oriented at start of shift. Family at bedside. Updates given to them on a request for an MRI. Patient is calm and cooperative. Heparin going at 8.5 ml/hr. No other changes noted.   Change of shift report: Patient complained of blurry vision. CT scan was performed earlier in the day and was negative. MRI order placed at change of shift by Dr. Sheppard Coil.

## 2022-02-14 DIAGNOSIS — N179 Acute kidney failure, unspecified: Secondary | ICD-10-CM | POA: Diagnosis not present

## 2022-02-14 DIAGNOSIS — D693 Immune thrombocytopenic purpura: Secondary | ICD-10-CM | POA: Diagnosis not present

## 2022-02-14 DIAGNOSIS — I824Z9 Acute embolism and thrombosis of unspecified deep veins of unspecified distal lower extremity: Secondary | ICD-10-CM | POA: Diagnosis not present

## 2022-02-14 DIAGNOSIS — I824Y2 Acute embolism and thrombosis of unspecified deep veins of left proximal lower extremity: Secondary | ICD-10-CM | POA: Diagnosis not present

## 2022-02-14 DIAGNOSIS — G250 Essential tremor: Secondary | ICD-10-CM | POA: Diagnosis not present

## 2022-02-14 DIAGNOSIS — I700 Atherosclerosis of aorta: Secondary | ICD-10-CM | POA: Diagnosis not present

## 2022-02-14 LAB — BASIC METABOLIC PANEL
Anion gap: 3 — ABNORMAL LOW (ref 5–15)
BUN: 57 mg/dL — ABNORMAL HIGH (ref 8–23)
CO2: 22 mmol/L (ref 22–32)
Calcium: 9.4 mg/dL (ref 8.9–10.3)
Chloride: 112 mmol/L — ABNORMAL HIGH (ref 98–111)
Creatinine, Ser: 1.79 mg/dL — ABNORMAL HIGH (ref 0.61–1.24)
GFR, Estimated: 37 mL/min — ABNORMAL LOW (ref >60)
Glucose, Bld: 153 mg/dL — ABNORMAL HIGH (ref 70–99)
Potassium: 4.3 mmol/L (ref 3.5–5.1)
Sodium: 137 mmol/L (ref 135–145)

## 2022-02-14 LAB — GLUCOSE, CAPILLARY
Glucose-Capillary: 129 mg/dL — ABNORMAL HIGH (ref 70–99)
Glucose-Capillary: 136 mg/dL — ABNORMAL HIGH (ref 70–99)
Glucose-Capillary: 174 mg/dL — ABNORMAL HIGH (ref 70–99)
Glucose-Capillary: 214 mg/dL — ABNORMAL HIGH (ref 70–99)

## 2022-02-14 LAB — CBC
HCT: 37.8 % — ABNORMAL LOW (ref 39.0–52.0)
Hemoglobin: 12.7 g/dL — ABNORMAL LOW (ref 13.0–17.0)
MCH: 31.1 pg (ref 26.0–34.0)
MCHC: 33.6 g/dL (ref 30.0–36.0)
MCV: 92.4 fL (ref 80.0–100.0)
Platelets: 40 10*3/uL — ABNORMAL LOW (ref 150–400)
RBC: 4.09 MIL/uL — ABNORMAL LOW (ref 4.22–5.81)
RDW: 12.5 % (ref 11.5–15.5)
WBC: 7.9 10*3/uL (ref 4.0–10.5)
nRBC: 0.3 % — ABNORMAL HIGH (ref 0.0–0.2)

## 2022-02-14 LAB — HEPARIN LEVEL (UNFRACTIONATED)
Heparin Unfractionated: 0.69 IU/mL (ref 0.30–0.70)
Heparin Unfractionated: 0.68 IU/mL (ref 0.30–0.70)

## 2022-02-14 MED ORDER — DEXAMETHASONE 6 MG PO TABS
40.0000 mg | ORAL_TABLET | Freq: Every day | ORAL | Status: DC
  Administered 2022-02-15 – 2022-02-18 (×4): 40 mg via ORAL
  Filled 2022-02-14 (×4): qty 1

## 2022-02-14 MED ORDER — DEXAMETHASONE 6 MG PO TABS
20.0000 mg | ORAL_TABLET | Freq: Once | ORAL | Status: AC
  Administered 2022-02-14: 20 mg via ORAL
  Filled 2022-02-14: qty 2

## 2022-02-14 NOTE — Progress Notes (Signed)
Darren Gonzales   DOB:1935/11/14   IO#:[id number]    Subjective: over nite pt reported head ache. CT brain negative for acute process.   Patient denies any nausea vomiting.    Denies any bleeding gums or nose.  Pain in the leg improved.  Continues to complain of shortness of breath on exertion  Objective:  Vitals:   02/14/22 1050 02/14/22 1110  BP:  (!) 152/84  Pulse: 68 64  Resp:  17  Temp:  (!) 96.5 F (35.8 C)  SpO2: 91% 97%     Intake/Output Summary (Last 24 hours) at 02/14/2022 1503 Last data filed at 02/14/2022 0954 Gross per 24 hour  Intake 714 ml  Output 650 ml  Net 64 ml  Patient currently on 2 L of nasal cannula oxygen.  Physical Exam Vitals and nursing note reviewed.  HENT:     Head: Normocephalic and atraumatic.     Mouth/Throat:     Pharynx: Oropharynx is clear.  Eyes:     Extraocular Movements: Extraocular movements intact.     Pupils: Pupils are equal, round, and reactive to light.  Cardiovascular:     Rate and Rhythm: Normal rate and regular rhythm.  Pulmonary:     Comments: Decreased breath sounds bilaterally.  Abdominal:     Palpations: Abdomen is soft.  Musculoskeletal:        General: Normal range of motion.     Cervical back: Normal range of motion.  Skin:    General: Skin is warm.  Neurological:     General: No focal deficit present.     Mental Status: He is alert and oriented to person, place, and time.  Psychiatric:        Behavior: Behavior normal.        Judgment: Judgment normal.      Labs:  Lab Results  Component Value Date   WBC 7.9 02/14/2022   HGB 12.7 (L) 02/14/2022   HCT 37.8 (L) 02/14/2022   MCV 92.4 02/14/2022   PLT 40 (L) 02/14/2022   NEUTROABS 5.3 02/12/2022    Lab Results  Component Value Date   NA 137 02/14/2022   K 4.3 02/14/2022   CL 112 (H) 02/14/2022   CO2 22 02/14/2022    Studies:  CT HEAD WO CONTRAST (5MM)  Result Date: 02/13/2022 CLINICAL DATA:  Headache, new or worsening. EXAM: CT HEAD WITHOUT  CONTRAST TECHNIQUE: Contiguous axial images were obtained from the base of the skull through the vertex without intravenous contrast. RADIATION DOSE REDUCTION: This exam was performed according to the departmental dose-optimization program which includes automated exposure control, adjustment of the mA and/or kV according to patient size and/or use of iterative reconstruction technique. COMPARISON:  CT head without contrast 12/22/2021 FINDINGS: Brain: No acute infarct, hemorrhage, or mass lesion is present. Mild atrophy and white matter changes are within normal limits for age. Basal ganglia are within normal limits. Insular ribbon is normal. No acute or focal cortical abnormality is present. The ventricles are of normal size. No significant extraaxial fluid collection is present. The brainstem and cerebellum are within normal limits. Vascular: Atherosclerotic calcifications are present within the cavernous internal carotid arteries bilaterally. No hyperdense vessel is present. Skull: Calvarium is intact. No focal lytic or blastic lesions are present. No significant extracranial soft tissue lesion is present. Sinuses/Orbits: The paranasal sinuses and mastoid air cells are clear. Bilateral lens replacements are noted. Globes and orbits are otherwise unremarkable. IMPRESSION: Negative CT of the head for age. Electronically Signed  By: San Morelle M.D.   On: 02/13/2022 15:69    86 year old male patient with multiple medical problems including CAD/CHF chronic kidney disease stage IV is currently admitted to hospital for right lower extremity DVT; incidentally noted to have platelets of 35 on admission.   # Severe thrombocytopenia-platelets 35-40s-etiology is unclear however clinically suspicious of ITP.  Currently on dexamethasone 20 mg once a day day #2 today.   Platelets today 40   A] Given the lack of significant response to increase the dexamethasone 40 mg a day.  Given extra dose of dexamethasone to  20 mg today.   B] s/p IVIG infusion 400 mg per metered square per day [x3 to 5 days depending upon response]-STARTED on 8/25. Currently on dose #2.  No evidence of bleeding noted.   # Acute DVT of the LEFT lower extremity-continue IV heparin without bolus-given acute thrombocytopenia.  See above   # Orthopnea/short of breath on exertion- VQ scan= negative for PE.  Suspect cardiac/pulmonary cause.  As per hospitalist service.   #CKD-stage IV stable   Discussed with pharmacist.  Discussed with Dr. Sheppard Coil.  I offered to speak to patient's son ; patient politely declined.  All questions were answered.   [name], MD 02/14/2022  3:03 PM

## 2022-02-14 NOTE — Consult Note (Signed)
ANTICOAGULATION CONSULT NOTE  Pharmacy Consult for IV Heparin Indication: DVT   Patient Measurements: Height: '5\' 9"'$  (175.3 cm) Weight: 78 kg (171 lb 15.3 oz) IBW/kg (Calculated) : 70.7 Heparin Dosing Weight: 83.5 kg  Labs: Recent Labs    02/11/22 1255 02/11/22 1255 02/11/22 1947 02/12/22 0450 02/12/22 1111 02/12/22 1414 02/12/22 2255 02/13/22 0535 02/13/22 1514 02/13/22 2256 02/14/22 0425  HGB 15.2  --  15.1 14.1   < > 13.9  --  14.4  --   --  12.7*  HCT 45.8  --   --  42.6   < > 41.4  --  42.8  --   --  37.8*  PLT 53*  --   --  35*   < > 43*  --  41*  --   --  40*  APTT  --   --  31  --   --   --   --   --   --   --   --   LABPROT  --   --  15.1 14.9  --   --   --   --   --   --   --   INR  --   --  1.2 1.2  --   --   --   --   --   --   --   HEPARINUNFRC  --    < >  --  0.58  --  0.74*   < > 0.67 0.40 0.49 0.69  CREATININE 2.46*  --   --  2.40*  --   --   --  1.96*  --   --  1.79*  TROPONINIHS 14  --   --   --   --   --   --   --   --   --   --    < > = values in this interval not displayed.     Estimated Creatinine Clearance: 31.3 mL/min (A) (by C-G formula based on SCr of 1.79 mg/dL (H)).   Medical History: Past Medical History:  Diagnosis Date   Arthritis    Cancer ([place])    COPD (chronic obstructive pulmonary disease) (HCC)    Hoarseness of voice    Hypertension    MRSA (methicillin resistant staph aureus) culture positive    Sick sinus syndrome (HCC)    Sleep apnea    Tremor     Medications:  Aspirin 81 PTA  Assessment: Patient is a 86 y/o M with medical history as above who presented to the ED 8/23 from PCP office with SOB x 2 weeks. Reported recent diastolic CHF diagnosis and diuretic initiation. Endorsing left foot swelling. Patient subsequently found to have LLE DVT. Pt was found to have thrombocytopenia (PLT 53k) on arrival to ED, per note from hematology low suspicion for HIT. Per hematology recs, ok to continue heparin drip. Started on  dexamethasone 20 mg daily for thrombocytopenia. Pt was started on heparin drip without bolus due to thrombocytopenia. Patient was not on anticoagulation prior to admission but does take aspirin 81 mg daily. V/Q scan was normal, no evidence of PE. Pharmacy consulted to initiate and manage heparin infusion for VTE treatment.  Hgb and HCT stable, PLT 54>>35>>43  Goal of Therapy:  Heparin level 0.3 - 0.5 units/ml (no bolus dosing given thrombocytopenia) Monitor platelets by anticoagulation protocol: Yes  8/24 0450 HL 0.58 8/24 1414 HL 0.74, supratherapeutic 8/24 2255 HL 0.62 8/25 0535 HL 0.67 8/25  1514 HL 0.40 therapeutic x1 8/25 2256 HL 0.49, therapeutic x 2 8/26 0425 HL 0.69, supratherapeutic  Per protocol, decrease by 2-3 units/kg/hr due to lower goal of 0.3-0.5   Plan:  --Decrease heparin infusion to 700 units/hr --Recheck HL in 8 hrs after rate change --Daily CBC while on heparin --Monitor PLT due to thrombocytopenia

## 2022-02-14 NOTE — Consult Note (Signed)
ANTICOAGULATION CONSULT NOTE  Pharmacy Consult for IV Heparin Indication: DVT   Patient Measurements: Height: '5\' 9"'$  (175.3 cm) Weight: 75.1 kg (165 lb 9.1 oz) IBW/kg (Calculated) : 70.7 Heparin Dosing Weight: 83.5 kg  Labs: Recent Labs    02/11/22 1255 02/11/22 1255 02/11/22 1947 02/12/22 0450 02/12/22 1111 02/12/22 1414 02/12/22 2255 02/13/22 0535 02/13/22 1514 02/13/22 2256  HGB 15.2  --  15.1 14.1 14.6 13.9  --  14.4  --   --   HCT 45.8  --   --  42.6 42.8 41.4  --  42.8  --   --   PLT 53*  --   --  35* 45* 43*  --  41*  --   --   APTT  --   --  31  --   --   --   --   --   --   --   LABPROT  --   --  15.1 14.9  --   --   --   --   --   --   INR  --   --  1.2 1.2  --   --   --   --   --   --   HEPARINUNFRC  --    < >  --  0.58  --  0.74*   < > 0.67 0.40 0.49  CREATININE 2.46*  --   --  2.40*  --   --   --  1.96*  --   --   TROPONINIHS 14  --   --   --   --   --   --   --   --   --    < > = values in this interval not displayed.     Estimated Creatinine Clearance: 28.6 mL/min (A) (by C-G formula based on SCr of 1.96 mg/dL (H)).   Medical History: Past Medical History:  Diagnosis Date   Arthritis    Cancer ([place])    COPD (chronic obstructive pulmonary disease) (HCC)    Hoarseness of voice    Hypertension    MRSA (methicillin resistant staph aureus) culture positive    Sick sinus syndrome (HCC)    Sleep apnea    Tremor     Medications:  Aspirin 81 PTA  Assessment: Patient is a 86 y/o M with medical history as above who presented to the ED 8/23 from PCP office with SOB x 2 weeks. Reported recent diastolic CHF diagnosis and diuretic initiation. Endorsing left foot swelling. Patient subsequently found to have LLE DVT. Pt was found to have thrombocytopenia (PLT 53k) on arrival to ED, per note from hematology low suspicion for HIT. Per hematology recs, ok to continue heparin drip. Started on dexamethasone 20 mg daily for thrombocytopenia. Pt was started on heparin  drip without bolus due to thrombocytopenia. Patient was not on anticoagulation prior to admission but does take aspirin 81 mg daily. V/Q scan was normal, no evidence of PE. Pharmacy consulted to initiate and manage heparin infusion for VTE treatment.  Hgb and HCT stable, PLT 54>>35>>43  Goal of Therapy:  Heparin level 0.3 - 0.5 units/ml (no bolus dosing given thrombocytopenia) Monitor platelets by anticoagulation protocol: Yes  8/24 0450 HL 0.58 8/24 1414 HL 0.74, supratherapeutic 8/24 2255 HL 0.62 8/25 0535 HL 0.67 8/25 1514 HL 0.40 therapeutic x1 8/25 2256 HL 0.49, therapeutic x 2  Per protocol, decrease by 2-3 units/kg/hr due to lower goal of 0.3-0.5   Plan:  --  HL therapeutic x2 --Recheck HL daily w/ AM labs while therapeutic --Daily CBC while on heparin --Monitor PLT due to thrombocytopenia

## 2022-02-14 NOTE — Progress Notes (Signed)
PROGRESS NOTE    Darren Gonzales   RUE:[id number] DOB: 27-Jul-1935  DOA: 02/11/2022 Date of Service: 02/14/22 PCP: Rusty Aus, MD     Brief Narrative / Hospital Course:  Darren Gonzales is a 86 y.o. male with medical history significant of COPD, HTN, SSS s/p pacemaker,  OSA intolerant to CPAP, HFpEF (EF 50-55%), CKDIIIb, Gout, who presents to ED 02/11/22 from PCP office with complaints of 2 weeks of persistent sob with associated lower extremity swelling. He has also noted associated orthopnea, fatigue, as well chest pain unable to describe but notes it is intermittent. Assoc w/ chills, as well as coughing nonproductive.   He also was concerned about side-effect of gabapentin and was told to hold this medication by PCP as well as hold his amlodipine. He was also recently started on lasix 20 mg 3 days PTA. 08/23: (+)DVT LLE, low Plt, AKI w/ Cr 2.46 on CKD3b baseline Cr 1.9. BNP 414. A1C 6.0. Admitted to hospitalist service for DVT. Started heparin gtt w/o bolus, await VQ d/t AKI.  08/24: Cr 2.40. FeNa c/w pre-renal cause, UA no UTI, (+)ketones c/w dehydration. Hematology recs: see lab orders. Pt does not appear to be in heart failure exacerbation at this time based on exam, labs, CXR. VQ study normal. Continuing heparin drip.  08/25: Cr 1.96. HemOnc dx ITP, administering IVIG today. BNP trending down and no CHF exacerbation based on exam, still needing O2, will trial wean off this. C/o blurred vision per RN yesterday evening, CT head negative, cannot perform MRI d/t pacemaker 08/26: reports blurred vision resolved, he gets these episodes of feeling grainy sensation in eye and then redness. Cancelled MRI. Still low Plt, continuing IVIG and heparin per Hematology, also increasing prednisone.    Consultants: Vascular surgery (Dr Lucky Cowboy) for DVT possible PE HemOnc (Dr Rogue Bussing) for thrombocytopenia   Procedures:  None    Subjective: Patient reports feeling like breathing normally  today, blurred vision has resolved. No other complaints.      ASSESSMENT & PLAN:   Principal Problem:   DVT (deep venous thrombosis) (HCC) Active Problems:   Aortic atherosclerosis (HCC)   Benign essential tremor   Chronic diastolic CHF (congestive heart failure), NYHA class 3 (HCC)   Stage 3b chronic kidney disease (CKD) (HCC)   Essential hypertension   S/P placement of cardiac pacemaker   Sinoatrial node dysfunction (HCC)   Acute kidney injury superimposed on chronic kidney disease stage 3b (HCC)   Gout   Acute ITP (HCC)   Malnutrition of moderate degree   Shortness of breath   DVT (deep venous thrombosis) (HCC) Heparin drip w/o bolus d/t low Plt  VQ no concern for PE Vascular surgery and hematology following Hematology advises remain on heparin for now    Acute kidney injury superimposed on chronic kidney disease stage 3b ([place]) Prerenal based on FeNa, likely dehydration UA no UTI/casts IMPROVING  Holding nephrotoxic meds Caution w/ fluid resuscitation d/t hx HFpEF and slightly elevated BNP here though this more likely due to poor renal clearance/age. Pt does not appear to be in heart failure exacerbation at this time based on exam, CXR. BNP actually trending down  consider renal US / nephro consult if no improvement   Thrombocytopenia (HCC) HemOnc following - workup pending but suspect ITP -->  dexamethasone 20 mg p.o. daily x 4 days or so --> increased on 08/26 hold platelet transfusion unless platelets keep trending down to 30 or if bleeding IVIG infusion  Continue heparin for  now Close monitor CBC   Shortness of breath Tachypnea w/o tachcyardia but he has SSS / pacemaker so may not have tachycardia  No drop in SpO2 but pt has been more comfortable on 2L/min [place] CXR no concerns for infection or fluid overload Continue O2 --> wean as tolerated --> has been on RA today   Chronic diastolic CHF (congestive heart failure), NYHA class 3 (HCC) Etiology HTN / OSA   HFpEF 50-55% Slight elevation BNP in ED but appears fluid compensated, CXR neg home meds (based on last cardiology note 11/2021: isosorbide ER 30 mg, propranolol ER 240 mg qhs, pravastatin 20 mg - pt reports not taking statin) Cont isosorbide added rosuvastatin lowered dose propranolol d/t borderline BP AKI precludes ACE/ARB or diuretics for now  Pt does not appear to be in heart failure exacerbation at this time   Essential hypertension home meds based on last cardiology note 11/2021: isosorbide ER 30 mg, propranolol ER 240 mg qhs, pravastatin 20 mg  Benign essential tremor On propranolol and primidone at home Primidone held d/t AKI Cont propranolol but caution w/ low BP Gout Holding colchicine d/t AKI  S/P placement of cardiac pacemaker D/t SSS Telemetry for now      DVT prophylaxis: heparin Code Status: FULL Family Communication: pt decliend call to family today  Disposition Plan / TOC needs: home when stabilized Barriers to discharge / significant pending items: vascular recs, heparin drip, IVIG ordered, still on O2, low Plt              Objective: Vitals:   02/14/22 0416 02/14/22 0757 02/14/22 1050 02/14/22 1110  BP:  (!) 150/88  (!) 152/84  Pulse:  67 68 64  Resp:  19  17  Temp:  97.9 F (36.6 C)  (!) 96.5 F (35.8 C)  TempSrc:      SpO2:  100% 91% 97%  Weight: 78 kg     Height:        Intake/Output Summary (Last 24 hours) at 02/14/2022 1436 Last data filed at 02/14/2022 0954 Gross per 24 hour  Intake 714 ml  Output 650 ml  Net 64 ml    Filed Weights   02/11/22 1254 02/11/22 2148 02/14/22 0416  Weight: 83.5 kg 75.1 kg 78 kg    Examination:  Constitutional:  VS as above General Appearance: alert, well-developed, well-nourished, NAD Eyes: Normal lids and conjunctive, non-icteric sclera Ears, Nose, Mouth, Throat: Normal external appearance MMM Neck: No masses, trachea midline, no JVD or carotid bruit Respiratory: Normal respiratory  effort No wheeze No rhonchi No rales Cardiovascular: S1/S2 normal, RRR No murmur No JVD No lower extremity edema Gastrointestinal: No tenderness No masses Musculoskeletal:  No clubbing/cyanosis of digits Symmetrical movement in all extremities Neurological: No cranial nerve deficit on limited exam Alert Psychiatric: Normal judgment/insight Normal mood and affect       Scheduled Medications:   aspirin EC  81 mg Oral Daily   dexamethasone  20 mg Oral Daily   feeding supplement  237 mL Oral TID BM   isosorbide mononitrate  30 mg Oral Daily   multivitamin with minerals  1 tablet Oral Daily   pantoprazole  40 mg Oral Daily   propranolol ER  180 mg Oral Daily   rosuvastatin  10 mg Oral QHS    Continuous Infusions:  heparin 700 Units/hr (02/14/22 0636)   IMMUNE GLOBULIN 10% (HUMAN) IV - For Fluid Restriction Only 170 mL/hr at 02/13/22 1620    PRN Medications:  acetaminophen **OR**  acetaminophen, albuterol, ondansetron **OR** ondansetron (ZOFRAN) IV  Antimicrobials:  Anti-infectives (From admission, onward)    None       Data Reviewed: I have personally reviewed following labs and imaging studies  CBC: Recent Labs  Lab 02/12/22 0450 02/12/22 1111 02/12/22 1414 02/13/22 0535 02/14/22 0425  WBC 7.6 7.7 6.6 6.8 7.9  NEUTROABS  --  4.6 5.3  --   --   HGB 14.1 14.6 13.9 14.4 12.7*  HCT 42.6 42.8 41.4 42.8 37.8*  MCV 95.1 93.4 94.1 94.1 92.4  PLT 35* 45* 43* 41* 40*    Basic Metabolic Panel: Recent Labs  Lab 02/11/22 1255 02/12/22 0450 02/13/22 0535 02/14/22 0425  NA 142 143 142 137  K 4.2 4.1 4.4 4.3  CL 108 114* 109 112*  CO2 '25 24 26 22  '$ GLUCOSE 116* 100* 160* 153*  BUN 47* 49* 56* 57*  CREATININE 2.46* 2.40* 1.96* 1.79*  CALCIUM 8.8* 8.2* 9.9 9.4    GFR: Estimated Creatinine Clearance: 31.3 mL/min (A) (by C-G formula based on SCr of 1.79 mg/dL (H)). Liver Function Tests: Recent Labs  Lab 02/11/22 1255 02/12/22 0450  AST 30 24  ALT  22 20  ALKPHOS 70 61  BILITOT 1.6* 1.3*  PROT 6.2* 5.5*  ALBUMIN 3.2* 2.8*    No results for input(s): "LIPASE", "AMYLASE" in the last 168 hours. No results for input(s): "AMMONIA" in the last 168 hours. Coagulation Profile: Recent Labs  Lab 02/11/22 1947 02/12/22 0450  INR 1.2 1.2    Cardiac Enzymes: No results for input(s): "CKTOTAL", "CKMB", "CKMBINDEX", "TROPONINI" in the last 168 hours. BNP (last 3 results) No results for input(s): "PROBNP" in the last 8760 hours. HbA1C: Recent Labs    02/11/22 1947  HGBA1C 6.0*    CBG: Recent Labs  Lab 02/14/22 0756 02/14/22 1111  GLUCAP 129* 136*   Lipid Profile: No results for input(s): "CHOL", "HDL", "LDLCALC", "TRIG", "CHOLHDL", "LDLDIRECT" in the last 72 hours. Thyroid Function Tests: No results for input(s): "TSH", "T4TOTAL", "FREET4", "T3FREE", "THYROIDAB" in the last 72 hours. Anemia Panel: No results for input(s): "VITAMINB12", "FOLATE", "FERRITIN", "TIBC", "IRON", "RETICCTPCT" in the last 72 hours. Urine analysis:    Component Value Date/Time   COLORURINE YELLOW (A) 02/12/2022 0940   APPEARANCEUR CLEAR (A) 02/12/2022 0940   LABSPEC 1.020 02/12/2022 0940   PHURINE 5.0 02/12/2022 0940   GLUCOSEU NEGATIVE 02/12/2022 0940   HGBUR NEGATIVE 02/12/2022 0940   BILIRUBINUR NEGATIVE 02/12/2022 0940   KETONESUR 5 (A) 02/12/2022 0940   PROTEINUR NEGATIVE 02/12/2022 0940   NITRITE NEGATIVE 02/12/2022 0940   LEUKOCYTESUR NEGATIVE 02/12/2022 0940   Sepsis Labs: '@LABRCNTIP'$ (procalcitonin:4,lacticidven:4)  No results found for this or any previous visit (from the past 240 hour(s)).       Radiology Studies: US Venous Img Lower Unilateral Left  Result Date: 02/11/2022 CLINICAL DATA:  Swelling and pain EXAM: Left LOWER EXTREMITY VENOUS DOPPLER ULTRASOUND TECHNIQUE: Gray-scale sonography with graded compression, as well as color Doppler and duplex ultrasound were performed to evaluate the lower extremity deep venous  systems from the level of the common femoral vein and including the common femoral, femoral, profunda femoral, popliteal and calf veins including the posterior tibial, peroneal and gastrocnemius veins when visible. The superficial great saphenous vein was also interrogated. Spectral Doppler was utilized to evaluate flow at rest and with distal augmentation maneuvers in the common femoral, femoral and popliteal veins. COMPARISON:  None Available. FINDINGS: Contralateral Common Femoral Vein: Respiratory phasicity is normal and symmetric with  the symptomatic side. No evidence of thrombus. Normal compressibility. Common Femoral Vein: No evidence of thrombus. Normal compressibility, respiratory phasicity and response to augmentation. Saphenofemoral Junction: No evidence of thrombus. Normal compressibility and flow on color Doppler imaging. Profunda Femoral Vein: No evidence of thrombus. Normal compressibility and flow on color Doppler imaging. Femoral Vein: No evidence of thrombus. Normal compressibility, respiratory phasicity and response to augmentation. Popliteal Vein: Positive for occlusive thrombus.  Noncompressible. Calf Veins: Positive for occlusive thrombus within the posterior tibial and peroneal veins. Noncompressible. IMPRESSION: Positive for acute occlusive DVT involving the calf veins and popliteal vein. Critical Value/emergent results were called by telephone at the time of interpretation on 02/11/2022 at 5:48 pm to provider [facility] , who verbally acknowledged these results. Electronically Signed   By: Donavan Foil M.D.   On: 02/11/2022 17:48   DG Chest 2 View  Result Date: 02/11/2022 CLINICAL DATA:  Shortness of breath x2 weeks EXAM: CHEST - 2 VIEW COMPARISON:  10/24/2019 FINDINGS: Cardiac size is within normal limits. There are no signs of pulmonary edema or focal pulmonary consolidation. There is no pleural effusion or pneumothorax. There is minimal blunting of right lateral CP angle.  Pacemaker battery is seen in the left infraclavicular region. There is no pneumothorax. IMPRESSION: There are no signs of pulmonary edema or focal pulmonary consolidation. Blunting of right lateral CP angle may suggest minimal effusion or pleural thickening. Electronically Signed   By: Elmer Picker M.D.   On: 02/11/2022 13:42            LOS: 3 days   Emeterio Reeve, DO [facility] 02/14/2022, 2:36 PM   Staff may message me via secure chat in [place]  but this may not receive immediate response,  please page for urgent matters!  If 7PM-7AM, please contact night-coverage [email or web address]  Dictation software was used to generate the above note. Typos may occur and escape review, as with typed/written notes. Please contact Dr Sheppard Coil directly for clarity if needed.

## 2022-02-14 NOTE — Progress Notes (Signed)
Patient's Heparin has been changed from 8.5 ml/hr to 7 ml/hr. Rate dose change confirmed with Ernestina Penna, Charge RN.

## 2022-02-14 NOTE — Consult Note (Signed)
ANTICOAGULATION CONSULT NOTE  Pharmacy Consult for IV Heparin Indication: DVT   Patient Measurements: Height: '5\' 9"'$  (175.3 cm) Weight: 78 kg (171 lb 15.3 oz) IBW/kg (Calculated) : 70.7 Heparin Dosing Weight: 83.5 kg  Labs: Recent Labs    02/11/22 1947 02/11/22 1947 02/12/22 0450 02/12/22 1111 02/12/22 1414 02/12/22 2255 02/13/22 0535 02/13/22 1514 02/13/22 2256 02/14/22 0425 02/14/22 1411  HGB 15.1  --  14.1   < > 13.9  --  14.4  --   --  12.7*  --   HCT  --   --  42.6   < > 41.4  --  42.8  --   --  37.8*  --   PLT  --   --  35*   < > 43*  --  41*  --   --  40*  --   APTT 31  --   --   --   --   --   --   --   --   --   --   LABPROT 15.1  --  14.9  --   --   --   --   --   --   --   --   INR 1.2  --  1.2  --   --   --   --   --   --   --   --   HEPARINUNFRC  --    < > 0.58  --  0.74*   < > 0.67   < > 0.49 0.69 0.68  CREATININE  --   --  2.40*  --   --   --  1.96*  --   --  1.79*  --    < > = values in this interval not displayed.    Estimated Creatinine Clearance: 31.3 mL/min (A) (by C-G formula based on SCr of 1.79 mg/dL (H)).  Medical History: Past Medical History:  Diagnosis Date   Arthritis    Cancer ([place])    COPD (chronic obstructive pulmonary disease) (HCC)    Hoarseness of voice    Hypertension    MRSA (methicillin resistant staph aureus) culture positive    Sick sinus syndrome (HCC)    Sleep apnea    Tremor     Medications:  Aspirin 81 PTA  Assessment: Patient is a 86 y/o M with medical history as above who presented to the ED 8/23 from PCP office with SOB x 2 weeks. Reported recent diastolic CHF diagnosis and diuretic initiation. Endorsing left foot swelling. Patient subsequently found to have LLE DVT. Pt was found to have thrombocytopenia (PLT 53k) on arrival to ED, per note from hematology low suspicion for HIT. Per hematology recs, ok to continue heparin drip. Pt was started on heparin drip without bolus due to thrombocytopenia. Started on  dexamethasone 20 mg daily and IVIG for thrombocytopenia. Patient was not on anticoagulation prior to admission but does take aspirin 81 mg daily. V/Q scan was normal, no evidence of PE. Pharmacy consulted to initiate and manage heparin infusion for VTE treatment.  Hgb and HCT stable, PLT 54>>35>>43>>40  Goal of Therapy:  Heparin level 0.3 - 0.5 units/ml (no bolus dosing given thrombocytopenia) Monitor platelets by anticoagulation protocol: Yes  8/24 0450 HL 0.58 8/24 1414 HL 0.74, supratherapeutic 8/24 2255 HL 0.62 8/25 0535 HL 0.67 8/25 1514 HL 0.40 therapeutic x1 8/25 2256 HL 0.49, therapeutic x 2 8/26 0425 HL 0.69, supratherapeutic 8/26 0425 HL 0.68, supratherapeutic  Per  protocol, decrease by 2 units/kg/hr due to lower goal of 0.3-0.5. However, because last decrease by 2 units/kg/hr did not result in a significant decrease (HL went from 0.69 >> 0.68), opting to decrease by 3 units/kg/hr this time.   Plan:  --Decrease heparin infusion to 450 units/hr --Recheck HL in 8 hrs after rate change --Daily CBC while on heparin --Monitor PLT due to thrombocytopenia  Delena Bali, PharmD Pharmacy Resident  02/14/2022 3:10 PM

## 2022-02-15 DIAGNOSIS — I700 Atherosclerosis of aorta: Secondary | ICD-10-CM | POA: Diagnosis not present

## 2022-02-15 DIAGNOSIS — D693 Immune thrombocytopenic purpura: Secondary | ICD-10-CM | POA: Diagnosis not present

## 2022-02-15 DIAGNOSIS — I824Z9 Acute embolism and thrombosis of unspecified deep veins of unspecified distal lower extremity: Secondary | ICD-10-CM | POA: Diagnosis not present

## 2022-02-15 DIAGNOSIS — N179 Acute kidney failure, unspecified: Secondary | ICD-10-CM | POA: Diagnosis not present

## 2022-02-15 LAB — BASIC METABOLIC PANEL
Anion gap: 5 (ref 5–15)
BUN: 59 mg/dL — ABNORMAL HIGH (ref 8–23)
CO2: 23 mmol/L (ref 22–32)
Calcium: 9.3 mg/dL (ref 8.9–10.3)
Chloride: 110 mmol/L (ref 98–111)
Creatinine, Ser: 1.62 mg/dL — ABNORMAL HIGH (ref 0.61–1.24)
GFR, Estimated: 42 mL/min — ABNORMAL LOW (ref >60)
Glucose, Bld: 168 mg/dL — ABNORMAL HIGH (ref 70–99)
Potassium: 4.2 mmol/L (ref 3.5–5.1)
Sodium: 138 mmol/L (ref 135–145)

## 2022-02-15 LAB — CBC
HCT: 39.1 % (ref 39.0–52.0)
HCT: 36.5 % — ABNORMAL LOW (ref 39.0–52.0)
HCT: 35.5 % — ABNORMAL LOW (ref 39.0–52.0)
Hemoglobin: 13.5 g/dL (ref 13.0–17.0)
Hemoglobin: 12.7 g/dL — ABNORMAL LOW (ref 13.0–17.0)
Hemoglobin: 12.2 g/dL — ABNORMAL LOW (ref 13.0–17.0)
MCH: 31.5 pg (ref 26.0–34.0)
MCH: 32.2 pg (ref 26.0–34.0)
MCH: 31.2 pg (ref 26.0–34.0)
MCHC: 34.5 g/dL (ref 30.0–36.0)
MCHC: 34.8 g/dL (ref 30.0–36.0)
MCHC: 34.4 g/dL (ref 30.0–36.0)
MCV: 91.4 fL (ref 80.0–100.0)
MCV: 92.4 fL (ref 80.0–100.0)
MCV: 90.8 fL (ref 80.0–100.0)
Platelets: 58 10*3/uL — ABNORMAL LOW (ref 150–400)
Platelets: 40 10*3/uL — ABNORMAL LOW (ref 150–400)
Platelets: 43 10*3/uL — ABNORMAL LOW (ref 150–400)
RBC: 4.28 MIL/uL (ref 4.22–5.81)
RBC: 3.95 MIL/uL — ABNORMAL LOW (ref 4.22–5.81)
RBC: 3.91 MIL/uL — ABNORMAL LOW (ref 4.22–5.81)
RDW: 12.5 % (ref 11.5–15.5)
RDW: 12.1 % (ref 11.5–15.5)
RDW: 12.3 % (ref 11.5–15.5)
WBC: 11 10*3/uL — ABNORMAL HIGH (ref 4.0–10.5)
WBC: 8.7 10*3/uL (ref 4.0–10.5)
WBC: 9.9 10*3/uL (ref 4.0–10.5)
nRBC: 0.7 % — ABNORMAL HIGH (ref 0.0–0.2)
nRBC: 0.8 % — ABNORMAL HIGH (ref 0.0–0.2)
nRBC: 1.2 % — ABNORMAL HIGH (ref 0.0–0.2)

## 2022-02-15 LAB — GLUCOSE, CAPILLARY
Glucose-Capillary: 152 mg/dL — ABNORMAL HIGH (ref 70–99)
Glucose-Capillary: 138 mg/dL — ABNORMAL HIGH (ref 70–99)
Glucose-Capillary: 203 mg/dL — ABNORMAL HIGH (ref 70–99)

## 2022-02-15 LAB — HEPARIN LEVEL (UNFRACTIONATED)
Heparin Unfractionated: 0.46 IU/mL (ref 0.30–0.70)
Heparin Unfractionated: 0.4 IU/mL (ref 0.30–0.70)

## 2022-02-15 LAB — LACTATE DEHYDROGENASE: LDH: 528 U/L — ABNORMAL HIGH (ref 98–192)

## 2022-02-15 MED ORDER — DEXTROSE 5 % IV SOLN: INTRAVENOUS | Status: DC

## 2022-02-15 NOTE — Progress Notes (Signed)
PROGRESS NOTE    Darren Gonzales   OMV:[id number] DOB: 27-Jun-1935  DOA: 02/11/2022 Date of Service: 02/15/22 PCP: Rusty Aus, MD     Brief Narrative / Hospital Course:  Darren Gonzales is a 86 y.o. male with medical history significant of COPD, HTN, SSS s/p pacemaker,  OSA intolerant to CPAP, HFpEF (EF 50-55%), CKDIIIb, Gout, who presents to ED 02/11/22 from PCP office with complaints of 2 weeks of persistent sob with associated lower extremity swelling. He has also noted associated orthopnea, fatigue, as well chest pain unable to describe but notes it is intermittent. Assoc w/ chills, as well as coughing nonproductive.   He also was concerned about side-effect of gabapentin and was told to hold this medication by PCP as well as hold his amlodipine. He was also recently started on lasix 20 mg 3 days PTA. 08/23: (+)DVT LLE, low Plt, AKI w/ Cr 2.46 on CKD3b baseline Cr 1.9. BNP 414. A1C 6.0. Admitted to hospitalist service for DVT. Started heparin gtt w/o bolus, await VQ d/t AKI.  08/24: Cr 2.40. FeNa c/w pre-renal cause, UA no UTI, (+)ketones c/w dehydration. Hematology recs: see lab orders. Pt does not appear to be in heart failure exacerbation at this time based on exam, labs, CXR. VQ study normal. Continuing heparin drip.  08/25: Cr 1.96. HemOnc dx ITP, administering IVIG today. BNP trending down and no CHF exacerbation based on exam, still needing O2, will trial wean off this. C/o blurred vision per RN yesterday evening, CT head negative, cannot perform MRI d/t pacemaker 08/26: reports blurred vision resolved, he gets these episodes of feeling grainy sensation in eye and then redness. Cancelled MRI. Still low Plt, continuing IVIG and heparin per Hematology, also increasing prednisone. Plt 58. On room air now.  08/27: Plt back to 40. Cr improving 1.62.    Consultants: Vascular surgery (Dr Lucky Cowboy) for DVT possible PE HemOnc (Dr Rogue Bussing) for thrombocytopenia   Procedures:   None    Subjective: Patient reports feeling like breathing normally today, blurred vision remains resolved. No other complaints. In good spirits, sone at bedside     ASSESSMENT & PLAN:   Principal Problem:   DVT (deep venous thrombosis) (HCC) Active Problems:   Aortic atherosclerosis (HCC)   Benign essential tremor   Chronic diastolic CHF (congestive heart failure), NYHA class 3 (HCC)   Stage 3b chronic kidney disease (CKD) (HCC)   Essential hypertension   S/P placement of cardiac pacemaker   Sinoatrial node dysfunction (HCC)   Acute kidney injury superimposed on chronic kidney disease stage 3b (HCC)   Gout   Acute ITP (HCC)   Malnutrition of moderate degree   Shortness of breath   DVT (deep venous thrombosis) (HCC) Heparin drip  VQ no concern for PE Vascular surgery and hematology following Hematology advised remain on heparin for now    Acute kidney injury superimposed on chronic kidney disease stage 3b ([place]) Prerenal based on FeNa, likely dehydration UA no UTI/casts IMPROVING  Holding nephrotoxic meds Caution w/ fluid resuscitation d/t hx HFpEF and slightly elevated BNP here though this more likely due to poor renal clearance/age. Pt does not appear to be in heart failure exacerbation at this time based on exam, CXR. BNP actually trending down  consider renal US / nephro consult if no improvement but is trending appropriately  Thrombocytopenia (HCC) HemOnc following - full workup pending but c/w ITP -->  dexamethasone 20 mg p.o. daily x 4 days or so --> increased on 08/26  hold platelet transfusion unless platelets keep trending down to 30 or if bleeding IVIG infusion  Continue heparin for now Close monitor CBC   Shortness of breath Tachypnea w/o tachcyardia but he has SSS / pacemaker so may not have tachycardia  No drop in SpO2 but pt has been more comfortable on 2L/min [place] CXR no concerns for infection or fluid overload Continue O2 --> wean as tolerated -->  has been on RA today   Chronic diastolic CHF (congestive heart failure), NYHA class 3 (HCC) Etiology HTN / OSA  HFpEF 50-55% Slight elevation BNP in ED but appears fluid compensated, CXR neg home meds (based on last cardiology note 11/2021: isosorbide ER 30 mg, propranolol ER 240 mg qhs, pravastatin 20 mg - pt reports not taking statin) Cont isosorbide added rosuvastatin lowered dose propranolol d/t borderline BP AKI precludes ACE/ARB or diuretics for now  Pt does not appear to be in heart failure exacerbation at this time   Essential hypertension home meds based on last cardiology note 11/2021: isosorbide ER 30 mg, propranolol ER 240 mg qhs, pravastatin 20 mg  Benign essential tremor On propranolol and primidone at home Primidone held d/t AKI Cont propranolol but caution w/ low BP Gout Holding colchicine d/t AKI  S/P placement of cardiac pacemaker D/t SSS Telemetry for now      DVT prophylaxis: heparin Code Status: FULL Family Communication: son at bedside on rounds  Disposition Plan / TOC needs: home when stabilized Barriers to discharge / significant pending items: vascular recs, heparin drip, IVIG ordered, still on O2, low Plt              Objective: Vitals:   02/15/22 1105 02/15/22 1144 02/15/22 1242 02/15/22 1603  BP: (!) 153/89 (!) 166/107 (!) 144/72 122/83  Pulse: (!) 59 61 62 60  Resp: '18 19 18 19  '$ Temp:  (!) 97.5 F (36.4 C)  97.6 F (36.4 C)  TempSrc:  Oral  Oral  SpO2: 95% 99% 100% 94%  Weight:      Height:        Intake/Output Summary (Last 24 hours) at 02/15/2022 1728 Last data filed at 02/15/2022 1605 Gross per 24 hour  Intake 587.11 ml  Output 550 ml  Net 37.11 ml    Filed Weights   02/11/22 1254 02/11/22 2148 02/14/22 0416  Weight: 83.5 kg 75.1 kg 78 kg    Examination:  Constitutional:  VS as above General Appearance: alert, well-developed, well-nourished, NAD Eyes: Normal lids and conjunctive, non-icteric sclera Ears,  Nose, Mouth, Throat: Normal external appearance MMM Neck: No masses, trachea midline, no JVD or carotid bruit Respiratory: Normal respiratory effort No wheeze No rhonchi No rales Cardiovascular: S1/S2 normal, RRR No murmur No JVD No lower extremity edema Gastrointestinal: No tenderness No masses Musculoskeletal:  No clubbing/cyanosis of digits Symmetrical movement in all extremities Neurological: No cranial nerve deficit on limited exam Alert Psychiatric: Normal judgment/insight Normal mood and affect       Scheduled Medications:   aspirin EC  81 mg Oral Daily   dexamethasone  40 mg Oral Daily   feeding supplement  237 mL Oral TID BM   isosorbide mononitrate  30 mg Oral Daily   multivitamin with minerals  1 tablet Oral Daily   pantoprazole  40 mg Oral Daily   propranolol ER  180 mg Oral Daily   rosuvastatin  10 mg Oral QHS    Continuous Infusions:  dextrose Stopped (02/15/22 1000)   heparin 450 Units/hr (02/14/22 1545)  IMMUNE GLOBULIN 10% (HUMAN) IV - For Fluid Restriction Only 187 mL/hr at 02/15/22 1103    PRN Medications:  acetaminophen **OR** acetaminophen, albuterol, ondansetron **OR** ondansetron (ZOFRAN) IV  Antimicrobials:  Anti-infectives (From admission, onward)    None       Data Reviewed: I have personally reviewed following labs and imaging studies  CBC: Recent Labs  Lab 02/12/22 1111 02/12/22 1414 02/13/22 0535 02/14/22 0425 02/14/22 2327 02/15/22 0538 02/15/22 1607  WBC 7.7 6.6 6.8 7.9 11.0* 8.7 9.9  NEUTROABS 4.6 5.3  --   --   --   --   --   HGB 14.6 13.9 14.4 12.7* 13.5 12.7* 12.2*  HCT 42.8 41.4 42.8 37.8* 39.1 36.5* 35.5*  MCV 93.4 94.1 94.1 92.4 91.4 92.4 90.8  PLT 45* 43* 41* 40* 58* 40* 43*    Basic Metabolic Panel: Recent Labs  Lab 02/11/22 1255 02/12/22 0450 02/13/22 0535 02/14/22 0425 02/15/22 0538  NA 142 143 142 137 138  K 4.2 4.1 4.4 4.3 4.2  CL 108 114* 109 112* 110  CO2 '25 24 26 22 23  '$ GLUCOSE  116* 100* 160* 153* 168*  BUN 47* 49* 56* 57* 59*  CREATININE 2.46* 2.40* 1.96* 1.79* 1.62*  CALCIUM 8.8* 8.2* 9.9 9.4 9.3    GFR: Estimated Creatinine Clearance: 34.5 mL/min (A) (by C-G formula based on SCr of 1.62 mg/dL (H)). Liver Function Tests: Recent Labs  Lab 02/11/22 1255 02/12/22 0450  AST 30 24  ALT 22 20  ALKPHOS 70 61  BILITOT 1.6* 1.3*  PROT 6.2* 5.5*  ALBUMIN 3.2* 2.8*    No results for input(s): "LIPASE", "AMYLASE" in the last 168 hours. No results for input(s): "AMMONIA" in the last 168 hours. Coagulation Profile: Recent Labs  Lab 02/11/22 1947 02/12/22 0450  INR 1.2 1.2    Cardiac Enzymes: No results for input(s): "CKTOTAL", "CKMB", "CKMBINDEX", "TROPONINI" in the last 168 hours. BNP (last 3 results) No results for input(s): "PROBNP" in the last 8760 hours. HbA1C: No results for input(s): "HGBA1C" in the last 72 hours.  CBG: Recent Labs  Lab 02/14/22 1513 02/14/22 2015 02/15/22 0734 02/15/22 1141 02/15/22 1606  GLUCAP 174* 214* 152* 138* 203*    Lipid Profile: No results for input(s): "CHOL", "HDL", "LDLCALC", "TRIG", "CHOLHDL", "LDLDIRECT" in the last 72 hours. Thyroid Function Tests: No results for input(s): "TSH", "T4TOTAL", "FREET4", "T3FREE", "THYROIDAB" in the last 72 hours. Anemia Panel: No results for input(s): "VITAMINB12", "FOLATE", "FERRITIN", "TIBC", "IRON", "RETICCTPCT" in the last 72 hours. Urine analysis:    Component Value Date/Time   COLORURINE YELLOW (A) 02/12/2022 0940   APPEARANCEUR CLEAR (A) 02/12/2022 0940   LABSPEC 1.020 02/12/2022 0940   PHURINE 5.0 02/12/2022 0940   GLUCOSEU NEGATIVE 02/12/2022 0940   HGBUR NEGATIVE 02/12/2022 0940   BILIRUBINUR NEGATIVE 02/12/2022 0940   KETONESUR 5 (A) 02/12/2022 0940   PROTEINUR NEGATIVE 02/12/2022 0940   NITRITE NEGATIVE 02/12/2022 0940   LEUKOCYTESUR NEGATIVE 02/12/2022 0940   Sepsis Labs: '@LABRCNTIP'$ (procalcitonin:4,lacticidven:4)  No results found for this or any  previous visit (from the past 240 hour(s)).       Radiology Studies: US Venous Img Lower Unilateral Left  Result Date: 02/11/2022 CLINICAL DATA:  Swelling and pain EXAM: Left LOWER EXTREMITY VENOUS DOPPLER ULTRASOUND TECHNIQUE: Gray-scale sonography with graded compression, as well as color Doppler and duplex ultrasound were performed to evaluate the lower extremity deep venous systems from the level of the common femoral vein and including the common femoral, femoral, profunda  femoral, popliteal and calf veins including the posterior tibial, peroneal and gastrocnemius veins when visible. The superficial great saphenous vein was also interrogated. Spectral Doppler was utilized to evaluate flow at rest and with distal augmentation maneuvers in the common femoral, femoral and popliteal veins. COMPARISON:  None Available. FINDINGS: Contralateral Common Femoral Vein: Respiratory phasicity is normal and symmetric with the symptomatic side. No evidence of thrombus. Normal compressibility. Common Femoral Vein: No evidence of thrombus. Normal compressibility, respiratory phasicity and response to augmentation. Saphenofemoral Junction: No evidence of thrombus. Normal compressibility and flow on color Doppler imaging. Profunda Femoral Vein: No evidence of thrombus. Normal compressibility and flow on color Doppler imaging. Femoral Vein: No evidence of thrombus. Normal compressibility, respiratory phasicity and response to augmentation. Popliteal Vein: Positive for occlusive thrombus.  Noncompressible. Calf Veins: Positive for occlusive thrombus within the posterior tibial and peroneal veins. Noncompressible. IMPRESSION: Positive for acute occlusive DVT involving the calf veins and popliteal vein. Critical Value/emergent results were called by telephone at the time of interpretation on 02/11/2022 at 5:48 pm to provider [facility] , who verbally acknowledged these results. Electronically Signed   By: Donavan Foil  M.D.   On: 02/11/2022 17:48   DG Chest 2 View  Result Date: 02/11/2022 CLINICAL DATA:  Shortness of breath x2 weeks EXAM: CHEST - 2 VIEW COMPARISON:  10/24/2019 FINDINGS: Cardiac size is within normal limits. There are no signs of pulmonary edema or focal pulmonary consolidation. There is no pleural effusion or pneumothorax. There is minimal blunting of right lateral CP angle. Pacemaker battery is seen in the left infraclavicular region. There is no pneumothorax. IMPRESSION: There are no signs of pulmonary edema or focal pulmonary consolidation. Blunting of right lateral CP angle may suggest minimal effusion or pleural thickening. Electronically Signed   By: Elmer Picker M.D.   On: 02/11/2022 13:42            LOS: 4 days   Emeterio Reeve, DO [facility] 02/15/2022, 5:28 PM   Staff may message me via secure chat in [place]  but this may not receive immediate response,  please page for urgent matters!  If 7PM-7AM, please contact night-coverage [email or web address]  Dictation software was used to generate the above note. Typos may occur and escape review, as with typed/written notes. Please contact Dr Sheppard Coil directly for clarity if needed.

## 2022-02-15 NOTE — Consult Note (Signed)
ANTICOAGULATION CONSULT NOTE  Pharmacy Consult for IV Heparin Indication: DVT   Patient Measurements: Height: '5\' 9"'$  (175.3 cm) Weight: 78 kg (171 lb 15.3 oz) IBW/kg (Calculated) : 70.7 Heparin Dosing Weight: 83.5 kg  Labs: Recent Labs    02/13/22 0535 02/13/22 1514 02/14/22 0425 02/14/22 1411 02/14/22 2327 02/15/22 0538  HGB 14.4  --  12.7*  --  13.5 12.7*  HCT 42.8  --  37.8*  --  39.1 36.5*  PLT 41*  --  40*  --  58* 40*  HEPARINUNFRC 0.67   < > 0.69 0.68 0.46 0.40  CREATININE 1.96*  --  1.79*  --   --  1.62*   < > = values in this interval not displayed.    Estimated Creatinine Clearance: 34.5 mL/min (A) (by C-G formula based on SCr of 1.62 mg/dL (H)).  Medical History: Past Medical History:  Diagnosis Date   Arthritis    Cancer ([place])    COPD (chronic obstructive pulmonary disease) (HCC)    Hoarseness of voice    Hypertension    MRSA (methicillin resistant staph aureus) culture positive    Sick sinus syndrome (HCC)    Sleep apnea    Tremor     Medications:  Aspirin 81 PTA  Assessment: Patient is a 86 y/o M with medical history as above who presented to the ED 8/23 from PCP office with SOB x 2 weeks. Reported recent diastolic CHF diagnosis and diuretic initiation. Endorsing left foot swelling. Patient subsequently found to have LLE DVT. Pt was found to have thrombocytopenia (PLT 53k) on arrival to ED, per note from hematology low suspicion for HIT. Per hematology recs, ok to continue heparin drip. Pt was started on heparin drip without bolus due to thrombocytopenia. Started on dexamethasone 20 mg daily and IVIG for thrombocytopenia. Patient was not on anticoagulation prior to admission but does take aspirin 81 mg daily. V/Q scan was normal, no evidence of PE. Pharmacy consulted to initiate and manage heparin infusion for VTE treatment.  Hgb and HCT stable, PLT 54>>35>>43>>40  Goal of Therapy:  Heparin level 0.3 - 0.5 units/ml (no bolus dosing given  thrombocytopenia) Monitor platelets by anticoagulation protocol: Yes  8/24 0450 HL 0.58 8/24 1414 HL 0.74, supratherapeutic 8/24 2255 HL 0.62 8/25 0535 HL 0.67 8/25 1514 HL 0.40 therapeutic x1 8/25 2256 HL 0.49, therapeutic x 2 8/26 0425 HL 0.69, supratherapeutic 8/26 0425 HL 0.68, supratherapeutic 8/26 2327 HL 0.46, therapeutic x 1 8/27 0538 HL 0.40, therapeutic x 2  Per protocol, decrease by 2 units/kg/hr due to lower goal of 0.3-0.5. However, because last decrease by 2 units/kg/hr did not result in a significant decrease (HL went from 0.69 >> 0.68), opting to decrease by 3 units/kg/hr this time.   Plan:  --Continue heparin infusion at 450 units/hr --Recheck HL daily w/ w/ AM labs while therapeutic --Daily CBC while on heparin --Monitor PLT due to thrombocytopenia  [name], PharmD, [facility] 02/15/2022 6:31 AM

## 2022-02-15 NOTE — Consult Note (Signed)
ANTICOAGULATION CONSULT NOTE  Pharmacy Consult for IV Heparin Indication: DVT   Patient Measurements: Height: '5\' 9"'$  (175.3 cm) Weight: 78 kg (171 lb 15.3 oz) IBW/kg (Calculated) : 70.7 Heparin Dosing Weight: 83.5 kg  Labs: Recent Labs    02/12/22 0450 02/12/22 1111 02/12/22 1414 02/12/22 2255 02/13/22 0535 02/13/22 1514 02/14/22 0425 02/14/22 1411 02/14/22 2327  HGB 14.1   < > 13.9  --  14.4  --  12.7*  --   --   HCT 42.6   < > 41.4  --  42.8  --  37.8*  --   --   PLT 35*   < > 43*  --  41*  --  40*  --   --   LABPROT 14.9  --   --   --   --   --   --   --   --   INR 1.2  --   --   --   --   --   --   --   --   HEPARINUNFRC 0.58  --  0.74*   < > 0.67   < > 0.69 0.68 0.46  CREATININE 2.40*  --   --   --  1.96*  --  1.79*  --   --    < > = values in this interval not displayed.    Estimated Creatinine Clearance: 31.3 mL/min (A) (by C-G formula based on SCr of 1.79 mg/dL (H)).  Medical History: Past Medical History:  Diagnosis Date   Arthritis    Cancer ([place])    COPD (chronic obstructive pulmonary disease) (HCC)    Hoarseness of voice    Hypertension    MRSA (methicillin resistant staph aureus) culture positive    Sick sinus syndrome (HCC)    Sleep apnea    Tremor     Medications:  Aspirin 81 PTA  Assessment: Patient is a 86 y/o M with medical history as above who presented to the ED 8/23 from PCP office with SOB x 2 weeks. Reported recent diastolic CHF diagnosis and diuretic initiation. Endorsing left foot swelling. Patient subsequently found to have LLE DVT. Pt was found to have thrombocytopenia (PLT 53k) on arrival to ED, per note from hematology low suspicion for HIT. Per hematology recs, ok to continue heparin drip. Pt was started on heparin drip without bolus due to thrombocytopenia. Started on dexamethasone 20 mg daily and IVIG for thrombocytopenia. Patient was not on anticoagulation prior to admission but does take aspirin 81 mg daily. V/Q scan was normal, no  evidence of PE. Pharmacy consulted to initiate and manage heparin infusion for VTE treatment.  Hgb and HCT stable, PLT 54>>35>>43>>40  Goal of Therapy:  Heparin level 0.3 - 0.5 units/ml (no bolus dosing given thrombocytopenia) Monitor platelets by anticoagulation protocol: Yes  8/24 0450 HL 0.58 8/24 1414 HL 0.74, supratherapeutic 8/24 2255 HL 0.62 8/25 0535 HL 0.67 8/25 1514 HL 0.40 therapeutic x1 8/25 2256 HL 0.49, therapeutic x 2 8/26 0425 HL 0.69, supratherapeutic 8/26 0425 HL 0.68, supratherapeutic 8/26 2327 HL 0.46, therapeutic x 1  Per protocol, decrease by 2 units/kg/hr due to lower goal of 0.3-0.5. However, because last decrease by 2 units/kg/hr did not result in a significant decrease (HL went from 0.69 >> 0.68), opting to decrease by 3 units/kg/hr this time.   Plan:  --Continue heparin infusion at 450 units/hr --Recheck HL in w/ AM labs to confirm --Daily CBC while on heparin --Monitor PLT due to thrombocytopenia  [name], PharmD, MBA 02/15/2022 1:00 AM

## 2022-02-16 DIAGNOSIS — D693 Immune thrombocytopenic purpura: Secondary | ICD-10-CM | POA: Diagnosis not present

## 2022-02-16 DIAGNOSIS — I824Z9 Acute embolism and thrombosis of unspecified deep veins of unspecified distal lower extremity: Secondary | ICD-10-CM | POA: Diagnosis not present

## 2022-02-16 DIAGNOSIS — G250 Essential tremor: Secondary | ICD-10-CM | POA: Diagnosis not present

## 2022-02-16 DIAGNOSIS — N179 Acute kidney failure, unspecified: Secondary | ICD-10-CM | POA: Diagnosis not present

## 2022-02-16 DIAGNOSIS — I700 Atherosclerosis of aorta: Secondary | ICD-10-CM | POA: Diagnosis not present

## 2022-02-16 LAB — CBC
HCT: 36.6 % — ABNORMAL LOW (ref 39.0–52.0)
HCT: 37.5 % — ABNORMAL LOW (ref 39.0–52.0)
Hemoglobin: 12.8 g/dL — ABNORMAL LOW (ref 13.0–17.0)
Hemoglobin: 12.6 g/dL — ABNORMAL LOW (ref 13.0–17.0)
MCH: 31.9 pg (ref 26.0–34.0)
MCH: 31 pg (ref 26.0–34.0)
MCHC: 35 g/dL (ref 30.0–36.0)
MCHC: 33.6 g/dL (ref 30.0–36.0)
MCV: 91.3 fL (ref 80.0–100.0)
MCV: 92.4 fL (ref 80.0–100.0)
Platelets: 44 10*3/uL — ABNORMAL LOW (ref 150–400)
Platelets: 54 10*3/uL — ABNORMAL LOW (ref 150–400)
RBC: 4.01 MIL/uL — ABNORMAL LOW (ref 4.22–5.81)
RBC: 4.06 MIL/uL — ABNORMAL LOW (ref 4.22–5.81)
RDW: 12.2 % (ref 11.5–15.5)
RDW: 12.4 % (ref 11.5–15.5)
WBC: 8.8 10*3/uL (ref 4.0–10.5)
WBC: 11.1 10*3/uL — ABNORMAL HIGH (ref 4.0–10.5)
nRBC: 1.9 % — ABNORMAL HIGH (ref 0.0–0.2)
nRBC: 1.9 % — ABNORMAL HIGH (ref 0.0–0.2)

## 2022-02-16 LAB — BASIC METABOLIC PANEL
Anion gap: 7 (ref 5–15)
Anion gap: 4 — ABNORMAL LOW (ref 5–15)
BUN: 55 mg/dL — ABNORMAL HIGH (ref 8–23)
BUN: 61 mg/dL — ABNORMAL HIGH (ref 8–23)
CO2: 23 mmol/L (ref 22–32)
CO2: 24 mmol/L (ref 22–32)
Calcium: 9 mg/dL (ref 8.9–10.3)
Calcium: 8.8 mg/dL — ABNORMAL LOW (ref 8.9–10.3)
Chloride: 105 mmol/L (ref 98–111)
Chloride: 105 mmol/L (ref 98–111)
Creatinine, Ser: 1.76 mg/dL — ABNORMAL HIGH (ref 0.61–1.24)
Creatinine, Ser: 1.69 mg/dL — ABNORMAL HIGH (ref 0.61–1.24)
GFR, Estimated: 38 mL/min — ABNORMAL LOW (ref >60)
GFR, Estimated: 40 mL/min — ABNORMAL LOW (ref >60)
Glucose, Bld: 163 mg/dL — ABNORMAL HIGH (ref 70–99)
Glucose, Bld: 156 mg/dL — ABNORMAL HIGH (ref 70–99)
Potassium: 4.1 mmol/L (ref 3.5–5.1)
Potassium: 4.8 mmol/L (ref 3.5–5.1)
Sodium: 135 mmol/L (ref 135–145)
Sodium: 133 mmol/L — ABNORMAL LOW (ref 135–145)

## 2022-02-16 LAB — GLUCOSE, CAPILLARY
Glucose-Capillary: 163 mg/dL — ABNORMAL HIGH (ref 70–99)
Glucose-Capillary: 170 mg/dL — ABNORMAL HIGH (ref 70–99)
Glucose-Capillary: 148 mg/dL — ABNORMAL HIGH (ref 70–99)

## 2022-02-16 LAB — HEPATITIS B SURFACE ANTIGEN: Hepatitis B Surface Ag: NONREACTIVE

## 2022-02-16 LAB — HEPARIN LEVEL (UNFRACTIONATED): Heparin Unfractionated: 0.41 IU/mL (ref 0.30–0.70)

## 2022-02-16 LAB — HAPTOGLOBIN: Haptoglobin: <10 mg/dL — ABNORMAL LOW (ref 38–329)

## 2022-02-16 LAB — HEPATITIS B CORE ANTIBODY, TOTAL: Hep B Core Total Ab: REACTIVE — AB

## 2022-02-16 MED ORDER — AMLODIPINE BESYLATE 10 MG PO TABS
10.0000 mg | ORAL_TABLET | Freq: Every day | ORAL | Status: DC
  Administered 2022-02-16 – 2022-02-18 (×3): 10 mg via ORAL
  Filled 2022-02-16 (×3): qty 1

## 2022-02-16 NOTE — Progress Notes (Signed)
Mobility Specialist - Progress Note   02/16/22 1527  Mobility  Activity Ambulated independently in hallway;Stood at bedside;Dangled on edge of bed  Level of Assistance Independent  Assistive Device None  Distance Ambulated (ft) 200 ft  Activity Response Tolerated well  $Mobility charge 1 Mobility   Pt semi-supine in bed on RA. Pt STS and ambulates in hallway indep with a chair follow for safety. Pt returns to recliner with needs in reach and chair alarm set.   Gretchen Short  Mobility Specialist  02/16/22 3:28 PM

## 2022-02-16 NOTE — Progress Notes (Signed)
  Darren Gonzales   DOB:11/15/1935   DY#:[id number]    Subjective: No worsening headaches. Patient denies any nausea vomiting.    Denies any bleeding gums or nose.  Pain in the leg improved.  Continues to complain of shortness of breath on exertion.  Patient's son by the bedside.  Objective:  Vitals:   02/16/22 1100 02/16/22 1540  BP: (!) 167/115 (!) 142/85  Pulse: (!) 59 64  Resp: (!) 21 20  Temp: 98.3 F (36.8 C) 98 F (36.7 C)  SpO2: 97% 97%     Intake/Output Summary (Last 24 hours) at 02/16/2022 1803 Last data filed at 02/16/2022 1516 Gross per 24 hour  Intake 703.83 ml  Output 700 ml  Net 3.83 ml  Patient currently on 2 L of nasal cannula oxygen.  Physical Exam Vitals and nursing note reviewed.  HENT:     Head: Normocephalic and atraumatic.     Mouth/Throat:     Pharynx: Oropharynx is clear.  Eyes:     Extraocular Movements: Extraocular movements intact.     Pupils: Pupils are equal, round, and reactive to light.  Cardiovascular:     Rate and Rhythm: Normal rate and regular rhythm.  Pulmonary:     Comments: Decreased breath sounds bilaterally.  Abdominal:     Palpations: Abdomen is soft.  Musculoskeletal:        General: Normal range of motion.     Cervical back: Normal range of motion.  Skin:    General: Skin is warm.  Neurological:     General: No focal deficit present.     Mental Status: He is alert and oriented to person, place, and time.  Psychiatric:        Behavior: Behavior normal.        Judgment: Judgment normal.      Labs:  Lab Results  Component Value Date   WBC 11.1 (H) 02/16/2022   HGB 12.6 (L) 02/16/2022   HCT 37.5 (L) 02/16/2022   MCV 92.4 02/16/2022   PLT 54 (L) 02/16/2022   NEUTROABS 5.3 02/12/2022    Lab Results  Component Value Date   NA 133 (L) 02/16/2022   K 4.8 02/16/2022   CL 105 02/16/2022   CO2 24 02/16/2022    Studies:  No results found.  86 year old male patient with multiple medical problems including CAD/CHF  chronic kidney disease stage IV is currently admitted to hospital for right lower extremity DVT; incidentally noted to have platelets of 35 on admission.   # Severe thrombocytopenia-platelets 35-40s-etiology is unclear however clinically suspicious of ITP.  Currently on dexamethasone 20 mg once a day day #2 today.   Platelets today 40s.    A] continue dexamethasone 40 mg a day.  B] s/p IVIG infusion 400 mg per metered square per day STARTED on 8/25. Currently on dose #4/5  No evidence of bleeding noted.   # Acute DVT of the LEFT lower extremity-continue IV heparin without bolus-given acute thrombocytopenia. VQ scan-NEG.    #CKD-stage IV stable   Discussed with patient's son, Darren Gonzales by the bedside.  Also discussed regarding possible need for bone marrow biopsy if patient's platelet counts do not improve in the next few days.  However clinically I am hopeful that the platelets should improve in the next 1 to 2 days.  All questions were answered.   Darren Sickle, MD 02/16/2022  6:03 PM

## 2022-02-16 NOTE — Care Management Important Message (Signed)
Important Message  Patient Details  Name: Darren Gonzales MRN: [id number] Date of Birth: [birth year]   Medicare Important Message Given:  Yes     Dannette Barbara 02/16/2022, 11:35 AM

## 2022-02-16 NOTE — Consult Note (Signed)
ANTICOAGULATION CONSULT NOTE  Pharmacy Consult for IV Heparin Indication: DVT   Patient Measurements: Height: '5\' 9"'$  (175.3 cm) Weight: 77.5 kg (170 lb 12.8 oz) IBW/kg (Calculated) : 70.7 Heparin Dosing Weight: 83.5 kg  Labs: Recent Labs    02/14/22 0425 02/14/22 1411 02/14/22 2327 02/15/22 0538 02/15/22 1607 02/16/22 0522  HGB 12.7*  --  13.5 12.7* 12.2* 12.8*  HCT 37.8*  --  39.1 36.5* 35.5* 36.6*  PLT 40*  --  58* 40* 43* 44*  HEPARINUNFRC 0.69 0.68 0.46 0.40  --   --   CREATININE 1.79*  --   --  1.62*  --   --     Estimated Creatinine Clearance: 34.5 mL/min (A) (by C-G formula based on SCr of 1.62 mg/dL (H)).  Medical History: Past Medical History:  Diagnosis Date   Arthritis    Cancer ([employer])    COPD (chronic obstructive pulmonary disease) (HCC)    Hoarseness of voice    Hypertension    MRSA (methicillin resistant staph aureus) culture positive    Sick sinus syndrome (HCC)    Sleep apnea    Tremor     Medications:  Aspirin 81 PTA  Assessment: Patient is a 86 y/o M with medical history as above who presented to the ED 8/23 from PCP office with SOB x 2 weeks. Reported recent diastolic CHF diagnosis and diuretic initiation. Endorsing left foot swelling. Patient subsequently found to have LLE DVT. Pt was found to have thrombocytopenia (PLT 53k) on arrival to ED, per note from hematology low suspicion for HIT. Per hematology recs, ok to continue heparin drip. Pt was started on heparin drip without bolus due to thrombocytopenia. Started on dexamethasone 20 mg daily and IVIG for thrombocytopenia. Patient was not on anticoagulation prior to admission but does take aspirin 81 mg daily. V/Q scan was normal, no evidence of PE. Pharmacy consulted to initiate and manage heparin infusion for VTE treatment.  Goal of Therapy:  Heparin level 0.3 - 0.5 units/ml (no bolus dosing given thrombocytopenia) Monitor platelets by anticoagulation protocol: Yes    Plan: heparin level  remains therapeutic --Continue heparin infusion at 450 units/hr --Recheck heparin level daily w/ w/ AM labs while therapeutic --Daily CBC while on heparin --Monitor PLT due to thrombocytopenia  Vallery Sa, PharmD, BCPS 02/16/2022 7:04 AM

## 2022-02-16 NOTE — Progress Notes (Signed)
PROGRESS NOTE    Darren Gonzales   TDD:[id number] DOB: 03/14/1936  DOA: 02/11/2022 Date of Service: 02/16/22 PCP: Rusty Aus, MD     Brief Narrative / Hospital Course:  Darren Gonzales is a 86 y.o. male with medical history significant of COPD, HTN, SSS s/p pacemaker,  OSA intolerant to CPAP, HFpEF (EF 50-55%), CKDIIIb, Gout, who presents to ED 02/11/22 from PCP office with complaints of 2 weeks of persistent sob with associated lower extremity swelling. He has also noted associated orthopnea, fatigue, as well chest pain unable to describe but notes it is intermittent. Assoc w/ chills, as well as coughing nonproductive.   He also was concerned about side-effect of gabapentin and was told to hold this medication by PCP as well as hold his amlodipine. He was also recently started on lasix 20 mg 3 days PTA. 08/23: (+)DVT LLE, low Plt, AKI w/ Cr 2.46 on CKD3b baseline Cr 1.9. BNP 414. A1C 6.0. Admitted to hospitalist service for DVT. Started heparin gtt w/o bolus, await VQ d/t AKI.  08/24: Cr 2.40. FeNa c/w pre-renal cause, UA no UTI, (+)ketones c/w dehydration. Hematology recs: see lab orders. Pt does not appear to be in heart failure exacerbation at this time based on exam, labs, CXR. VQ study normal. Continuing heparin drip.  08/25: Cr 1.96. HemOnc dx ITP, administering IVIG today. BNP trending down and no CHF exacerbation based on exam, still needing O2, will trial wean off this. C/o blurred vision per RN yesterday evening, CT head negative, cannot perform MRI d/t pacemaker 08/26: reports blurred vision resolved, he gets these episodes of feeling grainy sensation in eye and then redness. Cancelled MRI. Still low Plt, continuing IVIG and heparin per Hematology, also increasing prednisone. Plt 58. On room air now.  08/27: Plt back to 40. Cr improving 1.62.  08/28: Plt 44. Cr 1.76. BP higher today, changed antihypertensive regimen    Consultants: Vascular surgery (Dr Lucky Cowboy) for DVT possible  PE HemOnc (Dr Rogue Bussing) for thrombocytopenia   Procedures:  None    Subjective: Patient reports feeling well today, no complaints/concerns other than he is bruising very easily.  No chest pain/shortness of breath.     ASSESSMENT & PLAN:   Principal Problem:   DVT (deep venous thrombosis) (HCC) Active Problems:   Aortic atherosclerosis (HCC)   Benign essential tremor   Chronic diastolic CHF (congestive heart failure), NYHA class 3 (HCC)   Stage 3b chronic kidney disease (CKD) (HCC)   Essential hypertension   S/P placement of cardiac pacemaker   Sinoatrial node dysfunction (HCC)   Acute kidney injury superimposed on chronic kidney disease stage 3b (HCC)   Gout   Acute ITP (HCC)   Malnutrition of moderate degree   Shortness of breath   DVT (deep venous thrombosis) (HCC) Heparin drip  VQ no concern for PE Vascular surgery and hematology following Hematology advised remain on heparin for now    Thrombocytopenia ([place]) HemOnc following - c/w ITP -->  dexamethasone 20 mg p.o. daily x 4 days or so --> increased on 08/26 hold platelet transfusion unless platelets keep trending down to 30 or if bleeding IVIG infusion  Continue heparin for now Close monitor CBC Platelets have been stable, awaiting more significant upward trend and continuing IVIG.  Acute kidney injury superimposed on chronic kidney disease stage 3b (HCC) Prerenal based on FeNa, likely dehydration UA no UTI/casts IMPROVING  Holding nephrotoxic meds Caution w/ fluid resuscitation d/t hx HFpEF and slightly elevated BNP here though  this more likely due to poor renal clearance/age. Pt does not appear to be in heart failure exacerbation at this time based on exam, CXR. BNP actually trending down  consider renal US / nephro consult if no improvement but is trending appropriately  Shortness of breath -resolved Tachypnea w/o tachcyardia but he has SSS / pacemaker so may not have tachycardia  No drop in SpO2 but  pt has been more comfortable on 2L/min [place] CXR no concerns for infection or fluid overload Continue O2 --> wean as tolerated --> has been on RA   Chronic diastolic CHF (congestive heart failure), NYHA class 3 (HCC) Etiology HTN / OSA  HFpEF 50-55% Slight elevation BNP in ED but appears fluid compensated, CXR neg home meds (based on last cardiology note 11/2021: isosorbide ER 30 mg, propranolol ER 240 mg qhs, pravastatin 20 mg - pt reports not taking statin) Cont isosorbide added rosuvastatin lowered dose propranolol d/t borderline BP AKI precludes ACE/ARB or diuretics for now  Pt does not appear to be in heart failure exacerbation at this time   Essential hypertension BP elevated, was initially low, restarted most medications other than nephrotoxins  Benign essential tremor On propranolol and primidone at home Primidone held d/t AKI Cont propranolol but caution w/ recent low BP  Gout Holding colchicine d/t AKI  S/P placement of cardiac pacemaker D/t SSS Telemetry for now      DVT prophylaxis: heparin Code Status: FULL Family Communication: sons at bedside on rounds  Disposition Plan / TOC needs: home when stabilized Barriers to discharge / significant pending items: HemOnc recs, heparin drip, IVIG, low Plt              Objective: Vitals:   02/16/22 0729 02/16/22 1010 02/16/22 1038 02/16/22 1100  BP: (!) 157/89 (!) 167/110 (!) 181/110 (!) 167/115  Pulse: 60 72 60 (!) 59  Resp: 20   (!) 21  Temp: 98.2 F (36.8 C)   98.3 F (36.8 C)  TempSrc:      SpO2: 95% 100%  97%  Weight:      Height:        Intake/Output Summary (Last 24 hours) at 02/16/2022 1220 Last data filed at 02/16/2022 0700 Gross per 24 hour  Intake 106.9 ml  Output 200 ml  Net -93.1 ml   Filed Weights   02/11/22 2148 02/14/22 0416 02/16/22 0419  Weight: 75.1 kg 78 kg 77.5 kg    Examination:  Constitutional:  VS as above General Appearance: alert, well-developed, well-nourished,  NAD Eyes: Normal lids and conjunctive, non-icteric sclera Ears, Nose, Mouth, Throat: Normal external appearance MMM Neck: No masses, trachea midline, no JVD or carotid bruit Respiratory: Normal respiratory effort, CTA Cardiovascular: S1/S2 normal, RRR Gastrointestinal: No tenderness Musculoskeletal:  No clubbing/cyanosis of digits Symmetrical movement in all extremities Neurological: No cranial nerve deficit on limited exam Alert Psychiatric: Normal judgment/insight Normal mood and affect       Scheduled Medications:   amLODipine  10 mg Oral Daily   aspirin EC  81 mg Oral Daily   dexamethasone  40 mg Oral Daily   feeding supplement  237 mL Oral TID BM   isosorbide mononitrate  30 mg Oral Daily   multivitamin with minerals  1 tablet Oral Daily   pantoprazole  40 mg Oral Daily   propranolol ER  180 mg Oral Daily   rosuvastatin  10 mg Oral QHS    Continuous Infusions:  dextrose Stopped (02/15/22 1000)   heparin 450 Units/hr (02/15/22  1732)   IMMUNE GLOBULIN 10% (HUMAN) IV - For Fluid Restriction Only 169.6 mL/hr at 02/16/22 1115    PRN Medications:  acetaminophen **OR** acetaminophen, albuterol, ondansetron **OR** ondansetron (ZOFRAN) IV  Antimicrobials:  Anti-infectives (From admission, onward)    None       Data Reviewed: I have personally reviewed following labs and imaging studies  CBC: Recent Labs  Lab 02/12/22 1111 02/12/22 1414 02/13/22 0535 02/14/22 0425 02/14/22 2327 02/15/22 0538 02/15/22 1607 02/16/22 0522  WBC 7.7 6.6   < > 7.9 11.0* 8.7 9.9 8.8  NEUTROABS 4.6 5.3  --   --   --   --   --   --   HGB 14.6 13.9   < > 12.7* 13.5 12.7* 12.2* 12.8*  HCT 42.8 41.4   < > 37.8* 39.1 36.5* 35.5* 36.6*  MCV 93.4 94.1   < > 92.4 91.4 92.4 90.8 91.3  PLT 45* 43*   < > 40* 58* 40* 43* 44*   < > = values in this interval not displayed.   Basic Metabolic Panel: Recent Labs  Lab 02/12/22 0450 02/13/22 0535 02/14/22 0425 02/15/22 0538  02/16/22 0522  NA 143 142 137 138 135  K 4.1 4.4 4.3 4.2 4.1  CL 114* 109 112* 110 105  CO2 '24 26 22 23 23  '$ GLUCOSE 100* 160* 153* 168* 163*  BUN 49* 56* 57* 59* 55*  CREATININE 2.40* 1.96* 1.79* 1.62* 1.76*  CALCIUM 8.2* 9.9 9.4 9.3 9.0   GFR: Estimated Creatinine Clearance: 31.8 mL/min (A) (by C-G formula based on SCr of 1.76 mg/dL (H)). Liver Function Tests: Recent Labs  Lab 02/11/22 1255 02/12/22 0450  AST 30 24  ALT 22 20  ALKPHOS 70 61  BILITOT 1.6* 1.3*  PROT 6.2* 5.5*  ALBUMIN 3.2* 2.8*   No results for input(s): "LIPASE", "AMYLASE" in the last 168 hours. No results for input(s): "AMMONIA" in the last 168 hours. Coagulation Profile: Recent Labs  Lab 02/11/22 1947 02/12/22 0450  INR 1.2 1.2   Cardiac Enzymes: No results for input(s): "CKTOTAL", "CKMB", "CKMBINDEX", "TROPONINI" in the last 168 hours. BNP (last 3 results) No results for input(s): "PROBNP" in the last 8760 hours. HbA1C: No results for input(s): "HGBA1C" in the last 72 hours.  CBG: Recent Labs  Lab 02/15/22 0734 02/15/22 1141 02/15/22 1606 02/16/22 0728 02/16/22 1104  GLUCAP 152* 138* 203* 163* 170*   Lipid Profile: No results for input(s): "CHOL", "HDL", "LDLCALC", "TRIG", "CHOLHDL", "LDLDIRECT" in the last 72 hours. Thyroid Function Tests: No results for input(s): "TSH", "T4TOTAL", "FREET4", "T3FREE", "THYROIDAB" in the last 72 hours. Anemia Panel: No results for input(s): "VITAMINB12", "FOLATE", "FERRITIN", "TIBC", "IRON", "RETICCTPCT" in the last 72 hours. Urine analysis:    Component Value Date/Time   COLORURINE YELLOW (A) 02/12/2022 0940   APPEARANCEUR CLEAR (A) 02/12/2022 0940   LABSPEC 1.020 02/12/2022 0940   PHURINE 5.0 02/12/2022 0940   GLUCOSEU NEGATIVE 02/12/2022 0940   HGBUR NEGATIVE 02/12/2022 0940   BILIRUBINUR NEGATIVE 02/12/2022 0940   KETONESUR 5 (A) 02/12/2022 0940   PROTEINUR NEGATIVE 02/12/2022 0940   NITRITE NEGATIVE 02/12/2022 0940   LEUKOCYTESUR NEGATIVE  02/12/2022 0940   Sepsis Labs: '@LABRCNTIP'$ (procalcitonin:4,lacticidven:4)  No results found for this or any previous visit (from the past 240 hour(s)).       Radiology Studies: US Venous Img Lower Unilateral Left  Result Date: 02/11/2022 CLINICAL DATA:  Swelling and pain EXAM: Left LOWER EXTREMITY VENOUS DOPPLER ULTRASOUND TECHNIQUE: Gray-scale sonography with graded  compression, as well as color Doppler and duplex ultrasound were performed to evaluate the lower extremity deep venous systems from the level of the common femoral vein and including the common femoral, femoral, profunda femoral, popliteal and calf veins including the posterior tibial, peroneal and gastrocnemius veins when visible. The superficial great saphenous vein was also interrogated. Spectral Doppler was utilized to evaluate flow at rest and with distal augmentation maneuvers in the common femoral, femoral and popliteal veins. COMPARISON:  None Available. FINDINGS: Contralateral Common Femoral Vein: Respiratory phasicity is normal and symmetric with the symptomatic side. No evidence of thrombus. Normal compressibility. Common Femoral Vein: No evidence of thrombus. Normal compressibility, respiratory phasicity and response to augmentation. Saphenofemoral Junction: No evidence of thrombus. Normal compressibility and flow on color Doppler imaging. Profunda Femoral Vein: No evidence of thrombus. Normal compressibility and flow on color Doppler imaging. Femoral Vein: No evidence of thrombus. Normal compressibility, respiratory phasicity and response to augmentation. Popliteal Vein: Positive for occlusive thrombus.  Noncompressible. Calf Veins: Positive for occlusive thrombus within the posterior tibial and peroneal veins. Noncompressible. IMPRESSION: Positive for acute occlusive DVT involving the calf veins and popliteal vein. Critical Value/emergent results were called by telephone at the time of interpretation on 02/11/2022 at 5:48 pm to  provider [facility] , who verbally acknowledged these results. Electronically Signed   By: Donavan Foil M.D.   On: 02/11/2022 17:48   DG Chest 2 View  Result Date: 02/11/2022 CLINICAL DATA:  Shortness of breath x2 weeks EXAM: CHEST - 2 VIEW COMPARISON:  10/24/2019 FINDINGS: Cardiac size is within normal limits. There are no signs of pulmonary edema or focal pulmonary consolidation. There is no pleural effusion or pneumothorax. There is minimal blunting of right lateral CP angle. Pacemaker battery is seen in the left infraclavicular region. There is no pneumothorax. IMPRESSION: There are no signs of pulmonary edema or focal pulmonary consolidation. Blunting of right lateral CP angle may suggest minimal effusion or pleural thickening. Electronically Signed   By: Elmer Picker M.D.   On: 02/11/2022 13:42            LOS: 5 days   Emeterio Reeve, DO [facility] 02/16/2022, 12:20 PM   Staff may message me via secure chat in [place]  but this may not receive immediate response,  please page for urgent matters!  If 7PM-7AM, please contact night-coverage [email or web address]  Dictation software was used to generate the above note. Typos may occur and escape review, as with typed/written notes. Please contact Dr Sheppard Coil directly for clarity if needed.

## 2022-02-17 DIAGNOSIS — N179 Acute kidney failure, unspecified: Secondary | ICD-10-CM | POA: Diagnosis not present

## 2022-02-17 DIAGNOSIS — I824Z9 Acute embolism and thrombosis of unspecified deep veins of unspecified distal lower extremity: Secondary | ICD-10-CM | POA: Diagnosis not present

## 2022-02-17 DIAGNOSIS — I824Y2 Acute embolism and thrombosis of unspecified deep veins of left proximal lower extremity: Secondary | ICD-10-CM | POA: Diagnosis not present

## 2022-02-17 DIAGNOSIS — D693 Immune thrombocytopenic purpura: Secondary | ICD-10-CM | POA: Diagnosis not present

## 2022-02-17 DIAGNOSIS — I700 Atherosclerosis of aorta: Secondary | ICD-10-CM | POA: Diagnosis not present

## 2022-02-17 LAB — BASIC METABOLIC PANEL
Anion gap: 3 — ABNORMAL LOW (ref 5–15)
BUN: 58 mg/dL — ABNORMAL HIGH (ref 8–23)
CO2: 22 mmol/L (ref 22–32)
Calcium: 8.6 mg/dL — ABNORMAL LOW (ref 8.9–10.3)
Chloride: 108 mmol/L (ref 98–111)
Creatinine, Ser: 1.65 mg/dL — ABNORMAL HIGH (ref 0.61–1.24)
GFR, Estimated: 41 mL/min — ABNORMAL LOW (ref >60)
Glucose, Bld: 237 mg/dL — ABNORMAL HIGH (ref 70–99)
Potassium: 3.8 mmol/L (ref 3.5–5.1)
Sodium: 133 mmol/L — ABNORMAL LOW (ref 135–145)

## 2022-02-17 LAB — CBC
HCT: 37.7 % — ABNORMAL LOW (ref 39.0–52.0)
Hemoglobin: 12.9 g/dL — ABNORMAL LOW (ref 13.0–17.0)
MCH: 31.5 pg (ref 26.0–34.0)
MCHC: 34.2 g/dL (ref 30.0–36.0)
MCV: 92 fL (ref 80.0–100.0)
Platelets: 63 10*3/uL — ABNORMAL LOW (ref 150–400)
RBC: 4.1 MIL/uL — ABNORMAL LOW (ref 4.22–5.81)
RDW: 12.6 % (ref 11.5–15.5)
WBC: 11.3 10*3/uL — ABNORMAL HIGH (ref 4.0–10.5)
nRBC: 1.9 % — ABNORMAL HIGH (ref 0.0–0.2)

## 2022-02-17 LAB — GLUCOSE, CAPILLARY
Glucose-Capillary: 153 mg/dL — ABNORMAL HIGH (ref 70–99)
Glucose-Capillary: 391 mg/dL — ABNORMAL HIGH (ref 70–99)
Glucose-Capillary: 255 mg/dL — ABNORMAL HIGH (ref 70–99)
Glucose-Capillary: 213 mg/dL — ABNORMAL HIGH (ref 70–99)

## 2022-02-17 LAB — HEPARIN LEVEL (UNFRACTIONATED): Heparin Unfractionated: 0.4 IU/mL (ref 0.30–0.70)

## 2022-02-17 NOTE — Progress Notes (Signed)
Mobility Specialist - Progress Note   02/17/22 0955  Mobility  Activity Ambulated independently in hallway;Stood at bedside;Dangled on edge of bed  Level of Assistance Independent  Assistive Device None  Distance Ambulated (ft) 280 ft  Activity Response Tolerated well  $Mobility charge 1 Mobility   Pt semi-supine in bed on RA upon arrival. Pt STS and ambulates in hallway indep. Pt's pace vastly improved from previous session with no complaints. Pt returns to bed with needs in reach.   Gretchen Short  Mobility Specialist  02/17/22 9:57 AM

## 2022-02-17 NOTE — Consult Note (Signed)
ANTICOAGULATION CONSULT NOTE  Pharmacy Consult for IV Heparin Indication: DVT   Patient Measurements: Height: '5\' 9"'$  (175.3 cm) Weight: 77.3 kg (170 lb 6.7 oz) IBW/kg (Calculated) : 70.7 Heparin Dosing Weight: 83.5 kg  Labs: Recent Labs    02/15/22 0538 02/15/22 1607 02/16/22 0522 02/16/22 1547 02/17/22 0513  HGB 12.7* 12.2* 12.8* 12.6*  --   HCT 36.5* 35.5* 36.6* 37.5*  --   PLT 40* 43* 44* 54*  --   HEPARINUNFRC 0.40  --  0.41  --  0.40  CREATININE 1.62*  --  1.76* 1.69*  --     Estimated Creatinine Clearance: 32.5 mL/min (A) (by C-G formula based on SCr of 1.69 mg/dL (H)).  Medical History: Past Medical History:  Diagnosis Date   Arthritis    Cancer ([place])    COPD (chronic obstructive pulmonary disease) (HCC)    Hoarseness of voice    Hypertension    MRSA (methicillin resistant staph aureus) culture positive    Sick sinus syndrome (HCC)    Sleep apnea    Tremor     Medications:  Aspirin 81 PTA  Assessment: Patient is a 86 y/o M with medical history as above who presented to the ED 8/23 from PCP office with SOB x 2 weeks. Reported recent diastolic CHF diagnosis and diuretic initiation. Endorsing left foot swelling. Patient subsequently found to have LLE DVT. Pt was found to have thrombocytopenia (PLT 53k) on arrival to ED, per note from hematology low suspicion for HIT. Per hematology recs, ok to continue heparin drip. Pt was started on heparin drip without bolus due to thrombocytopenia. Started on dexamethasone 20 mg daily and IVIG for thrombocytopenia. Patient was not on anticoagulation prior to admission but does take aspirin 81 mg daily. V/Q scan was normal, no evidence of PE. Pharmacy consulted to initiate and manage heparin infusion for VTE treatment.  Goal of Therapy:  Heparin level 0.3 - 0.5 units/ml (no bolus dosing given thrombocytopenia) Monitor platelets by anticoagulation protocol: Yes    Plan: heparin level remains therapeutic 8/29 @ 0513 = 0.4,  therapeutic X 4 Will continue pt on current rate and recheck HL on 8/30 with AM labs.   Kellsey Sansone D 02/17/2022 6:08 AM

## 2022-02-17 NOTE — Progress Notes (Signed)
Mobility Specialist - Progress Note   02/17/22 1322  Mobility  Activity Ambulated independently in hallway;Stood at bedside;Ambulated independently to bathroom;Dangled on edge of bed  Level of Assistance Independent  Distance Ambulated (ft) 360 ft  Activity Response Tolerated well  $Mobility charge 1 Mobility   Pt semi-supine in bed on RA upon arrival. Pt STS and ambulates in hallway and to restroom indep. Pt returns to bed with needs in reach.   Gretchen Short  Mobility Specialist  02/17/22 1:23 PM

## 2022-02-17 NOTE — Progress Notes (Signed)
  Darren Gonzales   DOB:Nov 24, 1935   IR#:[id number]    Subjective: No worsening headaches. Patient denies any nausea vomiting.    Denies any bleeding gums or nose.  Pain in the leg improved.  Continues to complain of shortness of breath on exertion.  Patient is alone.   Objective:  Vitals:   02/17/22 1256 02/17/22 1450  BP: 139/87 138/75  Pulse: 60 61  Resp:  18  Temp: 97.7 F (36.5 C) 98.3 F (36.8 C)  SpO2: 94% 97%     Intake/Output Summary (Last 24 hours) at 02/17/2022 1919 Last data filed at 02/17/2022 1800 Gross per 24 hour  Intake 290.84 ml  Output 1300 ml  Net -1009.16 ml  Patient currently on 2 L of nasal cannula oxygen.  Physical Exam Vitals and nursing note reviewed.  HENT:     Head: Normocephalic and atraumatic.     Mouth/Throat:     Pharynx: Oropharynx is clear.  Eyes:     Extraocular Movements: Extraocular movements intact.     Pupils: Pupils are equal, round, and reactive to light.  Cardiovascular:     Rate and Rhythm: Normal rate and regular rhythm.  Pulmonary:     Comments: Decreased breath sounds bilaterally.  Abdominal:     Palpations: Abdomen is soft.  Musculoskeletal:        General: Normal range of motion.     Cervical back: Normal range of motion.  Skin:    General: Skin is warm.  Neurological:     General: No focal deficit present.     Mental Status: He is alert and oriented to person, place, and time.  Psychiatric:        Behavior: Behavior normal.        Judgment: Judgment normal.      Labs:  Lab Results  Component Value Date   WBC 11.3 (H) 02/17/2022   HGB 12.9 (L) 02/17/2022   HCT 37.7 (L) 02/17/2022   MCV 92.0 02/17/2022   PLT 63 (L) 02/17/2022   NEUTROABS 5.3 02/12/2022    Lab Results  Component Value Date   NA 133 (L) 02/17/2022   K 3.8 02/17/2022   CL 108 02/17/2022   CO2 22 02/17/2022    Studies:  No results found.  86 year old male patient with multiple medical problems including CAD/CHF chronic kidney disease  stage IV is currently admitted to hospital for right lower extremity DVT; incidentally noted to have platelets of 35 on admission.   # Severe thrombocytopenia-platelets 35-40s-etiology is unclear however clinically suspicious of ITP.  Platelets today 63.  Platelets improving slowly but steadily.   A] continue dexamethasone 40 mg a day; B] s/p IVIG infusion 400 mg per metered square per day STARTED on 8/25. Currently on dose #5/5  No evidence of bleeding noted.  Patient's platelets continue to improve > 50-60 I think it is reasonable to discharge patient home on Eliquis 5 mg twice daily.    # Acute DVT of the LEFT lower extremity-continue IV heparin without bolus-given acute thrombocytopenia. VQ scan-NEG. See above re: transitioning to [place].    #CKD-stage IV stable    [name], MD 02/17/2022  7:19 PM

## 2022-02-17 NOTE — Inpatient Diabetes Management (Signed)
Inpatient Diabetes Program Recommendations  AACE/ADA: New Consensus Statement on Inpatient Glycemic Control   Target Ranges:  Prepandial:   less than 140 mg/dL      Peak postprandial:   less than 180 mg/dL (1-2 hours)      Critically ill patients:  140 - 180 mg/dL    Latest Reference Range & Units 02/17/22 07:26 02/17/22 11:22  Glucose-Capillary 70 - 99 mg/dL 153 (H) 391 (H)   Review of Glycemic Control  Diabetes history: No Outpatient Diabetes medications: NA Current orders for Inpatient glycemic control: None; Decadron 40 mg daily  Inpatient Diabetes Program Recommendations:    Insulin: While ordered steroids, please consider ordering CBGs AC&HS and Novolog 0-9 units AC&HS.  Thanks, Barnie Alderman, RN, MSN, [place] Diabetes Coordinator Inpatient Diabetes Program [phone] (Team Pager from 8am to [place])

## 2022-02-17 NOTE — Progress Notes (Signed)
PROGRESS NOTE    Darren Gonzales   MLY:[id number] DOB: 07-18-1935  DOA: 02/11/2022 Date of Service: 02/17/22 PCP: Rusty Aus, MD     Brief Narrative / Hospital Course:  Darren Gonzales is a 86 y.o. male with medical history significant of COPD, HTN, SSS s/p pacemaker,  OSA intolerant to CPAP, HFpEF (EF 50-55%), CKDIIIb, Gout, who presents to ED 02/11/22 from PCP office with complaints of 2 weeks of persistent sob with associated lower extremity swelling. He has also noted associated orthopnea, fatigue, as well chest pain unable to describe but notes it is intermittent. Assoc w/ chills, as well as coughing nonproductive.   He also was concerned about side-effect of gabapentin and was told to hold this medication by PCP as well as hold his amlodipine. He was also recently started on lasix 20 mg 3 days PTA. 08/23: (+)DVT LLE, low Plt, AKI w/ Cr 2.46 on CKD3b baseline Cr 1.9. BNP 414. A1C 6.0. Admitted to hospitalist service for DVT. Started heparin gtt w/o bolus, await VQ d/t AKI.  08/24: Cr 2.40. FeNa c/w pre-renal cause, UA no UTI, (+)ketones c/w dehydration. Hematology recs: see lab orders. Pt does not appear to be in heart failure exacerbation at this time based on exam, labs, CXR. VQ study normal. Continuing heparin drip.  08/25: Cr 1.96. HemOnc dx ITP, starting IVIG today. BNP trending down and no CHF exacerbation based on exam, still needing O2, will trial wean off this. C/o blurred vision per RN yesterday evening, CT head negative, cannot perform MRI d/t pacemaker 08/26: reports blurred vision resolved, he gets these episodes of feeling grainy sensation in eye and then redness. Cancelled MRI. Still low Plt, continuing IVIG and heparin per Hematology, also increasing prednisone. Plt 58. On room air now.  08/27: Plt back to 40. Cr improving 1.62.  08/28: Plt 44. Cr 1.76. BP higher today, changed antihypertensive regimen  08/29: still on IVIG   Consultants: Vascular surgery (Dr Lucky Cowboy)  for DVT possible PE HemOnc (Dr Rogue Bussing) for thrombocytopenia, heparin management DVT  Procedures:  None    Subjective: Patient reports feeling well today, no complaints/concerns. [birth year]!  No chest pain/shortness of breath.     ASSESSMENT & PLAN:   Principal Problem:   DVT (deep venous thrombosis) (HCC) Active Problems:   Aortic atherosclerosis (HCC)   Benign essential tremor   Chronic diastolic CHF (congestive heart failure), NYHA class 3 (HCC)   Stage 3b chronic kidney disease (CKD) (HCC)   Essential hypertension   S/P placement of cardiac pacemaker   Sinoatrial node dysfunction (HCC)   Acute kidney injury superimposed on chronic kidney disease stage 3b (HCC)   Gout   Acute ITP (HCC)   Malnutrition of moderate degree   Shortness of breath   DVT (deep venous thrombosis) (HCC) Heparin drip  VQ no concern for PE Vascular surgery and hematology following Hematology advised remain on heparin for now    Thrombocytopenia ([facility]) HemOnc following - c/w ITP -->  dexamethasone 20 mg p.o. daily x 4 days or so --> increased on 08/26 hold platelet transfusion unless platelets keep trending down to 30 or if bleeding IVIG infusion  Continue heparin for now Close monitor CBC Platelets have been stable, awaiting more significant upward trend and continuing IVIG.  Acute kidney injury superimposed on chronic kidney disease stage 3b (HCC) Prerenal based on FeNa, likely dehydration UA no UTI/casts IMPROVING  Holding nephrotoxic meds Follow BMP  Shortness of breath -resolved Continue O2 -->  wean as tolerated --> has been on RA   Chronic diastolic CHF (congestive heart failure), NYHA class 3 (HCC) Etiology HTN / OSA  HFpEF 50-55% Slight elevation BNP in ED but appears fluid compensated, CXR neg home meds (based on last cardiology note 11/2021: isosorbide ER 30 mg, propranolol ER 240 mg qhs, pravastatin 20 mg - pt reports not taking statin) Cont  isosorbide added rosuvastatin lowered dose propranolol d/t borderline BP AKI precludes ACE/ARB or diuretics for now  Pt does not appear to be in heart failure exacerbation at this time   Essential hypertension BP elevated, was initially low, restarted most medications other than nephrotoxins  Benign essential tremor On propranolol and primidone at home Primidone held d/t AKI Cont propranolol but caution w/ recent low BP  Gout Holding colchicine d/t AKI  S/P placement of cardiac pacemaker D/t SSS Telemetry for now      DVT prophylaxis: heparin Code Status: FULL Family Communication: sons at bedside on rounds  Disposition Plan / TOC needs: home when stabilized Barriers to discharge / significant pending items: HemOnc recs, heparin drip, IVIG, low Plt              Objective: Vitals:   02/17/22 1055 02/17/22 1116 02/17/22 1149 02/17/22 1256  BP: 130/79 134/75 (!) 139/90 139/87  Pulse: 60 (!) 58 62 60  Resp: '20 20 18   '$ Temp: 98.2 F (36.8 C) 97.9 F (36.6 C) (!) 96 F (35.6 C) 97.7 F (36.5 C)  TempSrc:      SpO2: 93% 92% 94% 94%  Weight:      Height:        Intake/Output Summary (Last 24 hours) at 02/17/2022 1316 Last data filed at 02/17/2022 5366 Gross per 24 hour  Intake 704.82 ml  Output 1600 ml  Net -895.18 ml   Filed Weights   02/14/22 0416 02/16/22 0419 02/17/22 0500  Weight: 78 kg 77.5 kg 77.3 kg    Examination:  Constitutional:  VS as above General Appearance: alert, well-developed, well-nourished, NAD Eyes: Normal lids and conjunctive, non-icteric sclera Ears, Nose, Mouth, Throat: Normal external appearance MMM Neck: No masses, trachea midline, no JVD or carotid bruit Respiratory: Normal respiratory effort, CTA Cardiovascular: S1/S2 normal, RRR Gastrointestinal: No tenderness Musculoskeletal:  No clubbing/cyanosis of digits Symmetrical movement in all extremities Neurological: No cranial nerve deficit on limited  exam Alert Psychiatric: Normal judgment/insight Normal mood and affect       Scheduled Medications:   amLODipine  10 mg Oral Daily   aspirin EC  81 mg Oral Daily   dexamethasone  40 mg Oral Daily   feeding supplement  237 mL Oral TID BM   isosorbide mononitrate  30 mg Oral Daily   multivitamin with minerals  1 tablet Oral Daily   pantoprazole  40 mg Oral Daily   propranolol ER  180 mg Oral Daily   rosuvastatin  10 mg Oral QHS    Continuous Infusions:  dextrose Stopped (02/17/22 1256)   heparin 450 Units/hr (02/15/22 1732)    PRN Medications:  acetaminophen **OR** acetaminophen, albuterol, ondansetron **OR** ondansetron (ZOFRAN) IV  Antimicrobials:  Anti-infectives (From admission, onward)    None       Data Reviewed: I have personally reviewed following labs and imaging studies  CBC: Recent Labs  Lab 02/12/22 1111 02/12/22 1414 02/13/22 0535 02/14/22 2327 02/15/22 0538 02/15/22 1607 02/16/22 0522 02/16/22 1547  WBC 7.7 6.6   < > 11.0* 8.7 9.9 8.8 11.1*  NEUTROABS 4.6 5.3  --   --   --   --   --   --  HGB 14.6 13.9   < > 13.5 12.7* 12.2* 12.8* 12.6*  HCT 42.8 41.4   < > 39.1 36.5* 35.5* 36.6* 37.5*  MCV 93.4 94.1   < > 91.4 92.4 90.8 91.3 92.4  PLT 45* 43*   < > 58* 40* 43* 44* 54*   < > = values in this interval not displayed.   Basic Metabolic Panel: Recent Labs  Lab 02/13/22 0535 02/14/22 0425 02/15/22 0538 02/16/22 0522 02/16/22 1547  NA 142 137 138 135 133*  K 4.4 4.3 4.2 4.1 4.8  CL 109 112* 110 105 105  CO2 '26 22 23 23 24  '$ GLUCOSE 160* 153* 168* 163* 156*  BUN 56* 57* 59* 55* 61*  CREATININE 1.96* 1.79* 1.62* 1.76* 1.69*  CALCIUM 9.9 9.4 9.3 9.0 8.8*   GFR: Estimated Creatinine Clearance: 32.5 mL/min (A) (by C-G formula based on SCr of 1.69 mg/dL (H)). Liver Function Tests: Recent Labs  Lab 02/11/22 1255 02/12/22 0450  AST 30 24  ALT 22 20  ALKPHOS 70 61  BILITOT 1.6* 1.3*  PROT 6.2* 5.5*  ALBUMIN 3.2* 2.8*   No results  for input(s): "LIPASE", "AMYLASE" in the last 168 hours. No results for input(s): "AMMONIA" in the last 168 hours. Coagulation Profile: Recent Labs  Lab 02/11/22 1947 02/12/22 0450  INR 1.2 1.2   Cardiac Enzymes: No results for input(s): "CKTOTAL", "CKMB", "CKMBINDEX", "TROPONINI" in the last 168 hours. BNP (last 3 results) No results for input(s): "PROBNP" in the last 8760 hours. HbA1C: No results for input(s): "HGBA1C" in the last 72 hours.  CBG: Recent Labs  Lab 02/16/22 0728 02/16/22 1104 02/16/22 1542 02/17/22 0726 02/17/22 1122  GLUCAP 163* 170* 148* 153* 391*   Lipid Profile: No results for input(s): "CHOL", "HDL", "LDLCALC", "TRIG", "CHOLHDL", "LDLDIRECT" in the last 72 hours. Thyroid Function Tests: No results for input(s): "TSH", "T4TOTAL", "FREET4", "T3FREE", "THYROIDAB" in the last 72 hours. Anemia Panel: No results for input(s): "VITAMINB12", "FOLATE", "FERRITIN", "TIBC", "IRON", "RETICCTPCT" in the last 72 hours. Urine analysis:    Component Value Date/Time   COLORURINE YELLOW (A) 02/12/2022 0940   APPEARANCEUR CLEAR (A) 02/12/2022 0940   LABSPEC 1.020 02/12/2022 0940   PHURINE 5.0 02/12/2022 0940   GLUCOSEU NEGATIVE 02/12/2022 0940   HGBUR NEGATIVE 02/12/2022 0940   BILIRUBINUR NEGATIVE 02/12/2022 0940   KETONESUR 5 (A) 02/12/2022 0940   PROTEINUR NEGATIVE 02/12/2022 0940   NITRITE NEGATIVE 02/12/2022 0940   LEUKOCYTESUR NEGATIVE 02/12/2022 0940   Sepsis Labs: '@LABRCNTIP'$ (procalcitonin:4,lacticidven:4)  No results found for this or any previous visit (from the past 240 hour(s)).       Radiology Studies: US Venous Img Lower Unilateral Left  Result Date: 02/11/2022 CLINICAL DATA:  Swelling and pain EXAM: Left LOWER EXTREMITY VENOUS DOPPLER ULTRASOUND TECHNIQUE: Gray-scale sonography with graded compression, as well as color Doppler and duplex ultrasound were performed to evaluate the lower extremity deep venous systems from the level of the  common femoral vein and including the common femoral, femoral, profunda femoral, popliteal and calf veins including the posterior tibial, peroneal and gastrocnemius veins when visible. The superficial great saphenous vein was also interrogated. Spectral Doppler was utilized to evaluate flow at rest and with distal augmentation maneuvers in the common femoral, femoral and popliteal veins. COMPARISON:  None Available. FINDINGS: Contralateral Common Femoral Vein: Respiratory phasicity is normal and symmetric with the symptomatic side. No evidence of thrombus. Normal compressibility. Common Femoral Vein: No evidence of thrombus. Normal compressibility, respiratory phasicity and response to augmentation.  Saphenofemoral Junction: No evidence of thrombus. Normal compressibility and flow on color Doppler imaging. Profunda Femoral Vein: No evidence of thrombus. Normal compressibility and flow on color Doppler imaging. Femoral Vein: No evidence of thrombus. Normal compressibility, respiratory phasicity and response to augmentation. Popliteal Vein: Positive for occlusive thrombus.  Noncompressible. Calf Veins: Positive for occlusive thrombus within the posterior tibial and peroneal veins. Noncompressible. IMPRESSION: Positive for acute occlusive DVT involving the calf veins and popliteal vein. Critical Value/emergent results were called by telephone at the time of interpretation on 02/11/2022 at 5:48 pm to provider [facility] , who verbally acknowledged these results. Electronically Signed   By: Donavan Foil M.D.   On: 02/11/2022 17:48   DG Chest 2 View  Result Date: 02/11/2022 CLINICAL DATA:  Shortness of breath x2 weeks EXAM: CHEST - 2 VIEW COMPARISON:  10/24/2019 FINDINGS: Cardiac size is within normal limits. There are no signs of pulmonary edema or focal pulmonary consolidation. There is no pleural effusion or pneumothorax. There is minimal blunting of right lateral CP angle. Pacemaker battery is seen in the left  infraclavicular region. There is no pneumothorax. IMPRESSION: There are no signs of pulmonary edema or focal pulmonary consolidation. Blunting of right lateral CP angle may suggest minimal effusion or pleural thickening. Electronically Signed   By: Elmer Picker M.D.   On: 02/11/2022 13:42            LOS: 6 days   Emeterio Reeve, DO [facility] 02/17/2022, 1:16 PM   Staff may message me via secure chat in [place]  but this may not receive immediate response,  please page for urgent matters!  If 7PM-7AM, please contact night-coverage [email or web address]  Dictation software was used to generate the above note. Typos may occur and escape review, as with typed/written notes. Please contact Dr Sheppard Coil directly for clarity if needed.

## 2022-02-18 ENCOUNTER — Other Ambulatory Visit: Payer: Self-pay | Admitting: Internal Medicine

## 2022-02-18 ENCOUNTER — Other Ambulatory Visit: Payer: Self-pay

## 2022-02-18 ENCOUNTER — Other Ambulatory Visit (HOSPITAL_COMMUNITY): Payer: Self-pay

## 2022-02-18 ENCOUNTER — Encounter: Payer: Self-pay | Admitting: Internal Medicine

## 2022-02-18 ENCOUNTER — Telehealth (HOSPITAL_COMMUNITY): Payer: Self-pay | Admitting: Pharmacy Technician

## 2022-02-18 DIAGNOSIS — I824Z9 Acute embolism and thrombosis of unspecified deep veins of unspecified distal lower extremity: Secondary | ICD-10-CM | POA: Diagnosis not present

## 2022-02-18 LAB — GLUCOSE, CAPILLARY: Glucose-Capillary: 244 mg/dL — ABNORMAL HIGH (ref 70–99)

## 2022-02-18 LAB — CBC
HCT: 38 % — ABNORMAL LOW (ref 39.0–52.0)
Hemoglobin: 13.1 g/dL (ref 13.0–17.0)
MCH: 32 pg (ref 26.0–34.0)
MCHC: 34.5 g/dL (ref 30.0–36.0)
MCV: 92.7 fL (ref 80.0–100.0)
Platelets: 65 10*3/uL — ABNORMAL LOW (ref 150–400)
RBC: 4.1 MIL/uL — ABNORMAL LOW (ref 4.22–5.81)
RDW: 12.6 % (ref 11.5–15.5)
WBC: 10.3 10*3/uL (ref 4.0–10.5)
nRBC: 1.2 % — ABNORMAL HIGH (ref 0.0–0.2)

## 2022-02-18 LAB — BASIC METABOLIC PANEL
Anion gap: 5 (ref 5–15)
BUN: 55 mg/dL — ABNORMAL HIGH (ref 8–23)
CO2: 23 mmol/L (ref 22–32)
Calcium: 8.6 mg/dL — ABNORMAL LOW (ref 8.9–10.3)
Chloride: 108 mmol/L (ref 98–111)
Creatinine, Ser: 1.65 mg/dL — ABNORMAL HIGH (ref 0.61–1.24)
GFR, Estimated: 41 mL/min — ABNORMAL LOW (ref >60)
Glucose, Bld: 174 mg/dL — ABNORMAL HIGH (ref 70–99)
Potassium: 4 mmol/L (ref 3.5–5.1)
Sodium: 136 mmol/L (ref 135–145)

## 2022-02-18 LAB — HEPARIN LEVEL (UNFRACTIONATED): Heparin Unfractionated: 0.35 IU/mL (ref 0.30–0.70)

## 2022-02-18 MED ORDER — APIXABAN 5 MG PO TABS: ORAL_TABLET | ORAL | 3 refills | Status: DC

## 2022-02-18 NOTE — Progress Notes (Signed)
Lab orders entered

## 2022-02-18 NOTE — Discharge Summary (Signed)
Physician Discharge Summary  Darren Gonzales QMG:[id number] DOB: Feb 25, 1936 DOA: 02/11/2022  PCP: Rusty Aus, MD  Admit date: 02/11/2022  Discharge date: 02/18/2022  Admitted From: Home.  Disposition:  Home.  Recommendations for Outpatient Follow-up:  Follow up with PCP in 1-2 weeks. Please obtain BMP/CBC in one week Advised to follow-up with Dr. Rogue Bussing as scheduled. Advised to take Eliquis 5 mg twice daily for DVT.  Home Health:None Equipment/Devices:None  Discharge Condition: Stable CODE STATUS:Full code Diet recommendation: Heart Healthy   Brief Summary/ Hospital Course: Darren Gonzales is a 86 y.o. male with medical history significant of COPD, HTN, SSS s/p pacemaker,  OSA intolerant to CPAP, HFpEF (EF 50-55%), CKDIIIb, Gout, who presents to ED 02/11/22 from PCP office with complaints of 2 weeks of persistent sob with associated lower extremity swelling. He has also noted associated orthopnea, fatigue, as well chest pain,  unable to describe but notes it is intermittent. Assoc w/ chills, as well as coughing nonproductive.   He also was concerned about side-effect of gabapentin and was told to hold this medication by PCP as well as hold his amlodipine. He was also recently started on lasix 20 mg 3 days PTA. 08/23: (+)DVT LLE, low Plt, AKI w/ Cr 2.46 on CKD3b baseline Cr 1.9. BNP 414. A1C 6.0. Admitted to hospitalist service for DVT. Started heparin gtt w/o bolus, await VQ d/t AKI.  08/24: Cr 2.40. FeNa c/w pre-renal cause, UA no UTI, (+)ketones c/w dehydration. Hematology recs: see lab orders. Pt does not appear to be in heart failure exacerbation at this time based on exam, labs, CXR. VQ study normal. Continuing heparin drip.  08/25: Cr 1.96. HemOnc dx ITP, started IVIG for 5 days. BNP trending down and no CHF exacerbation based on exam, still needing O2, will trial wean off this. C/o blurred vision per RN yesterday evening, CT head negative, cannot perform MRI d/t  pacemaker 08/26: reports blurred vision resolved, he gets these episodes of feeling grainy sensation in eye and then redness. Cancelled MRI. Still low Plt, continuing IVIG and heparin per Hematology, also increasing prednisone. Plt 58. On room air now.  08/27: Plt back to 40. Cr improving 1.62.  08/28: Plt 44. Cr 1.76. BP higher today, changed antihypertensive regimen  08/29: still on IVIG 08/30: Completed 5 days of IVIG.  Heme-onc recommended patient can be discharged on Eliquis 5 mg twice daily. Patient is being discharged home.    Discharge Diagnoses:  Principal Problem:   DVT (deep venous thrombosis) (HCC) Active Problems:   Aortic atherosclerosis (HCC)   Benign essential tremor   Chronic diastolic CHF (congestive heart failure), NYHA class 3 (HCC)   Stage 3b chronic kidney disease (CKD) (HCC)   Essential hypertension   S/P placement of cardiac pacemaker   Sinoatrial node dysfunction (HCC)   Acute kidney injury superimposed on chronic kidney disease stage 3b (HCC)   Gout   Acute ITP (HCC)   Malnutrition of moderate degree   Shortness of breath  DVT (deep venous thrombosis) ([place]) Initiated on Heparin drip  VQ no concern for PE Vascular surgery and hematology following Hematology : Patient can be discharged on Eliquis 5 mg twice daily   Thrombocytopenia (HCC) HemOnc following - c/w ITP -->  dexamethasone 20 mg p.o. daily x 4 days or so --> increased on 08/26 hold platelet transfusion unless platelets keep trending down to 30 or if bleeding IVIG infusion  Continued heparin , transitioned to eliquis. Close monitor CBC Platelets have been stable,  awaiting more significant upward trend and continuing IVIG.   Acute kidney injury superimposed on chronic kidney disease stage 3b (HCC) Prerenal based on FeNa, likely dehydration UA no UTI/casts IMPROVING  Holding nephrotoxic meds Follow BMP   Shortness of breath -resolved Continue O2 --> wean as tolerated --> has been on RA     Chronic diastolic CHF (congestive heart failure), NYHA class 3 (HCC) Etiology HTN / OSA  HFpEF 50-55% Slight elevation BNP in ED but appears fluid compensated, CXR neg home meds (based on last cardiology note 11/2021: isosorbide ER 30 mg, propranolol ER 240 mg qhs, pravastatin 20 mg - pt reports not taking statin) Cont isosorbide added rosuvastatin lowered dose propranolol d/t borderline BP AKI precludes ACE/ARB or diuretics for now  Pt does not appear to be in heart failure exacerbation at this time    Essential hypertension BP elevated, was initially low, restarted most medications other than nephrotoxins   Benign essential tremor On propranolol and primidone at home Primidone held d/t AKI Cont propranolol but caution w/ recent low BP   Gout Holding colchicine d/t AKI   S/P placement of cardiac pacemaker D/t SSS Telemetry for now   Discharge Instructions  Discharge Instructions     Call MD for:  difficulty breathing, headache or visual disturbances   Complete by: As directed    Call MD for:  persistant dizziness or light-headedness   Complete by: As directed    Call MD for:  persistant nausea and vomiting   Complete by: As directed    Diet - low sodium heart healthy   Complete by: As directed    Diet Carb Modified   Complete by: As directed    Discharge instructions   Complete by: As directed    Advised to follow-up with primary care physician in 1 week. Advised to follow-up with Dr. Rogue Bussing to schedule. Advised to take Eliquis 5 mg twice daily for DVT.   Increase activity slowly   Complete by: As directed       Allergies as of 02/18/2022       Reactions   Lipitor [atorvastatin] Other (See Comments)   Muscle pain        Medication List     TAKE these medications    acetaminophen 325 MG tablet Commonly known as: TYLENOL Take 650 mg by mouth every 6 (six) hours as needed for mild pain, moderate pain, fever or headache.   amLODipine 5 MG  tablet Commonly known as: NORVASC Take 5 mg by mouth daily.   apixaban 5 MG Tabs tablet Commonly known as: Eliquis Take 2 tablets ('10mg'$ ) twice daily for 7 days, then 1 tablet ('5mg'$ ) twice daily   aspirin 81 MG tablet Take 81 mg by mouth every other day.   Biotin 1 MG Caps Take 1 mg by mouth daily.   Cholecalciferol 25 MCG (1000 UT) tablet Take 1,000 Units by mouth daily.   colchicine 0.6 MG tablet Take 0.6 mg by mouth daily as needed (Gout).   furosemide 20 MG tablet Commonly known as: LASIX Take 20 mg by mouth daily.   isosorbide mononitrate 30 MG 24 hr tablet Commonly known as: IMDUR Take 30 mg by mouth daily.   omeprazole 40 MG capsule Commonly known as: PRILOSEC Take 40 mg by mouth every other day.   propranolol ER 120 MG 24 hr capsule Commonly known as: INDERAL LA Take 240 mg by mouth daily.   rosuvastatin 10 MG tablet Commonly known as: CRESTOR Take 10 mg  by mouth at bedtime.        Follow-up Information     Rusty Aus, MD. Go in 1 week(s).   Specialty: Internal Medicine Why: Appointment on Wednesday, 02/25/2022 at 3:00pm Contact information: [place] [address] [phone]         [name], MD. Schedule an appointment as soon as possible for a visit in 2 week(s).   Specialties: Internal Medicine, Oncology Why: Please call and make an appointment with Dr. Rogue Bussing for 2 weeks out. Contact information: [place] [address] [phone]                Allergies  Allergen Reactions   Lipitor [Atorvastatin] Other (See Comments)    Muscle pain    Consultations: Oncology   Procedures/Studies: CT HEAD WO CONTRAST (5MM)  Result Date: 02/13/2022 CLINICAL DATA:  Headache, new or worsening. EXAM: CT HEAD WITHOUT CONTRAST TECHNIQUE: Contiguous axial images were obtained from the base of the skull through the vertex without intravenous  contrast. RADIATION DOSE REDUCTION: This exam was performed according to the departmental dose-optimization program which includes automated exposure control, adjustment of the mA and/or kV according to patient size and/or use of iterative reconstruction technique. COMPARISON:  CT head without contrast 12/22/2021 FINDINGS: Brain: No acute infarct, hemorrhage, or mass lesion is present. Mild atrophy and white matter changes are within normal limits for age. Basal ganglia are within normal limits. Insular ribbon is normal. No acute or focal cortical abnormality is present. The ventricles are of normal size. No significant extraaxial fluid collection is present. The brainstem and cerebellum are within normal limits. Vascular: Atherosclerotic calcifications are present within the cavernous internal carotid arteries bilaterally. No hyperdense vessel is present. Skull: Calvarium is intact. No focal lytic or blastic lesions are present. No significant extracranial soft tissue lesion is present. Sinuses/Orbits: The paranasal sinuses and mastoid air cells are clear. Bilateral lens replacements are noted. Globes and orbits are otherwise unremarkable. IMPRESSION: Negative CT of the head for age. Electronically Signed   By: San Morelle M.D.   On: 02/13/2022 18:46   NM Pulmonary Perfusion  Result Date: 02/12/2022 CLINICAL DATA:  Chest pain EXAM: NUCLEAR MEDICINE PERFUSION LUNG SCAN TECHNIQUE: Perfusion images were obtained in multiple projections after intravenous injection of radiopharmaceutical. Ventilation scans intentionally deferred if perfusion scan and chest x-ray adequate for interpretation during COVID 19 epidemic. RADIOPHARMACEUTICALS:  4.4 mCi Tc-41mMAA IV COMPARISON:  Chest radiograph done on 02/11/2022 FINDINGS: There are no segmental or subsegmental wedge-shaped perfusion defects. IMPRESSION: Normal perfusion lung scan. Electronically Signed   By: PElmer PickerM.D.   On: 02/12/2022 13:27   [place] Venous Img Lower Unilateral Left  Result Date: 02/11/2022 CLINICAL DATA:  Swelling and pain EXAM: Left LOWER EXTREMITY VENOUS DOPPLER ULTRASOUND TECHNIQUE: Gray-scale sonography with graded compression, as well as color Doppler and duplex ultrasound were performed to evaluate the lower extremity deep venous systems from the level of the common femoral vein and including the common femoral, femoral, profunda femoral, popliteal and calf veins including the posterior tibial, peroneal and gastrocnemius veins when visible. The superficial great saphenous vein was also interrogated. Spectral Doppler was utilized to evaluate flow at rest and with distal augmentation maneuvers in the common femoral, femoral and popliteal veins. COMPARISON:  None Available. FINDINGS: Contralateral Common Femoral Vein: Respiratory phasicity is normal and symmetric with the symptomatic side. No evidence of thrombus. Normal compressibility. Common Femoral Vein: No  evidence of thrombus. Normal compressibility, respiratory phasicity and response to augmentation. Saphenofemoral Junction: No evidence of thrombus. Normal compressibility and flow on color Doppler imaging. Profunda Femoral Vein: No evidence of thrombus. Normal compressibility and flow on color Doppler imaging. Femoral Vein: No evidence of thrombus. Normal compressibility, respiratory phasicity and response to augmentation. Popliteal Vein: Positive for occlusive thrombus.  Noncompressible. Calf Veins: Positive for occlusive thrombus within the posterior tibial and peroneal veins. Noncompressible. IMPRESSION: Positive for acute occlusive DVT involving the calf veins and popliteal vein. Critical Value/emergent results were called by telephone at the time of interpretation on 02/11/2022 at 5:48 pm to provider [facility] , who verbally acknowledged these results. Electronically Signed   By: Donavan Foil M.D.   On: 02/11/2022 17:48   DG Chest 2 View  Result Date:  02/11/2022 CLINICAL DATA:  Shortness of breath x2 weeks EXAM: CHEST - 2 VIEW COMPARISON:  10/24/2019 FINDINGS: Cardiac size is within normal limits. There are no signs of pulmonary edema or focal pulmonary consolidation. There is no pleural effusion or pneumothorax. There is minimal blunting of right lateral CP angle. Pacemaker battery is seen in the left infraclavicular region. There is no pneumothorax. IMPRESSION: There are no signs of pulmonary edema or focal pulmonary consolidation. Blunting of right lateral CP angle may suggest minimal effusion or pleural thickening. Electronically Signed   By: Elmer Picker M.D.   On: 02/11/2022 13:42     Subjective: Patient was seen and examined at bedside.  Patient has participated in physical therapy.   Patient reports feeling much improved and wants to be discharged.  Patient is being discharged home.  Discharge Exam: Vitals:   02/18/22 0320 02/18/22 0749  BP: (!) 152/95 139/79  Pulse: 64 62  Resp: 17 14  Temp: 97.8 F (36.6 C) (!) 97.5 F (36.4 C)  SpO2: 94% 96%   Vitals:   02/17/22 2306 02/18/22 0320 02/18/22 0500 02/18/22 0749  BP: 131/66 (!) 152/95  139/79  Pulse: (!) 59 64  62  Resp: '18 17  14  '$ Temp: 97.6 F (36.4 C) 97.8 F (36.6 C)  (!) 97.5 F (36.4 C)  TempSrc: Oral Oral  Oral  SpO2: 95% 94%  96%  Weight:   77.3 kg   Height:        General: Pt is alert, awake, not in acute distress Cardiovascular: RRR, S1/S2 +, no rubs, no gallops Respiratory: CTA bilaterally, no wheezing, no rhonchi Abdominal: Soft, NT, ND, bowel sounds + Extremities: no edema, no cyanosis    The results of significant diagnostics from this hospitalization (including imaging, microbiology, ancillary and laboratory) are listed below for reference.     Microbiology: No results found for this or any previous visit (from the past 240 hour(s)).   Labs: BNP (last 3 results) Recent Labs    02/11/22 1255 02/13/22 0535  BNP 414.7* 349.9*   Basic  Metabolic Panel: Recent Labs  Lab 02/15/22 0538 02/16/22 0522 02/16/22 1547 02/17/22 1359 02/18/22 0543  NA 138 135 133* 133* 136  K 4.2 4.1 4.8 3.8 4.0  CL 110 105 105 108 108  CO2 '23 23 24 22 23  '$ GLUCOSE 168* 163* 156* 237* 174*  BUN 59* 55* 61* 58* 55*  CREATININE 1.62* 1.76* 1.69* 1.65* 1.65*  CALCIUM 9.3 9.0 8.8* 8.6* 8.6*   Liver Function Tests: Recent Labs  Lab 02/12/22 0450  AST 24  ALT 20  ALKPHOS 61  BILITOT 1.3*  PROT 5.5*  ALBUMIN 2.8*   No  results for input(s): "LIPASE", "AMYLASE" in the last 168 hours. No results for input(s): "AMMONIA" in the last 168 hours. CBC: Recent Labs  Lab 02/12/22 1111 02/12/22 1414 02/13/22 0535 02/15/22 1607 02/16/22 0522 02/16/22 1547 02/17/22 1359 02/18/22 0543  WBC 7.7 6.6   < > 9.9 8.8 11.1* 11.3* 10.3  NEUTROABS 4.6 5.3  --   --   --   --   --   --   HGB 14.6 13.9   < > 12.2* 12.8* 12.6* 12.9* 13.1  HCT 42.8 41.4   < > 35.5* 36.6* 37.5* 37.7* 38.0*  MCV 93.4 94.1   < > 90.8 91.3 92.4 92.0 92.7  PLT 45* 43*   < > 43* 44* 54* 63* 65*   < > = values in this interval not displayed.   Cardiac Enzymes: No results for input(s): "CKTOTAL", "CKMB", "CKMBINDEX", "TROPONINI" in the last 168 hours. BNP: Invalid input(s): "POCBNP" CBG: Recent Labs  Lab 02/17/22 0726 02/17/22 1122 02/17/22 1455 02/17/22 2137 02/18/22 0612  GLUCAP 153* 391* 255* 213* 244*   D-Dimer No results for input(s): "DDIMER" in the last 72 hours. Hgb A1c No results for input(s): "HGBA1C" in the last 72 hours. Lipid Profile No results for input(s): "CHOL", "HDL", "LDLCALC", "TRIG", "CHOLHDL", "LDLDIRECT" in the last 72 hours. Thyroid function studies No results for input(s): "TSH", "T4TOTAL", "T3FREE", "THYROIDAB" in the last 72 hours.  Invalid input(s): "FREET3" Anemia work up No results for input(s): "VITAMINB12", "FOLATE", "FERRITIN", "TIBC", "IRON", "RETICCTPCT" in the last 72 hours. Urinalysis    Component Value Date/Time    COLORURINE YELLOW (A) 02/12/2022 0940   APPEARANCEUR CLEAR (A) 02/12/2022 0940   LABSPEC 1.020 02/12/2022 0940   PHURINE 5.0 02/12/2022 0940   GLUCOSEU NEGATIVE 02/12/2022 0940   HGBUR NEGATIVE 02/12/2022 0940   BILIRUBINUR NEGATIVE 02/12/2022 0940   KETONESUR 5 (A) 02/12/2022 0940   PROTEINUR NEGATIVE 02/12/2022 0940   NITRITE NEGATIVE 02/12/2022 0940   LEUKOCYTESUR NEGATIVE 02/12/2022 0940   Sepsis Labs Recent Labs  Lab 02/16/22 0522 02/16/22 1547 02/17/22 1359 02/18/22 0543  WBC 8.8 11.1* 11.3* 10.3   Microbiology No results found for this or any previous visit (from the past 240 hour(s)).   Time coordinating discharge: Over 30 minutes  SIGNED:   Shawna Clamp, MD  [facility] 02/18/2022, 5:08 PM Pager   If 7PM-7AM, please contact night-coverage

## 2022-02-18 NOTE — Consult Note (Signed)
ANTICOAGULATION CONSULT NOTE  Pharmacy Consult for IV Heparin Indication: DVT   Patient Measurements: Height: '5\' 9"'$  (175.3 cm) Weight: 77.3 kg (170 lb 6.7 oz) IBW/kg (Calculated) : 70.7 Heparin Dosing Weight: 83.5 kg  Labs: Recent Labs    02/16/22 0522 02/16/22 1547 02/17/22 0513 02/17/22 1359 02/18/22 0543  HGB 12.8* 12.6*  --  12.9* 13.1  HCT 36.6* 37.5*  --  37.7* 38.0*  PLT 44* 54*  --  63* 65*  HEPARINUNFRC 0.41  --  0.40  --  0.35  CREATININE 1.76* 1.69*  --  1.65* 1.65*    Estimated Creatinine Clearance: 33.3 mL/min (A) (by C-G formula based on SCr of 1.65 mg/dL (H)).  Medical History: Past Medical History:  Diagnosis Date   Arthritis    Cancer ([place])    COPD (chronic obstructive pulmonary disease) (HCC)    Hoarseness of voice    Hypertension    MRSA (methicillin resistant staph aureus) culture positive    Sick sinus syndrome (HCC)    Sleep apnea    Tremor     Medications:  Aspirin 81 PTA  Assessment: Patient is a 86 y/o M with medical history as above who presented to the ED 8/23 from PCP office with SOB x 2 weeks. Reported recent diastolic CHF diagnosis and diuretic initiation. Endorsing left foot swelling. Patient subsequently found to have LLE DVT. Pt was found to have thrombocytopenia (PLT 53k) on arrival to ED, per note from hematology low suspicion for HIT. Per hematology recs, ok to continue heparin drip. Pt was started on heparin drip without bolus due to thrombocytopenia. Started on dexamethasone 20 mg daily and IVIG for thrombocytopenia. Patient was not on anticoagulation prior to admission but does take aspirin 81 mg daily. V/Q scan was normal, no evidence of PE. Pharmacy consulted to initiate and manage heparin infusion for VTE treatment.  Goal of Therapy:  Heparin level 0.3 - 0.5 units/ml (no bolus dosing given thrombocytopenia) Monitor platelets by anticoagulation protocol: Yes    Plan: heparin level remains therapeutic 8/30:  HL @ 0543 =  0.35, therapeutic X 5 Will continue pt on current rate and recheck HL on 8/31 with AM labs.  Kristene Liberati D 02/18/2022 7:09 AM

## 2022-02-18 NOTE — Progress Notes (Signed)
Mobility Specialist - Progress Note   02/18/22 1027  Mobility  Activity Ambulated independently in hallway;Stood at bedside;Dangled on edge of bed (Simultaneous filing. User may not have seen previous data.)  Level of Assistance Independent  Assistive Device None  Distance Ambulated (ft) 480 ft  Activity Response Tolerated well  $Mobility charge 1 Mobility   Pt semi-supine on RA upon arrival. Pt STS and ambulates 3 laps around NS indep. Pt returns to bed with needs in reach.   Gretchen Short  Mobility Specialist  02/18/22 10:29 AM

## 2022-02-18 NOTE — Telephone Encounter (Signed)
Pharmacy Patient Advocate Encounter  Insurance verification completed.    The patient is insured through [employer] Medicare Part D   The patient is currently admitted and ran test claims for the following: Eliquis.  Copays and coinsurance results were relayed to Inpatient clinical team.      

## 2022-02-18 NOTE — Progress Notes (Signed)
  Darren Gonzales   DOB:04-Mar-1936   QA#:[id number]    Subjective: No worsening headaches. Patient denies any nausea vomiting.    Denies any bleeding gums or nose.    Pain in the leg improved.  Continues to complain of shortness of breath on exertion.  Patient is alone.   Objective:  Vitals:   02/18/22 0320 02/18/22 0749  BP: (!) 152/95 139/79  Pulse: 64 62  Resp: 17 14  Temp: 97.8 F (36.6 C) (!) 97.5 F (36.4 C)  SpO2: 94% 96%     Intake/Output Summary (Last 24 hours) at 02/18/2022 0910 Last data filed at 02/18/2022 0800 Gross per 24 hour  Intake 240 ml  Output 750 ml  Net -510 ml  Patient currently on 2 L of nasal cannula oxygen.  Physical Exam Vitals and nursing note reviewed.  HENT:     Head: Normocephalic and atraumatic.     Mouth/Throat:     Pharynx: Oropharynx is clear.  Eyes:     Extraocular Movements: Extraocular movements intact.     Pupils: Pupils are equal, round, and reactive to light.  Cardiovascular:     Rate and Rhythm: Normal rate and regular rhythm.  Pulmonary:     Comments: Decreased breath sounds bilaterally.  Abdominal:     Palpations: Abdomen is soft.  Musculoskeletal:        General: Normal range of motion.     Cervical back: Normal range of motion.  Skin:    General: Skin is warm.  Neurological:     General: No focal deficit present.     Mental Status: He is alert and oriented to person, place, and time.  Psychiatric:        Behavior: Behavior normal.        Judgment: Judgment normal.      Labs:  Lab Results  Component Value Date   WBC 10.3 02/18/2022   HGB 13.1 02/18/2022   HCT 38.0 (L) 02/18/2022   MCV 92.7 02/18/2022   PLT 65 (L) 02/18/2022   NEUTROABS 5.3 02/12/2022    Lab Results  Component Value Date   NA 136 02/18/2022   K 4.0 02/18/2022   CL 108 02/18/2022   CO2 23 02/18/2022    Studies:  No results found.  86 year old male patient with multiple medical problems including CAD/CHF chronic kidney disease stage  IV is currently admitted to hospital for right lower extremity DVT; incidentally noted to have platelets of 35 on admission.   # Severe thrombocytopenia-platelets 35-40s-etiology is unclear however clinically suspicious of ITP.  Platelets today 6365.  Platelets improving slowly but steadily.  Patient status post dexamethasone for 5 days; also s/p IVIG infusion 400 mg per metered square per day STARTED on 8/25. Currently s/p dose #5/5. I think it is reasonable to discharge patient home on Eliquis 5 mg twice daily [given renal dysfunction]  Discussed with Dr. Dwyane Dee.    # Acute DVT of the LEFT lower extremity-continue IV heparin without bolus-given acute thrombocytopenia. VQ scan-NEG. See above re: transitioning to [place].    #CKD-stage IV stable.  I will have the patient follow-up in the clinic early next week-lab check; possible N-plate.    [name], MD 02/18/2022  9:10 AM

## 2022-02-18 NOTE — Discharge Instructions (Signed)
Advised to follow-up with primary care physician in 1 week. Advised to follow-up with Dr. Rogue Bussing to schedule. Advised to take Eliquis 5 mg twice daily for DVT.

## 2022-02-18 NOTE — TOC CM/SW Note (Signed)
Patient has orders to discharge home today. Chart reviewed. PCP is Emily Filbert, MD. Weaned to room air. No wounds. Pharmacy will give Eliquis coupon. No TOC needs identified. CSW signing off.  Darren Gonzales, [place]

## 2022-02-18 NOTE — Inpatient Diabetes Management (Signed)
Inpatient Diabetes Program Recommendations  AACE/ADA: New Consensus Statement on Inpatient Glycemic Control (2015)  Target Ranges:  Prepandial:   less than 140 mg/dL      Peak postprandial:   less than 180 mg/dL (1-2 hours)      Critically ill patients:  140 - 180 mg/dL   Lab Results  Component Value Date   GLUCAP 244 (H) 02/18/2022   HGBA1C 6.0 (H) 02/11/2022    Review of Glycemic Control  Latest Reference Range & Units 02/17/22 07:26 02/17/22 11:22 02/17/22 14:55 02/17/22 21:37 02/18/22 06:12  Glucose-Capillary 70 - 99 mg/dL 153 (H) 391 (H) 255 (H) 213 (H) 244 (H)  (H): Data is abnormally high  Diabetes history: No Outpatient Diabetes medications: NA Current orders for Inpatient glycemic control: None; Decadron 40 mg daily  Inpatient Diabetes Program Recommendations:     While ordered steroids, please consider ordering CBGs AC&HS and Novolog 0-9 units AC&HS.   Will continue to follow while inpatient.  Thank you, Reche Dixon, MSN, [place] Diabetes Coordinator Inpatient Diabetes Program [phone] (team pager from 8a-5p)

## 2022-02-18 NOTE — Progress Notes (Signed)
M-please have the patient follow-up with APP- Tuesday-9/05 -lab-CBC/BMP- possible N- plate.  Thanks GB

## 2022-02-24 ENCOUNTER — Inpatient Hospital Stay: Payer: HMO

## 2022-02-24 ENCOUNTER — Inpatient Hospital Stay: Payer: HMO | Attending: Nurse Practitioner | Admitting: Nurse Practitioner

## 2022-02-24 ENCOUNTER — Other Ambulatory Visit: Payer: Self-pay

## 2022-02-24 VITALS — BP 113/68 | HR 66 | Temp 96.0°F | Resp 16 | Wt 165.4 lb

## 2022-02-24 DIAGNOSIS — I824Z9 Acute embolism and thrombosis of unspecified deep veins of unspecified distal lower extremity: Secondary | ICD-10-CM

## 2022-02-24 DIAGNOSIS — Z7901 Long term (current) use of anticoagulants: Secondary | ICD-10-CM | POA: Diagnosis not present

## 2022-02-24 DIAGNOSIS — Z79899 Other long term (current) drug therapy: Secondary | ICD-10-CM | POA: Diagnosis not present

## 2022-02-24 DIAGNOSIS — D696 Thrombocytopenia, unspecified: Secondary | ICD-10-CM | POA: Insufficient documentation

## 2022-02-24 DIAGNOSIS — D693 Immune thrombocytopenic purpura: Secondary | ICD-10-CM

## 2022-02-24 DIAGNOSIS — N184 Chronic kidney disease, stage 4 (severe): Secondary | ICD-10-CM | POA: Diagnosis not present

## 2022-02-24 LAB — BASIC METABOLIC PANEL
Anion gap: 9 (ref 5–15)
BUN: 59 mg/dL — ABNORMAL HIGH (ref 8–23)
CO2: 26 mmol/L (ref 22–32)
Calcium: 8.1 mg/dL — ABNORMAL LOW (ref 8.9–10.3)
Chloride: 104 mmol/L (ref 98–111)
Creatinine, Ser: 1.87 mg/dL — ABNORMAL HIGH (ref 0.61–1.24)
GFR, Estimated: 35 mL/min — ABNORMAL LOW (ref >60)
Glucose, Bld: 96 mg/dL (ref 70–99)
Potassium: 4.1 mmol/L (ref 3.5–5.1)
Sodium: 139 mmol/L (ref 135–145)

## 2022-02-24 LAB — CBC WITH DIFFERENTIAL/PLATELET
Abs Immature Granulocytes: 0.09 10*3/uL — ABNORMAL HIGH (ref 0.00–0.07)
Basophils Absolute: 0 10*3/uL (ref 0.0–0.1)
Basophils Relative: 0 %
Eosinophils Absolute: 0.1 10*3/uL (ref 0.0–0.5)
Eosinophils Relative: 1 %
HCT: 46.4 % (ref 39.0–52.0)
Hemoglobin: 15.3 g/dL (ref 13.0–17.0)
Immature Granulocytes: 1 %
Lymphocytes Relative: 10 %
Lymphs Abs: 1 10*3/uL (ref 0.7–4.0)
MCH: 31.6 pg (ref 26.0–34.0)
MCHC: 33 g/dL (ref 30.0–36.0)
MCV: 95.9 fL (ref 80.0–100.0)
Monocytes Absolute: 1.1 10*3/uL — ABNORMAL HIGH (ref 0.1–1.0)
Monocytes Relative: 11 %
Neutro Abs: 7.6 10*3/uL (ref 1.7–7.7)
Neutrophils Relative %: 77 %
Platelets: 100 10*3/uL — ABNORMAL LOW (ref 150–400)
RBC: 4.84 MIL/uL (ref 4.22–5.81)
RDW: 13.5 % (ref 11.5–15.5)
WBC: 9.9 10*3/uL (ref 4.0–10.5)
nRBC: 0 % (ref 0.0–0.2)

## 2022-02-24 NOTE — Progress Notes (Deleted)
New pt/hospital follow-up.

## 2022-02-24 NOTE — Progress Notes (Unsigned)
[facility] of the [facility] [address], [address] [place], [place] [address] [phone] (phone) [phone] (fax)  Clinic Day:  02/24/2022  Referring physician: Miller, Mark F, MD  Chief Complaints: Thrombocytopenia & DVT follow up  History of Presenting Illness: Darren Gonzales presented is 85-year-old male with multiple medical problems including CAD/CHF, chronic kidney disease stage IV.  He was admitted to the hospital for left lower extremity DVT in August 2023.  Ultrasound showed positive acute occlusive DVT in involving the calf veins and popliteal vein.  He received IV heparin without bolus.  Was incidentally noted to have platelets of 35.  Hemoglobin 14.  White count was normal.  Etiology was unclear however suspicious for ITP.  He received dexamethasone 20 mg without improvement, increased to 40 mg. Received IVIG 400 mg/m2 x 5 days.   DVT- u/s occlusive DVT of left calf veins and popliteal. Received heparin then transitioned to eliquis 5 mg BID.   Had a 2-week history of shortness of breath and orthopnea.  VQ scan was performed due to chronic insufficiency which was negative for PE. Etiology thought to be secondary to cardiac/pulmonary cause.   Prior known history of thrombocytopenia-chronic.  Seems to date back to 2020 however, more recently in August 2023 platelets in the 30s.    Takes a baby aspirin.  He presents today for for hospital follow-up.    Review of Systems - Oncology   Past Medical History:  Diagnosis Date   Arthritis    Cancer (HCC)    COPD (chronic obstructive pulmonary disease) (HCC)    Hoarseness of voice    Hypertension    MRSA (methicillin resistant staph aureus) culture positive    Sick sinus syndrome (HCC)    Sleep apnea    Tremor     Past Surgical History:  Procedure Laterality Date   CATARACT EXTRACTION     COLONOSCOPY WITH PROPOFOL N/A 01/15/2016   Procedure:  COLONOSCOPY WITH PROPOFOL;  Surgeon: Robert T Elliott, MD;  Location: [facility] ENDOSCOPY;  Service: Endoscopy;  Laterality: N/A;   ESOPHAGOGASTRODUODENOSCOPY (EGD) WITH PROPOFOL N/A 01/15/2016   Procedure: ESOPHAGOGASTRODUODENOSCOPY (EGD) WITH PROPOFOL;  Surgeon: Robert T Elliott, MD;  Location: [facility] ENDOSCOPY;  Service: Endoscopy;  Laterality: N/A;   INSERT / REPLACE / REMOVE PACEMAKER     POSTERIOR LAMINECTOMY / DECOMPRESSION LUMBAR SPINE     PPM GENERATOR CHANGEOUT N/A 08/07/2021   Procedure: PPM GENERATOR CHANGEOUT;  Surgeon: Paraschos, Alexander, MD;  Location: [facility] INVASIVE CV LAB;  Service: Cardiovascular;  Laterality: N/A;    No family history on file.   reports that he has quit smoking. He has never used smokeless tobacco. He reports that he does not drink alcohol and does not use drugs.  Allergies  Allergen Reactions   Lipitor [Atorvastatin] Other (See Comments)    Muscle pain    Current Outpatient Medications  Medication Sig Dispense Refill   acetaminophen (TYLENOL) 325 MG tablet Take 650 mg by mouth every 6 (six) hours as needed for mild pain, moderate pain, fever or headache.     amLODipine (NORVASC) 5 MG tablet Take 5 mg by mouth daily.     apixaban (ELIQUIS) 5 MG TABS tablet Take 2 tablets (10mg) twice daily for 7 days, then 1 tablet (5mg) twice daily 60 tablet 3   aspirin 81 MG tablet Take 81 mg by mouth every other day.      Biotin 1 MG CAPS Take 1   mg by mouth daily.     Cholecalciferol 1000 units tablet Take 1,000 Units by mouth daily.     colchicine 0.6 MG tablet Take 0.6 mg by mouth daily as needed (Gout).     furosemide (LASIX) 20 MG tablet Take 20 mg by mouth daily.     isosorbide mononitrate (IMDUR) 30 MG 24 hr tablet Take 30 mg by mouth daily.     omeprazole (PRILOSEC) 40 MG capsule Take 40 mg by mouth every other day.     propranolol ER (INDERAL LA) 120 MG 24 hr capsule Take 240 mg by mouth daily.     rosuvastatin (CRESTOR) 10 MG tablet Take 10 mg by mouth at  bedtime.     No current facility-administered medications for this visit.    Objective:  Blood pressure 113/68, pulse 66, temperature (!) 96 F (35.6 C), temperature source Tympanic, resp. rate 16, weight 165 lb 7 oz (75 kg), SpO2 97 %.  Wt Readings from Last 3 Encounters:  02/24/22 165 lb 7 oz (75 kg)  02/18/22 170 lb 6.7 oz (77.3 kg)  08/07/21 184 lb (83.5 kg)    Body mass index is 24.43 kg/m.  Performance status (ECOG): 2 - Symptomatic, <50% confined to bed Physical Exam Constitutional:      Appearance: He is not ill-appearing.     Comments: Accompanied by daughter in law  HENT:     Head: Normocephalic.  Eyes:     Conjunctiva/sclera: Conjunctivae normal.  Cardiovascular:     Rate and Rhythm: Normal rate and regular rhythm.  Pulmonary:     Effort: Pulmonary effort is normal.     Comments: Diminished bilaterally Abdominal:     General: There is no distension.     Palpations: Abdomen is soft.     Tenderness: There is no abdominal tenderness. There is no guarding.  Musculoskeletal:     Right lower leg: No edema.     Left lower leg: Edema present.  Skin:    Coloration: Skin is not pale.     Findings: Bruising present. No rash.  Neurological:     Mental Status: He is alert and oriented to person, place, and time.  Psychiatric:        Mood and Affect: Mood normal.        Behavior: Behavior normal.         Latest Ref Rng & Units 02/18/2022    5:43 AM 02/17/2022    1:59 PM 02/16/2022    3:47 PM  CBC  WBC 4.0 - 10.5 K/uL 10.3  11.3  11.1   Hemoglobin 13.0 - 17.0 g/dL 13.1  12.9  12.6   Hematocrit 39.0 - 52.0 % 38.0  37.7  37.5   Platelets 150 - 400 K/uL 65  63  54       Latest Ref Rng & Units 02/18/2022    5:43 AM 02/17/2022    1:59 PM 02/16/2022    3:47 PM  CMP  Glucose 70 - 99 mg/dL 174  237  156   BUN 8 - 23 mg/dL 55  58  61   Creatinine 0.61 - 1.24 mg/dL 1.65  1.65  1.69   Sodium 135 - 145 mmol/L 136  133  133   Potassium 3.5 - 5.1 mmol/L 4.0  3.8  4.8    Chloride 98 - 111 mmol/L 108  108  105   CO2 22 - 32 mmol/L _0 Calcium 8.9 - 10.3 mg/dL  8.6  8.6  8.8    Lab Results  Component Value Date   LDH 528 (H) 02/14/2022   LDH 565 (H) 02/12/2022    CT HEAD WO CONTRAST (5MM)  Result Date: 02/13/2022 CLINICAL DATA:  Headache, new or worsening. EXAM: CT HEAD WITHOUT CONTRAST TECHNIQUE: Contiguous axial images were obtained from the base of the skull through the vertex without intravenous contrast. RADIATION DOSE REDUCTION: This exam was performed according to the departmental dose-optimization program which includes automated exposure control, adjustment of the mA and/or kV according to patient size and/or use of iterative reconstruction technique. COMPARISON:  CT head without contrast 12/22/2021 FINDINGS: Brain: No acute infarct, hemorrhage, or mass lesion is present. Mild atrophy and white matter changes are within normal limits for age. Basal ganglia are within normal limits. Insular ribbon is normal. No acute or focal cortical abnormality is present. The ventricles are of normal size. No significant extraaxial fluid collection is present. The brainstem and cerebellum are within normal limits. Vascular: Atherosclerotic calcifications are present within the cavernous internal carotid arteries bilaterally. No hyperdense vessel is present. Skull: Calvarium is intact. No focal lytic or blastic lesions are present. No significant extracranial soft tissue lesion is present. Sinuses/Orbits: The paranasal sinuses and mastoid air cells are clear. Bilateral lens replacements are noted. Globes and orbits are otherwise unremarkable. IMPRESSION: Negative CT of the head for age. Electronically Signed   By: San Morelle M.D.   On: 02/13/2022 18:46   NM Pulmonary Perfusion  Result Date: 02/12/2022 CLINICAL DATA:  Chest pain EXAM: NUCLEAR MEDICINE PERFUSION LUNG SCAN TECHNIQUE: Perfusion images were obtained in multiple projections after intravenous  injection of radiopharmaceutical. Ventilation scans intentionally deferred if perfusion scan and chest x-ray adequate for interpretation during COVID 19 epidemic. RADIOPHARMACEUTICALS:  4.4 mCi Tc-79mMAA IV COMPARISON:  Chest radiograph done on 02/11/2022 FINDINGS: There are no segmental or subsegmental wedge-shaped perfusion defects. IMPRESSION: Normal perfusion lung scan. Electronically Signed   By: PElmer PickerM.D.   On: 02/12/2022 13:27   UKoreaVenous Img Lower Unilateral Left  Result Date: 02/11/2022 CLINICAL DATA:  Swelling and pain EXAM: Left LOWER EXTREMITY VENOUS DOPPLER ULTRASOUND TECHNIQUE: Gray-scale sonography with graded compression, as well as color Doppler and duplex ultrasound were performed to evaluate the lower extremity deep venous systems from the level of the common femoral vein and including the common femoral, femoral, profunda femoral, popliteal and calf veins including the posterior tibial, peroneal and gastrocnemius veins when visible. The superficial great saphenous vein was also interrogated. Spectral Doppler was utilized to evaluate flow at rest and with distal augmentation maneuvers in the common femoral, femoral and popliteal veins. COMPARISON:  None Available. FINDINGS: Contralateral Common Femoral Vein: Respiratory phasicity is normal and symmetric with the symptomatic side. No evidence of thrombus. Normal compressibility. Common Femoral Vein: No evidence of thrombus. Normal compressibility, respiratory phasicity and response to augmentation. Saphenofemoral Junction: No evidence of thrombus. Normal compressibility and flow on color Doppler imaging. Profunda Femoral Vein: No evidence of thrombus. Normal compressibility and flow on color Doppler imaging. Femoral Vein: No evidence of thrombus. Normal compressibility, respiratory phasicity and response to augmentation. Popliteal Vein: Positive for occlusive thrombus.  Noncompressible. Calf Veins: Positive for occlusive  thrombus within the posterior tibial and peroneal veins. Noncompressible. IMPRESSION: Positive for acute occlusive DVT involving the calf veins and popliteal vein. Critical Value/emergent results were called by telephone at the time of interpretation on 02/11/2022 at 5:48 pm to provider [facility], who verbally acknowledged these results.  Electronically Signed   By: Kim  Fujinaga M.D.   On: 02/11/2022 17:48   DG Chest 2 View  Result Date: 02/11/2022 CLINICAL DATA:  Shortness of breath x2 weeks EXAM: CHEST - 2 VIEW COMPARISON:  10/24/2019 FINDINGS: Cardiac size is within normal limits. There are no signs of pulmonary edema or focal pulmonary consolidation. There is no pleural effusion or pneumothorax. There is minimal blunting of right lateral CP angle. Pacemaker battery is seen in the left infraclavicular region. There is no pneumothorax. IMPRESSION: There are no signs of pulmonary edema or focal pulmonary consolidation. Blunting of right lateral CP angle may suggest minimal effusion or pleural thickening. Electronically Signed   By: Palani  Rathinasamy M.D.   On: 02/11/2022 13:42      Assessment & Plan:  Darren Gonzales is a 86 y.o. male who presents to clinic for hospital follow up   Severe Thrombocytopenia- platelets 35-40s. Thrombocytopenia appears to date back to 2020 but worse since August 2023. Etiology unclear but was clinically suspicious for ITP. We again reviewed that thrombocytopenia can result from multiple etiologies including but not limited to liver disease, medications, autoimmune disease-ITP, etc. And that ITP is a diagnosis of exclusion. He received dexamethasone x 5 days and IVIG infusion 400 mg/m2 x 5. Platelets improved to 65 at discharge. Given that he has responded to steroids and IVIG, ITP is more likely than other etiologies. LDH was elevated but improved, hepatitis b workup was negative. Smear was negative for schiztocytes. No history of cirrhosis or splenomegaly but could  consider ultrasound to evaluate further. Today, platelets continue to improve, now 100. Hold off on bone marrow biopsy at this time. We discussed that there may be a role for starting Nplate in the future if platelets drop to < 50. Reviewed that romiplostim is an injection that stimulates the bone marrow to produce more platelets.  Acute DVT of left lower extremity- s/p IV heparin w/o bolus d/t thrombocytopenia. VQ scan (d/t insufficiency) was negative for PE. He transitioned to eliquis 5 mg twice daily d/t renal dysfunction. Some mild persistent symptoms but improving. Reviewed bleeding precautions in setting of blood thinner and with thrombocytopenia however benefit outweighs risk currently given improvement in his platelets. Encouraged compliance with medication. Cautioned against falling, heavy machinery use, etc.  CKD- experienced AKI on CKD stage 3b during hospitalization. Improved with gentle hydration. Today, GFR is 35. Avoid nephrotoxic substances and gentle hydration. Not currently followed by nephrology. PCP has been managing.  Chronic Diastolic Heart failure- preserved EF of 50-55%.  SSS- pacemaker OSA- intolerant to cpap COPD- chest xray in hospital was negartive for consolidation or edema. PCP managing copd.  SOB- suspect cardiac or pulmonary etiology. No hypoxia with ambulation today in clinic. Recommended he reach out to his cardiologist.   Follow up plan reviewed with Dr. Brahmanday.   Disposition: 2 weeks- lab, +/- Nplate 4 weeks- lab & Dr. Brahmanday, possible NPlate- la  I discussed the assessment and treatment plan with the patient.  The patient was provided an opportunity to ask questions and all were answered.  The patient agreed with the plan and demonstrated an understanding of the instructions.  The patient was advised to call back if the symptoms worsen or if the condition fails to improve as anticipated.  Thank you for the opportunity to participate in the care of this  very pleasant patient  Lauren Allen, DNP, AGNP-C [facility] [place]   [phone] (clinic)  

## 2022-02-25 ENCOUNTER — Encounter: Payer: Self-pay | Admitting: Internal Medicine

## 2022-02-25 ENCOUNTER — Other Ambulatory Visit: Payer: Self-pay

## 2022-02-25 ENCOUNTER — Other Ambulatory Visit: Payer: Self-pay | Admitting: Internal Medicine

## 2022-02-25 DIAGNOSIS — D693 Immune thrombocytopenic purpura: Secondary | ICD-10-CM | POA: Diagnosis not present

## 2022-02-25 DIAGNOSIS — R918 Other nonspecific abnormal finding of lung field: Secondary | ICD-10-CM | POA: Diagnosis not present

## 2022-02-25 DIAGNOSIS — R0609 Other forms of dyspnea: Secondary | ICD-10-CM

## 2022-02-25 DIAGNOSIS — I824Z9 Acute embolism and thrombosis of unspecified deep veins of unspecified distal lower extremity: Secondary | ICD-10-CM

## 2022-02-25 DIAGNOSIS — I82402 Acute embolism and thrombosis of unspecified deep veins of left lower extremity: Secondary | ICD-10-CM | POA: Diagnosis not present

## 2022-02-25 DIAGNOSIS — N1831 Chronic kidney disease, stage 3a: Secondary | ICD-10-CM | POA: Diagnosis not present

## 2022-02-25 DIAGNOSIS — R911 Solitary pulmonary nodule: Secondary | ICD-10-CM

## 2022-02-26 ENCOUNTER — Ambulatory Visit
Admission: RE | Admit: 2022-02-26 | Discharge: 2022-02-26 | Disposition: A | Discharge status: Home / Self Care | Payer: HMO | Source: Ambulatory Visit | Attending: Internal Medicine | Admitting: Internal Medicine

## 2022-02-26 DIAGNOSIS — R0609 Other forms of dyspnea: Secondary | ICD-10-CM | POA: Diagnosis not present

## 2022-02-26 DIAGNOSIS — R0602 Shortness of breath: Secondary | ICD-10-CM | POA: Diagnosis not present

## 2022-02-26 DIAGNOSIS — R911 Solitary pulmonary nodule: Secondary | ICD-10-CM | POA: Insufficient documentation

## 2022-03-05 DIAGNOSIS — H6123 Impacted cerumen, bilateral: Secondary | ICD-10-CM | POA: Diagnosis not present

## 2022-03-05 DIAGNOSIS — H903 Sensorineural hearing loss, bilateral: Secondary | ICD-10-CM | POA: Diagnosis not present

## 2022-03-10 ENCOUNTER — Other Ambulatory Visit: Payer: HMO

## 2022-03-10 ENCOUNTER — Inpatient Hospital Stay: Payer: HMO

## 2022-03-10 DIAGNOSIS — I824Z9 Acute embolism and thrombosis of unspecified deep veins of unspecified distal lower extremity: Secondary | ICD-10-CM

## 2022-03-10 DIAGNOSIS — D696 Thrombocytopenia, unspecified: Secondary | ICD-10-CM | POA: Diagnosis not present

## 2022-03-10 LAB — CBC WITH DIFFERENTIAL/PLATELET
Abs Immature Granulocytes: 0.11 10*3/uL — ABNORMAL HIGH (ref 0.00–0.07)
Basophils Absolute: 0.1 10*3/uL (ref 0.0–0.1)
Basophils Relative: 1 %
Eosinophils Absolute: 0.6 10*3/uL — ABNORMAL HIGH (ref 0.0–0.5)
Eosinophils Relative: 10 %
HCT: 42.6 % (ref 39.0–52.0)
Hemoglobin: 14 g/dL (ref 13.0–17.0)
Immature Granulocytes: 2 %
Lymphocytes Relative: 19 %
Lymphs Abs: 1.2 10*3/uL (ref 0.7–4.0)
MCH: 31.3 pg (ref 26.0–34.0)
MCHC: 32.9 g/dL (ref 30.0–36.0)
MCV: 95.3 fL (ref 80.0–100.0)
Monocytes Absolute: 0.6 10*3/uL (ref 0.1–1.0)
Monocytes Relative: 9 %
Neutro Abs: 3.7 10*3/uL (ref 1.7–7.7)
Neutrophils Relative %: 59 %
Platelets: 214 10*3/uL (ref 150–400)
RBC: 4.47 MIL/uL (ref 4.22–5.81)
RDW: 13.9 % (ref 11.5–15.5)
WBC: 6.3 10*3/uL (ref 4.0–10.5)
nRBC: 0.3 % — ABNORMAL HIGH (ref 0.0–0.2)

## 2022-03-10 LAB — BASIC METABOLIC PANEL
Anion gap: 4 — ABNORMAL LOW (ref 5–15)
BUN: 48 mg/dL — ABNORMAL HIGH (ref 8–23)
CO2: 27 mmol/L (ref 22–32)
Calcium: 8.8 mg/dL — ABNORMAL LOW (ref 8.9–10.3)
Chloride: 108 mmol/L (ref 98–111)
Creatinine, Ser: 2.09 mg/dL — ABNORMAL HIGH (ref 0.61–1.24)
GFR, Estimated: 31 mL/min — ABNORMAL LOW (ref >60)
Glucose, Bld: 103 mg/dL — ABNORMAL HIGH (ref 70–99)
Potassium: 3.5 mmol/L (ref 3.5–5.1)
Sodium: 139 mmol/L (ref 135–145)

## 2022-03-30 ENCOUNTER — Inpatient Hospital Stay: Payer: HMO | Admitting: Internal Medicine

## 2022-03-30 ENCOUNTER — Inpatient Hospital Stay: Payer: HMO

## 2022-03-30 ENCOUNTER — Inpatient Hospital Stay: Payer: HMO | Attending: Nurse Practitioner

## 2022-03-30 VITALS — BP 103/68 | HR 68 | Temp 96.0°F | Resp 16 | Wt 164.2 lb

## 2022-03-30 DIAGNOSIS — N184 Chronic kidney disease, stage 4 (severe): Secondary | ICD-10-CM | POA: Insufficient documentation

## 2022-03-30 DIAGNOSIS — Z7901 Long term (current) use of anticoagulants: Secondary | ICD-10-CM | POA: Insufficient documentation

## 2022-03-30 DIAGNOSIS — Z79899 Other long term (current) drug therapy: Secondary | ICD-10-CM | POA: Diagnosis not present

## 2022-03-30 DIAGNOSIS — D693 Immune thrombocytopenic purpura: Secondary | ICD-10-CM | POA: Diagnosis not present

## 2022-03-30 DIAGNOSIS — Z86718 Personal history of other venous thrombosis and embolism: Secondary | ICD-10-CM | POA: Diagnosis not present

## 2022-03-30 DIAGNOSIS — I824Z9 Acute embolism and thrombosis of unspecified deep veins of unspecified distal lower extremity: Secondary | ICD-10-CM

## 2022-03-30 DIAGNOSIS — I82462 Acute embolism and thrombosis of left calf muscular vein: Secondary | ICD-10-CM | POA: Diagnosis not present

## 2022-03-30 LAB — CBC WITH DIFFERENTIAL/PLATELET
Abs Immature Granulocytes: 0.03 10*3/uL (ref 0.00–0.07)
Basophils Absolute: 0.1 10*3/uL (ref 0.0–0.1)
Basophils Relative: 1 %
Eosinophils Absolute: 0.3 10*3/uL (ref 0.0–0.5)
Eosinophils Relative: 3 %
HCT: 39.3 % (ref 39.0–52.0)
Hemoglobin: 13.2 g/dL (ref 13.0–17.0)
Immature Granulocytes: 0 %
Lymphocytes Relative: 18 %
Lymphs Abs: 1.4 10*3/uL (ref 0.7–4.0)
MCH: 32 pg (ref 26.0–34.0)
MCHC: 33.6 g/dL (ref 30.0–36.0)
MCV: 95.4 fL (ref 80.0–100.0)
Monocytes Absolute: 0.6 10*3/uL (ref 0.1–1.0)
Monocytes Relative: 8 %
Neutro Abs: 5.3 10*3/uL (ref 1.7–7.7)
Neutrophils Relative %: 70 %
Platelets: 164 10*3/uL (ref 150–400)
RBC: 4.12 MIL/uL — ABNORMAL LOW (ref 4.22–5.81)
RDW: 13.7 % (ref 11.5–15.5)
WBC: 7.6 10*3/uL (ref 4.0–10.5)
nRBC: 0 % (ref 0.0–0.2)

## 2022-03-30 LAB — BASIC METABOLIC PANEL
Anion gap: 6 (ref 5–15)
BUN: 27 mg/dL — ABNORMAL HIGH (ref 8–23)
CO2: 26 mmol/L (ref 22–32)
Calcium: 8.7 mg/dL — ABNORMAL LOW (ref 8.9–10.3)
Chloride: 108 mmol/L (ref 98–111)
Creatinine, Ser: 1.7 mg/dL — ABNORMAL HIGH (ref 0.61–1.24)
GFR, Estimated: 39 mL/min — ABNORMAL LOW (ref >60)
Glucose, Bld: 120 mg/dL — ABNORMAL HIGH (ref 70–99)
Potassium: 4 mmol/L (ref 3.5–5.1)
Sodium: 140 mmol/L (ref 135–145)

## 2022-03-30 NOTE — Progress Notes (Signed)
Still having SOBr with new cough for the past 3 days.

## 2022-03-30 NOTE — Progress Notes (Signed)
[facility] NOTE  Patient Care Team: Rusty Aus, MD as PCP - General (Internal Medicine)  CHIEF COMPLAINTS/PURPOSE OF CONSULTATION: ITP  # Acute ITP [AUG 2023-]- hepatitis b workup was negative. Smear was negative for schiztocytes. No history of cirrhosis or splenomegaly but could consider ultrasound to evaluate further. Today, platelets continue to improve, now 100. Hold off on bone marrow biopsy at this time. dexamethasone 20 mg without improvement, increased to 40 mg. Received IVIG 400 mg/m2 x 5 days.   # Acute left lower extremity DVT in August 2023- on eliquis. VQ scan was performed due to chronic insufficiency which was negative for PE; eliquis 5 mg BID.   # CAD/CHF, chronic kidney disease stage IV.  SEP 2023- CT [non-contrast- Dr.Miller]  1. Mild pulmonary fibrosis in a pattern with apical to basal gradient, featuring irregular peripheral interstitial opacity, a preponderance of ground-glass, and subtle subpleural sparing at the lung bases. Fibrotic findings are significantly worsened compared to prior examination dated 10/24/2019. Findings are suggestive of an alternative diagnosis (not UIP) per consensus guidelines, leading differential consideration NSIP: Diagnosis of Idiopathic Pulmonary Fibrosis: An Official ATS/ERS/JRS/ALAT Clinical Practice Guideline. [place], Iss 5, [phone], Feb 20 2017. 2. Small subpleural consolidative opacities, particularly in the peripheral right lower lobe, with some evidence of central clearing. This morphology carries a wide differential, generally including infectious/inflammatory etiology such as organizing pneumonia and fungal infection, although small acute pulmonary infarction secondary to pulmonary embolus can also have this appearance, particularly in subpleural locations. Consider CT PE angiogram to exclude pulmonary embolus if there is clinical concern based on acute signs and  symptoms. 3. Stable, definitively benign 0.3 cm nodule of the peripheral left upper lobe    HISTORY OF PRESENTING ILLNESS:  Darren Gonzales 86 y.o.  male ITP is here for follow-up.  As detailed summarized above patient was treated with steroids and IVIG in the hospital in August 2023.  Patient also has history of acute DVT currently on Eliquis.  Patient here for follow-up.  Patient denies any bleeding problems.  Denies any falls.   Patient admits to improvement of his shortness of breath.  Intermittent pain in his left calf/thigh.  Otherwise no blood in stools or black or stools.   Review of Systems  Constitutional:  Negative for chills, diaphoresis, fever, malaise/fatigue and weight loss.  HENT:  Negative for nosebleeds and sore throat.   Eyes:  Negative for double vision.  Respiratory:  Positive for shortness of breath. Negative for cough, hemoptysis, sputum production and wheezing.   Cardiovascular:  Negative for chest pain, palpitations, orthopnea and leg swelling.  Gastrointestinal:  Negative for abdominal pain, blood in stool, constipation, diarrhea, heartburn, melena, nausea and vomiting.  Genitourinary:  Negative for dysuria, frequency and urgency.  Musculoskeletal:  Positive for back pain, joint pain and myalgias.  Skin: Negative.  Negative for itching and rash.  Neurological:  Negative for dizziness, tingling, focal weakness, weakness and headaches.  Endo/Heme/Allergies:  Does not bruise/bleed easily.  Psychiatric/Behavioral:  Negative for depression. The patient is not nervous/anxious and does not have insomnia.     MEDICAL HISTORY:  Past Medical History:  Diagnosis Date   Arthritis    Cancer ([place])    COPD (chronic obstructive pulmonary disease) (HCC)    Hoarseness of voice    Hypertension    MRSA (methicillin resistant staph aureus) culture positive    Sick sinus syndrome (HCC)    Sleep apnea  Tremor     SURGICAL HISTORY: Past Surgical History:   Procedure Laterality Date   CATARACT EXTRACTION     COLONOSCOPY WITH PROPOFOL N/A 01/15/2016   Procedure: COLONOSCOPY WITH PROPOFOL;  Surgeon: Manya Silvas, MD;  Location: [facility] ENDOSCOPY;  Service: Endoscopy;  Laterality: N/A;   ESOPHAGOGASTRODUODENOSCOPY (EGD) WITH PROPOFOL N/A 01/15/2016   Procedure: ESOPHAGOGASTRODUODENOSCOPY (EGD) WITH PROPOFOL;  Surgeon: Manya Silvas, MD;  Location: [facility] ENDOSCOPY;  Service: Endoscopy;  Laterality: N/A;   INSERT / REPLACE / REMOVE PACEMAKER     POSTERIOR LAMINECTOMY / DECOMPRESSION LUMBAR SPINE     PPM GENERATOR CHANGEOUT N/A 08/07/2021   Procedure: PPM GENERATOR CHANGEOUT;  Surgeon: Isaias Cowman, MD;  Location: [facility] CV LAB;  Service: Cardiovascular;  Laterality: N/A;    SOCIAL HISTORY: Social History   Socioeconomic History   Marital status: Married    Spouse name: Not on file   Number of children: Not on file   Years of education: Not on file   Highest education level: Not on file  Occupational History   Not on file  Tobacco Use   Smoking status: Former   Smokeless tobacco: Never  Substance and Sexual Activity   Alcohol use: No   Drug use: No   Sexual activity: Not on file  Other Topics Concern   Not on file  Social History Narrative   Not on file   Social Determinants of Health   Financial Resource Strain: Not on file  Food Insecurity: Not on file  Transportation Needs: Not on file  Physical Activity: Not on file  Stress: Not on file  Social Connections: Not on file  Intimate Partner Violence: Not on file    FAMILY HISTORY: No family history on file.  ALLERGIES:  is allergic to lipitor [atorvastatin].  MEDICATIONS:  Current Outpatient Medications  Medication Sig Dispense Refill   acetaminophen (TYLENOL) 325 MG tablet Take 650 mg by mouth every 6 (six) hours as needed for mild pain, moderate pain, fever or headache.     apixaban (ELIQUIS) 5 MG TABS tablet Take 2 tablets (12m) twice daily for 7  days, then 1 tablet (581m twice daily 60 tablet 3   colchicine 0.6 MG tablet Take 0.6 mg by mouth daily as needed (Gout).     furosemide (LASIX) 20 MG tablet Take 20 mg by mouth daily. 1/2 tab QD     isosorbide mononitrate (IMDUR) 30 MG 24 hr tablet Take 30 mg by mouth daily.     montelukast (SINGULAIR) 10 MG tablet Take 10 mg by mouth at bedtime.     omeprazole (PRILOSEC) 40 MG capsule Take 40 mg by mouth every other day.     propranolol ER (INDERAL LA) 120 MG 24 hr capsule Take 240 mg by mouth daily.     rosuvastatin (CRESTOR) 10 MG tablet Take 10 mg by mouth at bedtime.     amLODipine (NORVASC) 5 MG tablet Take 5 mg by mouth daily. (Patient not taking: Reported on 03/30/2022)     aspirin 81 MG tablet Take 81 mg by mouth every other day.  (Patient not taking: Reported on 03/30/2022)     Biotin 1 MG CAPS Take 1 mg by mouth daily. (Patient not taking: Reported on 03/30/2022)     Cholecalciferol 1000 units tablet Take 1,000 Units by mouth daily. (Patient not taking: Reported on 03/30/2022)     No current facility-administered medications for this visit.    PHYSICAL EXAMINATION:   Vitals:   03/30/22 0800  BP: 103/68  Pulse: 68  Resp: 16  Temp: (!) 96 F (35.6 C)  SpO2: 98%   Filed Weights   03/30/22 0800  Weight: 164 lb 3.2 oz (74.5 kg)    Physical Exam Vitals and nursing note reviewed.  HENT:     Head: Normocephalic and atraumatic.     Mouth/Throat:     Pharynx: Oropharynx is clear.  Eyes:     Extraocular Movements: Extraocular movements intact.     Pupils: Pupils are equal, round, and reactive to light.  Cardiovascular:     Rate and Rhythm: Normal rate and regular rhythm.  Pulmonary:     Comments: Decreased breath sounds bilaterally.  Abdominal:     Palpations: Abdomen is soft.  Musculoskeletal:        General: Normal range of motion.     Cervical back: Normal range of motion.  Skin:    General: Skin is warm.  Neurological:     General: No focal deficit present.      Mental Status: He is alert and oriented to person, place, and time.  Psychiatric:        Behavior: Behavior normal.        Judgment: Judgment normal.     LABORATORY DATA:  I have reviewed the data as listed Lab Results  Component Value Date   WBC 7.6 03/30/2022   HGB 13.2 03/30/2022   HCT 39.3 03/30/2022   MCV 95.4 03/30/2022   PLT 164 03/30/2022   Recent Labs    02/11/22 1255 02/12/22 0450 02/13/22 0535 02/24/22 1344 03/10/22 0914 03/30/22 0829  NA 142 143   < > 139 139 140  K 4.2 4.1   < > 4.1 3.5 4.0  CL 108 114*   < > 104 108 108  CO2 25 24   < > '26 27 26  ' GLUCOSE 116* 100*   < > 96 103* 120*  BUN 47* 49*   < > 59* 48* 27*  CREATININE 2.46* 2.40*   < > 1.87* 2.09* 1.70*  CALCIUM 8.8* 8.2*   < > 8.1* 8.8* 8.7*  GFRNONAA 25* 26*   < > 35* 31* 39*  PROT 6.2* 5.5*  --   --   --   --   ALBUMIN 3.2* 2.8*  --   --   --   --   AST 30 24  --   --   --   --   ALT 22 20  --   --   --   --   ALKPHOS 70 61  --   --   --   --   BILITOT 1.6* 1.3*  --   --   --   --    < > = values in this interval not displayed.    RADIOGRAPHIC STUDIES: I have personally reviewed the radiological images as listed and agreed with the findings in the report. No results found.   Acute ITP (HCC) #Acute platelets 35-40s [nad[place] Aug 2023].s/p dexamethasone 20 mg x 5 days and IVIG infusion 400 mg/m2 x 5.  #Platelets today greater than 100.  Currently in remission.  Continue monitoring without any Nplate.  Checking labs monthly basis/plan Nplate.  If less than 50.  # [Feb 11, 2022] Acute DVT of left lower extremity- on eliquis 5 mg twice daily d/t renal dysfunction. Some mild persistent symptoms but improving. Reviewed bleeding precautions in setting of blood thinner and with thrombocytopenia however benefit outweighs risk currently given improvement in  his platelets. Encouraged compliance with medication. Cautioned against falling, heavy machinery use, etc. will finish Eliquis toards end of Nov [3  months]-  # CKD- experienced AKI on CKD stage 3b during hospitalization. Improved with gentle hydration. Today, GFR is 35. Avoid nephrotoxic substances and gentle hydration. Not currently followed by nephrology. PCP has been managing.   # Chronic Diastolic Heart failure- preserved EF of 50-55%; SSS- pacemaker- ? COPD-   # DISPOSITION:  # no N- plate today # 2 weeks- labs- cbc possible N- plate # 4 weeks- labs- cbc possible N- plate # 6 weeks- MD; labs- cbc/bmp possible N- plate;  Dopplers Left LE  Dr.B       Above plan of care was discussed with patient/family in detail.  My contact information was given to the patient/family.       [name], MD 03/30/2022 9:15 AM

## 2022-03-30 NOTE — Assessment & Plan Note (Addendum)
#  Acute platelets 35-40s [nadir Aug 2023].s/p dexamethasone 20 mg x 5 days and IVIG infusion 400 mg/m2 x 5.  #Platelets today greater than 100.  Currently in remission.  Continue monitoring without any Nplate.  Checking labs monthly basis/plan Nplate.  If less than 50.  # [Feb 11, 2022] Acute DVT of left lower extremity- on eliquis 5 mg twice daily d/t renal dysfunction. Some mild persistent symptoms but improving. Reviewed bleeding precautions in setting of blood thinner and with thrombocytopenia however benefit outweighs risk currently given improvement in his platelets. Encouraged compliance with medication. Cautioned against falling, heavy machinery use, etc. will finish Eliquis toards end of Nov [3 months]-hold off any hypercoagulable work-up at this time; consider next visit.  # CKD- experienced AKI on CKD stage 3b during hospitalization. Improved with gentle hydration. Today, GFR is 35. Avoid nephrotoxic substances and gentle hydration. Not currently followed by nephrology. PCP has been managing.   # Chronic Diastolic Heart failure- preserved EF of 50-55%; SSS- pacemaker- ? COPD-   # DISPOSITION:  # no N- plate today # 2 weeks- labs- cbc possible N- plate # 4 weeks- labs- cbc possible N- plate # 6 weeks- MD; labs- cbc/bmp possible N- plate;  Dopplers Left LE  Dr.B

## 2022-04-13 ENCOUNTER — Inpatient Hospital Stay: Payer: HMO

## 2022-04-13 DIAGNOSIS — D693 Immune thrombocytopenic purpura: Secondary | ICD-10-CM | POA: Diagnosis not present

## 2022-04-13 LAB — CBC WITH DIFFERENTIAL/PLATELET
Abs Immature Granulocytes: 0.03 10*3/uL (ref 0.00–0.07)
Basophils Absolute: 0.1 10*3/uL (ref 0.0–0.1)
Basophils Relative: 1 %
Eosinophils Absolute: 0.2 10*3/uL (ref 0.0–0.5)
Eosinophils Relative: 2 %
HCT: 42.3 % (ref 39.0–52.0)
Hemoglobin: 13.9 g/dL (ref 13.0–17.0)
Immature Granulocytes: 0 %
Lymphocytes Relative: 22 %
Lymphs Abs: 1.8 10*3/uL (ref 0.7–4.0)
MCH: 32 pg (ref 26.0–34.0)
MCHC: 32.9 g/dL (ref 30.0–36.0)
MCV: 97.5 fL (ref 80.0–100.0)
Monocytes Absolute: 0.8 10*3/uL (ref 0.1–1.0)
Monocytes Relative: 10 %
Neutro Abs: 5.3 10*3/uL (ref 1.7–7.7)
Neutrophils Relative %: 65 %
Platelets: 196 10*3/uL (ref 150–400)
RBC: 4.34 MIL/uL (ref 4.22–5.81)
RDW: 14.2 % (ref 11.5–15.5)
WBC: 8.2 10*3/uL (ref 4.0–10.5)
nRBC: 0 % (ref 0.0–0.2)

## 2022-04-13 NOTE — Progress Notes (Signed)
Per Dr. Rogue Bussing, no Nplate today. Pt informed.

## 2022-04-27 ENCOUNTER — Inpatient Hospital Stay: Payer: HMO | Attending: Nurse Practitioner

## 2022-04-27 ENCOUNTER — Inpatient Hospital Stay: Payer: HMO

## 2022-04-27 DIAGNOSIS — N1832 Chronic kidney disease, stage 3b: Secondary | ICD-10-CM | POA: Diagnosis not present

## 2022-04-27 DIAGNOSIS — D693 Immune thrombocytopenic purpura: Secondary | ICD-10-CM | POA: Diagnosis not present

## 2022-04-27 DIAGNOSIS — Z7901 Long term (current) use of anticoagulants: Secondary | ICD-10-CM | POA: Insufficient documentation

## 2022-04-27 DIAGNOSIS — Z86718 Personal history of other venous thrombosis and embolism: Secondary | ICD-10-CM | POA: Diagnosis not present

## 2022-04-27 LAB — CBC WITH DIFFERENTIAL/PLATELET
Abs Immature Granulocytes: 0.03 10*3/uL (ref 0.00–0.07)
Basophils Absolute: 0.1 10*3/uL (ref 0.0–0.1)
Basophils Relative: 1 %
Eosinophils Absolute: 0.2 10*3/uL (ref 0.0–0.5)
Eosinophils Relative: 3 %
HCT: 41.8 % (ref 39.0–52.0)
Hemoglobin: 13.7 g/dL (ref 13.0–17.0)
Immature Granulocytes: 0 %
Lymphocytes Relative: 23 %
Lymphs Abs: 1.7 10*3/uL (ref 0.7–4.0)
MCH: 31.8 pg (ref 26.0–34.0)
MCHC: 32.8 g/dL (ref 30.0–36.0)
MCV: 97 fL (ref 80.0–100.0)
Monocytes Absolute: 0.6 10*3/uL (ref 0.1–1.0)
Monocytes Relative: 8 %
Neutro Abs: 4.7 10*3/uL (ref 1.7–7.7)
Neutrophils Relative %: 65 %
Platelets: 126 10*3/uL — ABNORMAL LOW (ref 150–400)
RBC: 4.31 MIL/uL (ref 4.22–5.81)
RDW: 14.2 % (ref 11.5–15.5)
WBC: 7.4 10*3/uL (ref 4.0–10.5)
nRBC: 0 % (ref 0.0–0.2)

## 2022-04-27 NOTE — Progress Notes (Signed)
Platelets 126 today. No Nplate today

## 2022-05-04 ENCOUNTER — Ambulatory Visit
Admission: RE | Admit: 2022-05-04 | Discharge: 2022-05-04 | Disposition: A | Discharge status: Home / Self Care | Payer: HMO | Source: Ambulatory Visit | Attending: Internal Medicine | Admitting: Internal Medicine

## 2022-05-04 DIAGNOSIS — I82462 Acute embolism and thrombosis of left calf muscular vein: Secondary | ICD-10-CM | POA: Insufficient documentation

## 2022-05-11 ENCOUNTER — Inpatient Hospital Stay: Payer: HMO

## 2022-05-11 ENCOUNTER — Other Ambulatory Visit: Payer: Self-pay

## 2022-05-11 ENCOUNTER — Inpatient Hospital Stay (HOSPITAL_BASED_OUTPATIENT_CLINIC_OR_DEPARTMENT_OTHER): Payer: HMO | Admitting: Nurse Practitioner

## 2022-05-11 ENCOUNTER — Encounter: Payer: Self-pay | Admitting: Nurse Practitioner

## 2022-05-11 VITALS — BP 151/91 | HR 54 | Temp 96.4°F | Ht 69.0 in | Wt 165.0 lb

## 2022-05-11 DIAGNOSIS — D693 Immune thrombocytopenic purpura: Secondary | ICD-10-CM

## 2022-05-11 DIAGNOSIS — I824Z9 Acute embolism and thrombosis of unspecified deep veins of unspecified distal lower extremity: Secondary | ICD-10-CM

## 2022-05-11 DIAGNOSIS — Z7901 Long term (current) use of anticoagulants: Secondary | ICD-10-CM | POA: Diagnosis not present

## 2022-05-11 LAB — CBC WITH DIFFERENTIAL/PLATELET
Abs Immature Granulocytes: 0.03 10*3/uL (ref 0.00–0.07)
Basophils Absolute: 0.1 10*3/uL (ref 0.0–0.1)
Basophils Relative: 1 %
Eosinophils Absolute: 0.2 10*3/uL (ref 0.0–0.5)
Eosinophils Relative: 3 %
HCT: 44.8 % (ref 39.0–52.0)
Hemoglobin: 14.6 g/dL (ref 13.0–17.0)
Immature Granulocytes: 0 %
Lymphocytes Relative: 23 %
Lymphs Abs: 1.6 10*3/uL (ref 0.7–4.0)
MCH: 31.1 pg (ref 26.0–34.0)
MCHC: 32.6 g/dL (ref 30.0–36.0)
MCV: 95.3 fL (ref 80.0–100.0)
Monocytes Absolute: 0.7 10*3/uL (ref 0.1–1.0)
Monocytes Relative: 10 %
Neutro Abs: 4.1 10*3/uL (ref 1.7–7.7)
Neutrophils Relative %: 63 %
Platelets: 152 10*3/uL (ref 150–400)
RBC: 4.7 MIL/uL (ref 4.22–5.81)
RDW: 13.7 % (ref 11.5–15.5)
WBC: 6.7 10*3/uL (ref 4.0–10.5)
nRBC: 0 % (ref 0.0–0.2)

## 2022-05-11 LAB — BASIC METABOLIC PANEL
Anion gap: 4 — ABNORMAL LOW (ref 5–15)
BUN: 26 mg/dL — ABNORMAL HIGH (ref 8–23)
CO2: 26 mmol/L (ref 22–32)
Calcium: 9 mg/dL (ref 8.9–10.3)
Chloride: 110 mmol/L (ref 98–111)
Creatinine, Ser: 1.86 mg/dL — ABNORMAL HIGH (ref 0.61–1.24)
GFR, Estimated: 35 mL/min — ABNORMAL LOW (ref >60)
Glucose, Bld: 98 mg/dL (ref 70–99)
Potassium: 4.2 mmol/L (ref 3.5–5.1)
Sodium: 140 mmol/L (ref 135–145)

## 2022-05-11 NOTE — Progress Notes (Signed)
[facility] NOTE  Patient Care Team: Rusty Aus, MD as PCP - General (Internal Medicine)  CHIEF COMPLAINTS/PURPOSE OF CONSULTATION: ITP  # Acute ITP [AUG 2023-]- hepatitis b workup was negative. Smear was negative for schiztocytes. No history of cirrhosis or splenomegaly but could consider ultrasound to evaluate further. Today, platelets continue to improve, now 100. Hold off on bone marrow biopsy at this time. dexamethasone 20 mg without improvement, increased to 40 mg. Received IVIG 400 mg/m2 x 5 days.   # Acute left lower extremity DVT in August 2023- on eliquis. VQ scan was performed due to chronic insufficiency which was negative for PE; eliquis 5 mg BID.   # CAD/CHF, chronic kidney disease stage IV.  SEP 2023- CT [non-contrast- Dr.Miller]  1. Mild pulmonary fibrosis in a pattern with apical to basal gradient, featuring irregular peripheral interstitial opacity, a preponderance of ground-glass, and subtle subpleural sparing at the lung bases. Fibrotic findings are significantly worsened compared to prior examination dated 10/24/2019. Findings are suggestive of an alternative diagnosis (not UIP) per consensus guidelines, leading differential consideration NSIP: Diagnosis of Idiopathic Pulmonary Fibrosis: An Official ATS/ERS/JRS/ALAT Clinical Practice Guideline. [place], Iss 5, [phone], Feb 20 2017. 2. Small subpleural consolidative opacities, particularly in the peripheral right lower lobe, with some evidence of central clearing. This morphology carries a wide differential, generally including infectious/inflammatory etiology such as organizing pneumonia and fungal infection, although small acute pulmonary infarction secondary to pulmonary embolus can also have this appearance, particularly in subpleural locations. Consider CT PE angiogram to exclude pulmonary embolus if there is clinical concern based on acute signs and  symptoms. 3. Stable, definitively benign 0.3 cm nodule of the peripheral left upper lobe    HISTORY OF PRESENTING ILLNESS: Darren Gonzales 86 y.o. male with above history of ITP who returns to clinic for follow up. He was previously treated with steroids and IVIG in hospital in August 2023. He continues eliquis 5 mg for acute dvt of LLE. Denies interval falls or bleeding. No black or bloody stools. Reports compliance with medications. Feels well. COntinues to stay active. Persistent LLE sweling and intermittent aching. Not worse.    Review of Systems  Constitutional:  Negative for chills, diaphoresis, fever, malaise/fatigue and weight loss.  HENT:  Negative for nosebleeds and sore throat.   Eyes:  Negative for double vision.  Respiratory:  Positive for shortness of breath. Negative for cough, hemoptysis, sputum production and wheezing.   Cardiovascular:  Negative for chest pain, palpitations, orthopnea and leg swelling.  Gastrointestinal:  Negative for abdominal pain, blood in stool, constipation, diarrhea, heartburn, melena, nausea and vomiting.  Genitourinary:  Negative for dysuria, frequency and urgency.  Musculoskeletal:  Positive for back pain, joint pain and myalgias.  Skin: Negative.  Negative for itching and rash.  Neurological:  Negative for dizziness, tingling, focal weakness, weakness and headaches.  Endo/Heme/Allergies:  Does not bruise/bleed easily.  Psychiatric/Behavioral:  Negative for depression. The patient is not nervous/anxious and does not have insomnia.     MEDICAL HISTORY:  Past Medical History:  Diagnosis Date   Arthritis    Cancer ([place])    COPD (chronic obstructive pulmonary disease) (HCC)    Hoarseness of voice    Hypertension    MRSA (methicillin resistant staph aureus) culture positive    Sick sinus syndrome ([place])    Sleep apnea    Tremor     SURGICAL HISTORY: Past Surgical History:  Procedure Laterality Date  CATARACT EXTRACTION     COLONOSCOPY  WITH PROPOFOL N/A 01/15/2016   Procedure: COLONOSCOPY WITH PROPOFOL;  Surgeon: Manya Silvas, MD;  Location: [facility] ENDOSCOPY;  Service: Endoscopy;  Laterality: N/A;   ESOPHAGOGASTRODUODENOSCOPY (EGD) WITH PROPOFOL N/A 01/15/2016   Procedure: ESOPHAGOGASTRODUODENOSCOPY (EGD) WITH PROPOFOL;  Surgeon: Manya Silvas, MD;  Location: [facility] ENDOSCOPY;  Service: Endoscopy;  Laterality: N/A;   INSERT / REPLACE / REMOVE PACEMAKER     POSTERIOR LAMINECTOMY / DECOMPRESSION LUMBAR SPINE     PPM GENERATOR CHANGEOUT N/A 08/07/2021   Procedure: PPM GENERATOR CHANGEOUT;  Surgeon: Isaias Cowman, MD;  Location: [facility] CV LAB;  Service: Cardiovascular;  Laterality: N/A;    SOCIAL HISTORY: Social History   Socioeconomic History   Marital status: Married    Spouse name: Not on file   Number of children: Not on file   Years of education: Not on file   Highest education level: Not on file  Occupational History   Not on file  Tobacco Use   Smoking status: Former   Smokeless tobacco: Never  Substance and Sexual Activity   Alcohol use: No   Drug use: No   Sexual activity: Not on file  Other Topics Concern   Not on file  Social History Narrative   Not on file   Social Determinants of Health   Financial Resource Strain: Not on file  Food Insecurity: Not on file  Transportation Needs: Not on file  Physical Activity: Not on file  Stress: Not on file  Social Connections: Not on file  Intimate Partner Violence: Not on file    FAMILY HISTORY: History reviewed. No pertinent family history.  ALLERGIES:  is allergic to lipitor [atorvastatin].  MEDICATIONS:  Current Outpatient Medications  Medication Sig Dispense Refill   acetaminophen (TYLENOL) 325 MG tablet Take 650 mg by mouth every 6 (six) hours as needed for mild pain, moderate pain, fever or headache.     apixaban (ELIQUIS) 5 MG TABS tablet Take 2 tablets (31m) twice daily for 7 days, then 1 tablet (545m twice daily 60 tablet 3    colchicine 0.6 MG tablet Take 0.6 mg by mouth daily as needed (Gout).     furosemide (LASIX) 20 MG tablet Take 20 mg by mouth daily. 1/2 tab QD     isosorbide mononitrate (IMDUR) 30 MG 24 hr tablet Take 30 mg by mouth daily.     montelukast (SINGULAIR) 10 MG tablet Take 10 mg by mouth at bedtime.     omeprazole (PRILOSEC) 40 MG capsule Take 40 mg by mouth every other day.     propranolol ER (INDERAL LA) 120 MG 24 hr capsule Take 240 mg by mouth daily.     rosuvastatin (CRESTOR) 10 MG tablet Take 10 mg by mouth at bedtime.     amLODipine (NORVASC) 5 MG tablet Take 5 mg by mouth daily. (Patient not taking: Reported on 03/30/2022)     aspirin 81 MG tablet Take 81 mg by mouth every other day.  (Patient not taking: Reported on 03/30/2022)     Biotin 1 MG CAPS Take 1 mg by mouth daily. (Patient not taking: Reported on 03/30/2022)     Cholecalciferol 1000 units tablet Take 1,000 Units by mouth daily. (Patient not taking: Reported on 03/30/2022)     No current facility-administered medications for this visit.    PHYSICAL EXAMINATION:   Vitals:   05/11/22 0941  BP: (!) 151/91  Pulse: (!) 54  Temp: (!) 96.4 F (35.8 C)  Filed Weights   05/11/22 0941  Weight: 165 lb (74.8 kg)    Physical Exam Vitals reviewed.  Constitutional:      Appearance: He is not ill-appearing.  Eyes:     Comments: Bleeding of left eye. Per patient- chronic intermittent problem. Recent episode occurred with sneezing.   Cardiovascular:     Rate and Rhythm: Normal rate and regular rhythm.  Pulmonary:     Effort: Pulmonary effort is normal.     Breath sounds: Normal breath sounds.     Comments: Decreased breath sounds bilaterally.  Abdominal:     General: There is no distension.     Palpations: Abdomen is soft.  Musculoskeletal:     Right lower leg: No edema.     Left lower leg: Edema present.  Skin:    General: Skin is warm and dry.     Coloration: Skin is not pale.  Neurological:     Mental Status: He is  alert and oriented to person, place, and time.  Psychiatric:        Mood and Affect: Mood normal.        Behavior: Behavior normal.     LABORATORY DATA:  I have reviewed the data as listed Lab Results  Component Value Date   WBC 6.7 05/11/2022   HGB 14.6 05/11/2022   HCT 44.8 05/11/2022   MCV 95.3 05/11/2022   PLT 152 05/11/2022   Recent Labs    02/11/22 1255 02/12/22 0450 02/13/22 0535 03/10/22 0914 03/30/22 0829 05/11/22 0922  NA 142 143   < > 139 140 140  K 4.2 4.1   < > 3.5 4.0 4.2  CL 108 114*   < > 108 108 110  CO2 25 24   < > _0 GLUCOSE 116* 100*   < > 103* 120* 98  BUN 47* 49*   < > 48* 27* 26*  CREATININE 2.46* 2.40*   < > 2.09* 1.70* 1.86*  CALCIUM 8.8* 8.2*   < > 8.8* 8.7* 9.0  GFRNONAA 25* 26*   < > 31* 39* 35*  PROT 6.2* 5.5*  --   --   --   --   ALBUMIN 3.2* 2.8*  --   --   --   --   AST 30 24  --   --   --   --   ALT 22 20  --   --   --   --   ALKPHOS 70 61  --   --   --   --   BILITOT 1.6* 1.3*  --   --   --   --    < > = values in this interval not displayed.     RADIOGRAPHIC STUDIES: I have personally reviewed the radiological images as listed and agreed with the findings in the report. US Venous Img Lower Unilateral Left (DVT)  Result Date: 05/04/2022 CLINICAL DATA:  LEFT lower extremity pain and swelling. History of DVT. EXAM: LEFT LOWER EXTREMITY VENOUS DOPPLER ULTRASOUND TECHNIQUE: Gray-scale sonography with compression, as well as color and duplex ultrasound, were performed to evaluate the deep venous system(s) from the level of the common femoral vein through the popliteal and proximal calf veins. COMPARISON:  LEFT lower extremity venous duplex, 02/11/2022. FINDINGS: VENOUS Normal compressibility of the LEFT common femoral and superficial femoral as well as the visualized calf veins. Visualized portions of profunda femoral vein and great saphenous vein unremarkable. Heterogeneously echogenic, near-occlusive filling defect and  incomplete  coaptation within the imaged portions of the LEFT popliteal vein. Limited views of the contralateral common femoral vein are unremarkable. OTHER No evidence of superficial thrombophlebitis or abnormal fluid collection. Limitations: none IMPRESSION: Examination is POSITIVE for persistent, subacute to chronic-appearing DVT, within the LEFT lower extremity at the popliteal vein. Michaelle Birks, MD [facility] Radiology Electronically Signed   By: Michaelle Birks M.D.   On: 05/04/2022 10:58      Assessment & Plan:  No problem-specific Assessment & Plan notes found for this encounter.  Acute ITP (HCC) #Acute platelets 35-40s [nadir Aug 2023].s/p dexamethasone 20 mg x 5 days and IVIG infusion 400 mg/m2 x 5.Platelets remains > 100. Currently in remission. Continue to monitor. Plan to give n plate if platelets < 50. Will transition to monthly monitoring.   # [Feb 11, 2022] Acute DVT of left lower extremity- on eliquis 5 mg twice daily d/t renal dysfunction. Some mild persistent symptoms but improving. Tolerating well. Again reviewed bleeding precautions. Would monitor closely if platelets drop. Ultrasound revealed persistent dvt. Recommend continuing eliquis for now. May consider repeating ultrasound in 3 months.    # CKD- experienced AKI on CKD stage 3b during hospitalization. Improved with gentle hydration. Today, GFR is 35. Avoid nephrotoxic substances and gentle hydration. Not currently followed by nephrology. PCP has been managing.    # Chronic Diastolic Heart failure- preserved EF of 50-55%; SSS- pacemaker- ? COPD-    # DISPOSITION:  # 4 weeks- labs (cbc), poss n plate # 8 weeks- labs (cbc, bmp), Dr B, poss n plate- la  Above plan of care was discussed with patient/family in detail.  My contact information was given to the patient/family.    Verlon Au, NP 05/11/2022

## 2022-06-05 ENCOUNTER — Other Ambulatory Visit: Payer: Self-pay

## 2022-06-05 DIAGNOSIS — D693 Immune thrombocytopenic purpura: Secondary | ICD-10-CM

## 2022-06-08 ENCOUNTER — Inpatient Hospital Stay: Payer: HMO | Attending: Nurse Practitioner

## 2022-06-08 ENCOUNTER — Inpatient Hospital Stay: Payer: HMO

## 2022-06-08 DIAGNOSIS — D693 Immune thrombocytopenic purpura: Secondary | ICD-10-CM | POA: Diagnosis present

## 2022-06-08 LAB — CBC WITH DIFFERENTIAL/PLATELET
Abs Immature Granulocytes: 0.03 10*3/uL (ref 0.00–0.07)
Basophils Absolute: 0.1 10*3/uL (ref 0.0–0.1)
Basophils Relative: 1 %
Eosinophils Absolute: 0.3 10*3/uL (ref 0.0–0.5)
Eosinophils Relative: 4 %
HCT: 44.8 % (ref 39.0–52.0)
Hemoglobin: 14.9 g/dL (ref 13.0–17.0)
Immature Granulocytes: 0 %
Lymphocytes Relative: 25 %
Lymphs Abs: 1.7 10*3/uL (ref 0.7–4.0)
MCH: 31.1 pg (ref 26.0–34.0)
MCHC: 33.3 g/dL (ref 30.0–36.0)
MCV: 93.5 fL (ref 80.0–100.0)
Monocytes Absolute: 0.6 10*3/uL (ref 0.1–1.0)
Monocytes Relative: 8 %
Neutro Abs: 4.2 10*3/uL (ref 1.7–7.7)
Neutrophils Relative %: 62 %
Platelets: 142 10*3/uL — ABNORMAL LOW (ref 150–400)
RBC: 4.79 MIL/uL (ref 4.22–5.81)
RDW: 12.8 % (ref 11.5–15.5)
WBC: 6.9 10*3/uL (ref 4.0–10.5)
nRBC: 0 % (ref 0.0–0.2)

## 2022-06-08 NOTE — Progress Notes (Unsigned)
Patient scheduled for possible nplate today, platelet count 142. Injection not needed. Patient made aware and copy of todays results given.

## 2022-06-23 DIAGNOSIS — N1832 Chronic kidney disease, stage 3b: Secondary | ICD-10-CM | POA: Diagnosis not present

## 2022-06-23 DIAGNOSIS — E782 Mixed hyperlipidemia: Secondary | ICD-10-CM | POA: Diagnosis not present

## 2022-06-30 DIAGNOSIS — I700 Atherosclerosis of aorta: Secondary | ICD-10-CM | POA: Diagnosis not present

## 2022-06-30 DIAGNOSIS — D693 Immune thrombocytopenic purpura: Secondary | ICD-10-CM | POA: Diagnosis not present

## 2022-06-30 DIAGNOSIS — Z0000 Encounter for general adult medical examination without abnormal findings: Secondary | ICD-10-CM | POA: Diagnosis not present

## 2022-06-30 DIAGNOSIS — R739 Hyperglycemia, unspecified: Secondary | ICD-10-CM | POA: Diagnosis not present

## 2022-06-30 DIAGNOSIS — I495 Sick sinus syndrome: Secondary | ICD-10-CM | POA: Diagnosis not present

## 2022-06-30 DIAGNOSIS — J431 Panlobular emphysema: Secondary | ICD-10-CM | POA: Diagnosis not present

## 2022-06-30 DIAGNOSIS — I5032 Chronic diastolic (congestive) heart failure: Secondary | ICD-10-CM | POA: Diagnosis not present

## 2022-06-30 DIAGNOSIS — N1832 Chronic kidney disease, stage 3b: Secondary | ICD-10-CM | POA: Diagnosis not present

## 2022-06-30 DIAGNOSIS — I82402 Acute embolism and thrombosis of unspecified deep veins of left lower extremity: Secondary | ICD-10-CM | POA: Diagnosis not present

## 2022-06-30 DIAGNOSIS — Z125 Encounter for screening for malignant neoplasm of prostate: Secondary | ICD-10-CM | POA: Diagnosis not present

## 2022-07-07 ENCOUNTER — Encounter: Payer: Self-pay | Admitting: Internal Medicine

## 2022-07-07 ENCOUNTER — Inpatient Hospital Stay: Payer: HMO

## 2022-07-07 ENCOUNTER — Inpatient Hospital Stay: Payer: HMO | Attending: Nurse Practitioner

## 2022-07-07 ENCOUNTER — Inpatient Hospital Stay (HOSPITAL_BASED_OUTPATIENT_CLINIC_OR_DEPARTMENT_OTHER): Payer: HMO | Admitting: Internal Medicine

## 2022-07-07 VITALS — BP 151/97 | HR 73 | Temp 96.2°F | Resp 16 | Wt 176.8 lb

## 2022-07-07 DIAGNOSIS — D693 Immune thrombocytopenic purpura: Secondary | ICD-10-CM

## 2022-07-07 DIAGNOSIS — Z86718 Personal history of other venous thrombosis and embolism: Secondary | ICD-10-CM | POA: Insufficient documentation

## 2022-07-07 DIAGNOSIS — Z7901 Long term (current) use of anticoagulants: Secondary | ICD-10-CM | POA: Insufficient documentation

## 2022-07-07 LAB — CBC WITH DIFFERENTIAL/PLATELET
Abs Immature Granulocytes: 0.02 10*3/uL (ref 0.00–0.07)
Basophils Absolute: 0.1 10*3/uL (ref 0.0–0.1)
Basophils Relative: 1 %
Eosinophils Absolute: 0.2 10*3/uL (ref 0.0–0.5)
Eosinophils Relative: 3 %
HCT: 45.5 % (ref 39.0–52.0)
Hemoglobin: 15 g/dL (ref 13.0–17.0)
Immature Granulocytes: 0 %
Lymphocytes Relative: 25 %
Lymphs Abs: 1.9 10*3/uL (ref 0.7–4.0)
MCH: 31 pg (ref 26.0–34.0)
MCHC: 33 g/dL (ref 30.0–36.0)
MCV: 94 fL (ref 80.0–100.0)
Monocytes Absolute: 0.6 10*3/uL (ref 0.1–1.0)
Monocytes Relative: 8 %
Neutro Abs: 4.8 10*3/uL (ref 1.7–7.7)
Neutrophils Relative %: 63 %
Platelets: 147 10*3/uL — ABNORMAL LOW (ref 150–400)
RBC: 4.84 MIL/uL (ref 4.22–5.81)
RDW: 12.4 % (ref 11.5–15.5)
WBC: 7.6 10*3/uL (ref 4.0–10.5)
nRBC: 0 % (ref 0.0–0.2)

## 2022-07-07 LAB — BASIC METABOLIC PANEL
Anion gap: 9 (ref 5–15)
BUN: 18 mg/dL (ref 8–23)
CO2: 24 mmol/L (ref 22–32)
Calcium: 8.3 mg/dL — ABNORMAL LOW (ref 8.9–10.3)
Chloride: 106 mmol/L (ref 98–111)
Creatinine, Ser: 1.59 mg/dL — ABNORMAL HIGH (ref 0.61–1.24)
GFR, Estimated: 43 mL/min — ABNORMAL LOW (ref >60)
Glucose, Bld: 100 mg/dL — ABNORMAL HIGH (ref 70–99)
Potassium: 4.1 mmol/L (ref 3.5–5.1)
Sodium: 139 mmol/L (ref 135–145)

## 2022-07-07 NOTE — Progress Notes (Signed)
Has stopped Eliquis and now taking ASA 81 mg.    BP 151/97, HR 73

## 2022-07-07 NOTE — Progress Notes (Signed)
[facility] NOTE  Patient Care Team: Rusty Aus, MD as PCP - General (Internal Medicine)  CHIEF COMPLAINTS/PURPOSE OF CONSULTATION: ITP  # Acute ITP [AUG 2023-]- hepatitis b workup was negative. Smear was negative for schiztocytes. No history of cirrhosis or splenomegaly but could consider ultrasound to evaluate further. Today, platelets continue to improve, now 100. Hold off on bone marrow biopsy at this time. dexamethasone 20 mg without improvement, increased to 40 mg. Received IVIG 400 mg/m2 x 5 days.   # Acute left lower extremity DVT in August 2023- on eliquis. VQ scan was performed due to chronic insufficiency which was negative for PE; eliquis 5 mg BID.   # CAD/CHF, chronic kidney disease stage IV.  SEP 2023- CT [non-contrast- Dr.Miller]  1. Mild pulmonary fibrosis in a pattern with apical to basal gradient, featuring irregular peripheral interstitial opacity, a preponderance of ground-glass, and subtle subpleural sparing at the lung bases. Fibrotic findings are significantly worsened compared to prior examination dated 10/24/2019. Findings are suggestive of an alternative diagnosis (not UIP) per consensus guidelines, leading differential consideration NSIP: Diagnosis of Idiopathic Pulmonary Fibrosis: An Official ATS/ERS/JRS/ALAT Clinical Practice Guideline. [place], Iss 5, [phone], Feb 20 2017. 2. Small subpleural consolidative opacities, particularly in the peripheral right lower lobe, with some evidence of central clearing. This morphology carries a wide differential, generally including infectious/inflammatory etiology such as organizing pneumonia and fungal infection, although small acute pulmonary infarction secondary to pulmonary embolus can also have this appearance, particularly in subpleural locations. Consider CT PE angiogram to exclude pulmonary embolus if there is clinical concern based on acute signs and  symptoms. 3. Stable, definitively benign 0.3 cm nodule of the peripheral left upper lobe    HISTORY OF PRESENTING ILLNESS: Alone.  Ambulating independently. Darren Gonzales 87 y.o.  male ITP-status CHF CAD CKD; and history of left lower extremity DVT is here for follow-up.  Patient is currently off Eliquis since December 2023.  Complains of mild swelling in the leg not any worse.  Patient denies any bleeding problems.  Denies any falls.   Patient admits to improvement of his shortness of breath.  Intermittent pain in his left calf/thigh.  Otherwise no blood in stools or black or stools.  Review of Systems  Constitutional:  Negative for chills, diaphoresis, fever, malaise/fatigue and weight loss.  HENT:  Negative for nosebleeds and sore throat.   Eyes:  Negative for double vision.  Respiratory:  Positive for shortness of breath. Negative for cough, hemoptysis, sputum production and wheezing.   Cardiovascular:  Negative for chest pain, palpitations, orthopnea and leg swelling.  Gastrointestinal:  Negative for abdominal pain, blood in stool, constipation, diarrhea, heartburn, melena, nausea and vomiting.  Genitourinary:  Negative for dysuria, frequency and urgency.  Musculoskeletal:  Positive for back pain, joint pain and myalgias.  Skin: Negative.  Negative for itching and rash.  Neurological:  Negative for dizziness, tingling, focal weakness, weakness and headaches.  Endo/Heme/Allergies:  Does not bruise/bleed easily.  Psychiatric/Behavioral:  Negative for depression. The patient is not nervous/anxious and does not have insomnia.     MEDICAL HISTORY:  Past Medical History:  Diagnosis Date   Arthritis    Cancer ([place])    COPD (chronic obstructive pulmonary disease) (HCC)    Hoarseness of voice    Hypertension    MRSA (methicillin resistant staph aureus) culture positive    Sick sinus syndrome (HCC)    Sleep apnea    Tremor  SURGICAL HISTORY: Past Surgical History:   Procedure Laterality Date   CATARACT EXTRACTION     COLONOSCOPY WITH PROPOFOL N/A 01/15/2016   Procedure: COLONOSCOPY WITH PROPOFOL;  Surgeon: Manya Silvas, MD;  Location: [facility] ENDOSCOPY;  Service: Endoscopy;  Laterality: N/A;   ESOPHAGOGASTRODUODENOSCOPY (EGD) WITH PROPOFOL N/A 01/15/2016   Procedure: ESOPHAGOGASTRODUODENOSCOPY (EGD) WITH PROPOFOL;  Surgeon: Manya Silvas, MD;  Location: [facility] ENDOSCOPY;  Service: Endoscopy;  Laterality: N/A;   INSERT / REPLACE / REMOVE PACEMAKER     POSTERIOR LAMINECTOMY / DECOMPRESSION LUMBAR SPINE     PPM GENERATOR CHANGEOUT N/A 08/07/2021   Procedure: PPM GENERATOR CHANGEOUT;  Surgeon: Isaias Cowman, MD;  Location: [facility] CV LAB;  Service: Cardiovascular;  Laterality: N/A;    SOCIAL HISTORY: Social History   Socioeconomic History   Marital status: Married    Spouse name: Not on file   Number of children: Not on file   Years of education: Not on file   Highest education level: Not on file  Occupational History   Not on file  Tobacco Use   Smoking status: Former   Smokeless tobacco: Never  Substance and Sexual Activity   Alcohol use: No   Drug use: No   Sexual activity: Not on file  Other Topics Concern   Not on file  Social History Narrative   Not on file   Social Determinants of Health   Financial Resource Strain: Not on file  Food Insecurity: Not on file  Transportation Needs: Not on file  Physical Activity: Not on file  Stress: Not on file  Social Connections: Not on file  Intimate Partner Violence: Not on file    FAMILY HISTORY: History reviewed. No pertinent family history.  ALLERGIES:  is allergic to gabapentin and lipitor [atorvastatin].  MEDICATIONS:  Current Outpatient Medications  Medication Sig Dispense Refill   acetaminophen (TYLENOL) 325 MG tablet Take 650 mg by mouth every 6 (six) hours as needed for mild pain, moderate pain, fever or headache.     aspirin 81 MG tablet Take 81 mg by mouth  every other day.     colchicine 0.6 MG tablet Take 0.6 mg by mouth daily as needed (Gout).     furosemide (LASIX) 20 MG tablet Take 20 mg by mouth daily. 1/2 tab QD prn     isosorbide mononitrate (IMDUR) 30 MG 24 hr tablet Take 30 mg by mouth daily.     montelukast (SINGULAIR) 10 MG tablet Take 10 mg by mouth at bedtime.     omeprazole (PRILOSEC) 40 MG capsule Take 40 mg by mouth every other day.     pramipexole (MIRAPEX) 0.125 MG tablet Take 0.125 mg by mouth in the morning and at bedtime.     propranolol ER (INDERAL LA) 60 MG 24 hr capsule Take 60 mg by mouth daily.     rosuvastatin (CRESTOR) 10 MG tablet Take 10 mg by mouth at bedtime.     amLODipine (NORVASC) 5 MG tablet Take 5 mg by mouth daily. (Patient not taking: Reported on 03/30/2022)     apixaban (ELIQUIS) 5 MG TABS tablet Take 2 tablets ('10mg'$ ) twice daily for 7 days, then 1 tablet ('5mg'$ ) twice daily (Patient not taking: Reported on 07/07/2022) 60 tablet 3   Biotin 1 MG CAPS Take 1 mg by mouth daily. (Patient not taking: Reported on 03/30/2022)     Cholecalciferol 1000 units tablet Take 1,000 Units by mouth daily. (Patient not taking: Reported on 03/30/2022)     No  current facility-administered medications for this visit.    PHYSICAL EXAMINATION:   Vitals:   07/07/22 1500  BP: (!) 151/97  Pulse: 73  Resp: 16  Temp: (!) 96.2 F (35.7 C)   Filed Weights   07/07/22 1500  Weight: 176 lb 12.8 oz (80.2 kg)    Physical Exam Vitals and nursing note reviewed.  HENT:     Head: Normocephalic and atraumatic.     Mouth/Throat:     Pharynx: Oropharynx is clear.  Eyes:     Extraocular Movements: Extraocular movements intact.     Pupils: Pupils are equal, round, and reactive to light.  Cardiovascular:     Rate and Rhythm: Normal rate and regular rhythm.  Pulmonary:     Comments: Decreased breath sounds bilaterally.  Abdominal:     Palpations: Abdomen is soft.  Musculoskeletal:        General: Normal range of motion.      Cervical back: Normal range of motion.  Skin:    General: Skin is warm.  Neurological:     General: No focal deficit present.     Mental Status: He is alert and oriented to person, place, and time.  Psychiatric:        Behavior: Behavior normal.        Judgment: Judgment normal.     LABORATORY DATA:  I have reviewed the data as listed Lab Results  Component Value Date   WBC 7.6 07/07/2022   HGB 15.0 07/07/2022   HCT 45.5 07/07/2022   MCV 94.0 07/07/2022   PLT 147 (L) 07/07/2022   Recent Labs    02/11/22 1255 02/12/22 0450 02/13/22 0535 03/30/22 0829 05/11/22 0922 07/07/22 1407  NA 142 143   < > 140 140 139  K 4.2 4.1   < > 4.0 4.2 4.1  CL 108 114*   < > 108 110 106  CO2 25 24   < > '26 26 24  '$ GLUCOSE 116* 100*   < > 120* 98 100*  BUN 47* 49*   < > 27* 26* 18  CREATININE 2.46* 2.40*   < > 1.70* 1.86* 1.59*  CALCIUM 8.8* 8.2*   < > 8.7* 9.0 8.3*  GFRNONAA 25* 26*   < > 39* 35* 43*  PROT 6.2* 5.5*  --   --   --   --   ALBUMIN 3.2* 2.8*  --   --   --   --   AST 30 24  --   --   --   --   ALT 22 20  --   --   --   --   ALKPHOS 70 61  --   --   --   --   BILITOT 1.6* 1.3*  --   --   --   --    < > = values in this interval not displayed.    RADIOGRAPHIC STUDIES: I have personally reviewed the radiological images as listed and agreed with the findings in the report. No results found.   Acute ITP (HCC) #Acute platelets 35-40s [nadir Aug 2023].s/p dexamethasone 20 mg x 5 days and IVIG infusion 400 mg/m2 x 5.  # Platelets today greater than 100.  Currently in remission.  Continue monitoring without any Nplate.  Checking labs monthly basis/plan Nplate-  If less than 100  # [Feb 11, 2022] Acute DVT of left lower extremity- on eliquis 5 mg twice daily d/t renal dysfunction. finished Eliquis toards end of December [  4  months]. NOV 11th, 2023- subacute/chronic DVT- monitor for now- off eliquis.  Currently off any hypercoagulable work-up at this time; consider next visit.    # CKD- experienced AKI on CKD stage 3b during hospitalization. Improved with gentle hydration. Today, GFR is 43 Stable.   # Chronic Diastolic Heart failure- preserved EF of 50-55%; SSS- pacemaker- ? COPD- Stable.   # DISPOSITION:  # no N- plate today # monthly labs- cbc; and possible N-plate x 4 # follow up in 4 months- MD; labs- cbc/bmp; iron studies; ferritin-  possible N- plate;  Dr.B        Above plan of care was discussed with patient/family in detail.  My contact information was given to the patient/family.       [name], MD 07/07/2022 6:17 PM

## 2022-07-07 NOTE — Assessment & Plan Note (Addendum)
#  Acute platelets 35-40s [nadir Aug 2023].s/p dexamethasone 20 mg x 5 days and IVIG infusion 400 mg/m2 x 5.  # Platelets today greater than 100.  Currently in remission.  Continue monitoring without any Nplate.  Checking labs monthly basis/plan Nplate-  If less than 100  # [Feb 11, 2022] Acute DVT of left lower extremity- on eliquis 5 mg twice daily d/t renal dysfunction. finished Eliquis toards end of December [4  months]. NOV 11th, 2023- subacute/chronic DVT- monitor for now- off eliquis.  Currently off any hypercoagulable work-up at this time; consider next visit.   # CKD- experienced AKI on CKD stage 3b during hospitalization. Improved with gentle hydration. Today, GFR is 43 Stable.   # Chronic Diastolic Heart failure- preserved EF of 50-55%; SSS- pacemaker- ? COPD- Stable.   # DISPOSITION:  # no N- plate today # monthly labs- cbc; and possible N-plate x 4 # follow up in 4 months- MD; labs- cbc/bmp; iron studies; ferritin-  possible N- plate;  Dr.B

## 2022-07-08 DIAGNOSIS — L570 Actinic keratosis: Secondary | ICD-10-CM | POA: Diagnosis not present

## 2022-07-08 DIAGNOSIS — D0462 Carcinoma in situ of skin of left upper limb, including shoulder: Secondary | ICD-10-CM | POA: Diagnosis not present

## 2022-07-08 DIAGNOSIS — X32XXXA Exposure to sunlight, initial encounter: Secondary | ICD-10-CM | POA: Diagnosis not present

## 2022-07-08 DIAGNOSIS — C44622 Squamous cell carcinoma of skin of right upper limb, including shoulder: Secondary | ICD-10-CM | POA: Diagnosis not present

## 2022-07-08 DIAGNOSIS — D0461 Carcinoma in situ of skin of right upper limb, including shoulder: Secondary | ICD-10-CM | POA: Diagnosis not present

## 2022-07-08 DIAGNOSIS — D485 Neoplasm of uncertain behavior of skin: Secondary | ICD-10-CM | POA: Diagnosis not present

## 2022-07-08 DIAGNOSIS — C44319 Basal cell carcinoma of skin of other parts of face: Secondary | ICD-10-CM | POA: Diagnosis not present

## 2022-08-07 ENCOUNTER — Inpatient Hospital Stay: Payer: HMO

## 2022-08-07 ENCOUNTER — Inpatient Hospital Stay: Payer: HMO | Attending: Nurse Practitioner

## 2022-08-07 DIAGNOSIS — D693 Immune thrombocytopenic purpura: Secondary | ICD-10-CM | POA: Insufficient documentation

## 2022-08-07 LAB — CBC WITH DIFFERENTIAL/PLATELET
Abs Immature Granulocytes: 0.02 10*3/uL (ref 0.00–0.07)
Basophils Absolute: 0.1 10*3/uL (ref 0.0–0.1)
Basophils Relative: 1 %
Eosinophils Absolute: 0.2 10*3/uL (ref 0.0–0.5)
Eosinophils Relative: 3 %
HCT: 44.2 % (ref 39.0–52.0)
Hemoglobin: 14.5 g/dL (ref 13.0–17.0)
Immature Granulocytes: 0 %
Lymphocytes Relative: 25 %
Lymphs Abs: 1.6 10*3/uL (ref 0.7–4.0)
MCH: 31 pg (ref 26.0–34.0)
MCHC: 32.8 g/dL (ref 30.0–36.0)
MCV: 94.4 fL (ref 80.0–100.0)
Monocytes Absolute: 0.5 10*3/uL (ref 0.1–1.0)
Monocytes Relative: 8 %
Neutro Abs: 4 10*3/uL (ref 1.7–7.7)
Neutrophils Relative %: 63 %
Platelets: 164 10*3/uL (ref 150–400)
RBC: 4.68 MIL/uL (ref 4.22–5.81)
RDW: 12.5 % (ref 11.5–15.5)
WBC: 6.5 10*3/uL (ref 4.0–10.5)
nRBC: 0 % (ref 0.0–0.2)

## 2022-08-07 NOTE — Progress Notes (Signed)
Patients platelets are at 164, no Nplate today

## 2022-08-28 DIAGNOSIS — C44622 Squamous cell carcinoma of skin of right upper limb, including shoulder: Secondary | ICD-10-CM | POA: Diagnosis not present

## 2022-08-28 DIAGNOSIS — D0461 Carcinoma in situ of skin of right upper limb, including shoulder: Secondary | ICD-10-CM | POA: Diagnosis not present

## 2022-09-03 DIAGNOSIS — H903 Sensorineural hearing loss, bilateral: Secondary | ICD-10-CM | POA: Diagnosis not present

## 2022-09-03 DIAGNOSIS — H6123 Impacted cerumen, bilateral: Secondary | ICD-10-CM | POA: Diagnosis not present

## 2022-09-07 ENCOUNTER — Inpatient Hospital Stay: Payer: PPO | Attending: Nurse Practitioner

## 2022-09-07 ENCOUNTER — Encounter: Payer: Self-pay | Admitting: Internal Medicine

## 2022-09-07 ENCOUNTER — Inpatient Hospital Stay: Payer: PPO

## 2022-09-07 DIAGNOSIS — D693 Immune thrombocytopenic purpura: Secondary | ICD-10-CM | POA: Insufficient documentation

## 2022-09-07 LAB — CBC WITH DIFFERENTIAL/PLATELET
Abs Immature Granulocytes: 0.02 10*3/uL (ref 0.00–0.07)
Basophils Absolute: 0.1 10*3/uL (ref 0.0–0.1)
Basophils Relative: 1 %
Eosinophils Absolute: 0.3 10*3/uL (ref 0.0–0.5)
Eosinophils Relative: 4 %
HCT: 45.4 % (ref 39.0–52.0)
Hemoglobin: 14.9 g/dL (ref 13.0–17.0)
Immature Granulocytes: 0 %
Lymphocytes Relative: 27 %
Lymphs Abs: 1.8 10*3/uL (ref 0.7–4.0)
MCH: 30.8 pg (ref 26.0–34.0)
MCHC: 32.8 g/dL (ref 30.0–36.0)
MCV: 93.8 fL (ref 80.0–100.0)
Monocytes Absolute: 0.6 10*3/uL (ref 0.1–1.0)
Monocytes Relative: 10 %
Neutro Abs: 3.8 10*3/uL (ref 1.7–7.7)
Neutrophils Relative %: 58 %
Platelets: 147 10*3/uL — ABNORMAL LOW (ref 150–400)
RBC: 4.84 MIL/uL (ref 4.22–5.81)
RDW: 13.1 % (ref 11.5–15.5)
WBC: 6.6 10*3/uL (ref 4.0–10.5)
nRBC: 0 % (ref 0.0–0.2)

## 2022-09-07 NOTE — Progress Notes (Signed)
Platelets 147 no Nplate

## 2022-09-11 DIAGNOSIS — C44319 Basal cell carcinoma of skin of other parts of face: Secondary | ICD-10-CM | POA: Diagnosis not present

## 2022-09-11 DIAGNOSIS — D2339 Other benign neoplasm of skin of other parts of face: Secondary | ICD-10-CM | POA: Diagnosis not present

## 2022-09-11 DIAGNOSIS — D0462 Carcinoma in situ of skin of left upper limb, including shoulder: Secondary | ICD-10-CM | POA: Diagnosis not present

## 2022-09-17 DIAGNOSIS — I5032 Chronic diastolic (congestive) heart failure: Secondary | ICD-10-CM | POA: Diagnosis not present

## 2022-09-17 DIAGNOSIS — R0602 Shortness of breath: Secondary | ICD-10-CM | POA: Diagnosis not present

## 2022-09-17 DIAGNOSIS — E782 Mixed hyperlipidemia: Secondary | ICD-10-CM | POA: Diagnosis not present

## 2022-09-17 DIAGNOSIS — N1832 Chronic kidney disease, stage 3b: Secondary | ICD-10-CM | POA: Diagnosis not present

## 2022-09-17 DIAGNOSIS — I10 Essential (primary) hypertension: Secondary | ICD-10-CM | POA: Diagnosis not present

## 2022-09-17 DIAGNOSIS — J431 Panlobular emphysema: Secondary | ICD-10-CM | POA: Diagnosis not present

## 2022-09-17 DIAGNOSIS — Z950 Presence of cardiac pacemaker: Secondary | ICD-10-CM | POA: Diagnosis not present

## 2022-09-17 DIAGNOSIS — I2510 Atherosclerotic heart disease of native coronary artery without angina pectoris: Secondary | ICD-10-CM | POA: Diagnosis not present

## 2022-09-21 DIAGNOSIS — Z4802 Encounter for removal of sutures: Secondary | ICD-10-CM | POA: Diagnosis not present

## 2022-09-30 DIAGNOSIS — I5032 Chronic diastolic (congestive) heart failure: Secondary | ICD-10-CM | POA: Diagnosis not present

## 2022-09-30 DIAGNOSIS — I495 Sick sinus syndrome: Secondary | ICD-10-CM | POA: Diagnosis not present

## 2022-10-06 ENCOUNTER — Inpatient Hospital Stay: Payer: PPO

## 2022-10-06 ENCOUNTER — Inpatient Hospital Stay: Payer: PPO | Attending: Nurse Practitioner

## 2022-10-06 DIAGNOSIS — D693 Immune thrombocytopenic purpura: Secondary | ICD-10-CM | POA: Diagnosis not present

## 2022-10-06 LAB — CBC WITH DIFFERENTIAL/PLATELET
Abs Immature Granulocytes: 0.01 10*3/uL (ref 0.00–0.07)
Basophils Absolute: 0 10*3/uL (ref 0.0–0.1)
Basophils Relative: 1 %
Eosinophils Absolute: 0.3 10*3/uL (ref 0.0–0.5)
Eosinophils Relative: 4 %
HCT: 44.5 % (ref 39.0–52.0)
Hemoglobin: 14.7 g/dL (ref 13.0–17.0)
Immature Granulocytes: 0 %
Lymphocytes Relative: 27 %
Lymphs Abs: 1.9 10*3/uL (ref 0.7–4.0)
MCH: 30.9 pg (ref 26.0–34.0)
MCHC: 33 g/dL (ref 30.0–36.0)
MCV: 93.7 fL (ref 80.0–100.0)
Monocytes Absolute: 0.7 10*3/uL (ref 0.1–1.0)
Monocytes Relative: 10 %
Neutro Abs: 4.1 10*3/uL (ref 1.7–7.7)
Neutrophils Relative %: 58 %
Platelets: 140 10*3/uL — ABNORMAL LOW (ref 150–400)
RBC: 4.75 MIL/uL (ref 4.22–5.81)
RDW: 12.8 % (ref 11.5–15.5)
WBC: 6.9 10*3/uL (ref 4.0–10.5)
nRBC: 0 % (ref 0.0–0.2)

## 2022-10-06 NOTE — Progress Notes (Signed)
Platelets 140- no nplate today

## 2022-11-04 ENCOUNTER — Encounter: Payer: Self-pay | Admitting: Internal Medicine

## 2022-11-04 ENCOUNTER — Inpatient Hospital Stay: Payer: PPO | Attending: Nurse Practitioner

## 2022-11-04 ENCOUNTER — Inpatient Hospital Stay (HOSPITAL_BASED_OUTPATIENT_CLINIC_OR_DEPARTMENT_OTHER): Payer: PPO | Admitting: Internal Medicine

## 2022-11-04 ENCOUNTER — Inpatient Hospital Stay: Payer: PPO

## 2022-11-04 VITALS — BP 131/77 | HR 84 | Temp 95.5°F | Ht 69.0 in | Wt 181.8 lb

## 2022-11-04 DIAGNOSIS — D693 Immune thrombocytopenic purpura: Secondary | ICD-10-CM

## 2022-11-04 DIAGNOSIS — N184 Chronic kidney disease, stage 4 (severe): Secondary | ICD-10-CM | POA: Diagnosis not present

## 2022-11-04 DIAGNOSIS — I130 Hypertensive heart and chronic kidney disease with heart failure and stage 1 through stage 4 chronic kidney disease, or unspecified chronic kidney disease: Secondary | ICD-10-CM | POA: Insufficient documentation

## 2022-11-04 LAB — CBC WITH DIFFERENTIAL/PLATELET
Abs Immature Granulocytes: 0.01 10*3/uL (ref 0.00–0.07)
Basophils Absolute: 0.1 10*3/uL (ref 0.0–0.1)
Basophils Relative: 1 %
Eosinophils Absolute: 0.3 10*3/uL (ref 0.0–0.5)
Eosinophils Relative: 4 %
HCT: 44.1 % (ref 39.0–52.0)
Hemoglobin: 15.1 g/dL (ref 13.0–17.0)
Immature Granulocytes: 0 %
Lymphocytes Relative: 28 %
Lymphs Abs: 1.9 10*3/uL (ref 0.7–4.0)
MCH: 31.5 pg (ref 26.0–34.0)
MCHC: 34.2 g/dL (ref 30.0–36.0)
MCV: 91.9 fL (ref 80.0–100.0)
Monocytes Absolute: 0.5 10*3/uL (ref 0.1–1.0)
Monocytes Relative: 8 %
Neutro Abs: 3.9 10*3/uL (ref 1.7–7.7)
Neutrophils Relative %: 59 %
Platelets: 150 10*3/uL (ref 150–400)
RBC: 4.8 MIL/uL (ref 4.22–5.81)
RDW: 13.3 % (ref 11.5–15.5)
WBC: 6.7 10*3/uL (ref 4.0–10.5)
nRBC: 0 % (ref 0.0–0.2)

## 2022-11-04 LAB — FERRITIN: Ferritin: 47 ng/mL (ref 24–336)

## 2022-11-04 LAB — BASIC METABOLIC PANEL
Anion gap: 10 (ref 5–15)
BUN: 27 mg/dL — ABNORMAL HIGH (ref 8–23)
CO2: 24 mmol/L (ref 22–32)
Calcium: 8.9 mg/dL (ref 8.9–10.3)
Chloride: 106 mmol/L (ref 98–111)
Creatinine, Ser: 1.75 mg/dL — ABNORMAL HIGH (ref 0.61–1.24)
GFR, Estimated: 38 mL/min — ABNORMAL LOW (ref >60)
Glucose, Bld: 102 mg/dL — ABNORMAL HIGH (ref 70–99)
Potassium: 3.5 mmol/L (ref 3.5–5.1)
Sodium: 140 mmol/L (ref 135–145)

## 2022-11-04 LAB — IRON AND TIBC
Iron: 94 ug/dL (ref 45–182)
Saturation Ratios: 28 % (ref 17.9–39.5)
TIBC: 342 ug/dL (ref 250–450)
UIBC: 248 ug/dL

## 2022-11-04 NOTE — Progress Notes (Signed)
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 Smear was negative for schiztocytes. No history of cirrhosis or splenomegaly but could consider ultrasound to evaluate further. Today, platelets continue to improve, now 100. Hold off on bone marrow biopsy at this time. dexamethasone 20 mg without improvement, increased to 40 mg. Received IVIG 400 mg/m2 x 5 days.   # Acute left lower extremity DVT in August 2023- on eliquis. VQ scan was performed due to chronic insufficiency which was negative for PE; eliquis 5 mg BID x stopped in dec 2023.    # CAD/CHF, chronic kidney disease stage IV.  SEP 2023- CT [non-contrast- Dr.Miller]  1. Mild pulmonary fibrosis in a pattern with apical to basal gradient, featuring irregular peripheral interstitial opacity, a preponderance of ground-glass, and subtle subpleural sparing at the lung bases. Fibrotic findings are significantly worsened compared to prior examination dated 10/24/2019. Findings are suggestive of an alternative diagnosis (not UIP) per consensus guidelines, leading differential consideration NSIP: Diagnosis of Idiopathic Pulmonary Fibrosis: An Official ATS/ERS/JRS/ALAT Clinical Practice Guideline. Am Rosezetta Schlatter Crit Care Med Vol 198, Iss 5, [phone], Feb 20 2017. 2. Small subpleural consolidative opacities, particularly in the peripheral right lower lobe, with some evidence of central clearing. This morphology carries a wide differential, generally including infectious/inflammatory etiology such as organizing pneumonia and fungal infection, although small acute pulmonary infarction secondary to pulmonary embolus can also have this appearance, particularly in subpleural locations. Consider CT PE angiogram to exclude pulmonary embolus if there is clinical concern based  on acute signs and symptoms. 3. Stable, definitively benign 0.3 cm nodule of the peripheral left upper lobe    HISTORY OF PRESENTING ILLNESS: Alone.  Ambulating independently. Darren Gonzales 87 y.o.  male ITP-status CHF CAD CKD; and history of left lower extremity DVT- off eliquis [stopped in dec 2024]  is here for follow-up.  C/o swelling lt leg/ankle since last August.  Patient denies any bleeding problems.  Denies any falls.   Patient admits to improvement of his shortness of breath.  Intermittent pain in his left calf/thigh.  Otherwise no blood in stools or black or stools.  Review of Systems  Constitutional:  Negative for chills, diaphoresis, fever, malaise/fatigue and weight loss.  HENT:  Negative for nosebleeds and sore throat.   Eyes:  Negative for double vision.  Respiratory:  Positive for shortness of breath. Negative for cough, hemoptysis, sputum production and wheezing.   Cardiovascular:  Negative for chest pain, palpitations, orthopnea and leg swelling.  Gastrointestinal:  Negative for abdominal pain, blood in stool, constipation, diarrhea, heartburn, melena, nausea and vomiting.  Genitourinary:  Negative for dysuria, frequency and urgency.  Musculoskeletal:  Positive for back pain, joint pain and myalgias.  Skin: Negative.  Negative for itching and rash.  Neurological:  Negative for dizziness, tingling, focal weakness, weakness and headaches.  Endo/Heme/Allergies:  Does not bruise/bleed easily.  Psychiatric/Behavioral:  Negative for depression. The patient is not nervous/anxious and does not have insomnia.     MEDICAL HISTORY:  Past Medical History:  Diagnosis Date   Arthritis    Cancer (HCC)    COPD (chronic obstructive pulmonary disease) (HCC)    Hoarseness of voice    Hypertension    MRSA (methicillin resistant staph aureus) culture positive    Sick sinus syndrome (HCC)    Sleep apnea  Tremor     SURGICAL HISTORY: Past Surgical History:  Procedure  Laterality Date   CATARACT EXTRACTION     COLONOSCOPY WITH PROPOFOL N/A 01/15/2016   Procedure: COLONOSCOPY WITH PROPOFOL;  Surgeon: Scot Jun, MD;  Location: [facility] ENDOSCOPY;  Service: Endoscopy;  Laterality: N/A;   ESOPHAGOGASTRODUODENOSCOPY (EGD) WITH PROPOFOL N/A 01/15/2016   Procedure: ESOPHAGOGASTRODUODENOSCOPY (EGD) WITH PROPOFOL;  Surgeon: Scot Jun, MD;  Location: [facility] ENDOSCOPY;  Service: Endoscopy;  Laterality: N/A;   INSERT / REPLACE / REMOVE PACEMAKER     POSTERIOR LAMINECTOMY / DECOMPRESSION LUMBAR SPINE     PPM GENERATOR CHANGEOUT N/A 08/07/2021   Procedure: PPM GENERATOR CHANGEOUT;  Surgeon: Marcina Millard, MD;  Location: [facility] INVASIVE CV LAB;  Service: Cardiovascular;  Laterality: N/A;    SOCIAL HISTORY: Social History   Socioeconomic History   Marital status: Married    Spouse name: Not on file   Number of children: Not on file   Years of education: Not on file   Highest education level: Not on file  Occupational History   Not on file  Tobacco Use   Smoking status: Former   Smokeless tobacco: Never  Substance and Sexual Activity   Alcohol use: No   Drug use: No   Sexual activity: Not on file  Other Topics Concern   Not on file  Social History Narrative   Not on file   Social Determinants of Health   Financial Resource Strain: Not on file  Food Insecurity: Not on file  Transportation Needs: Not on file  Physical Activity: Not on file  Stress: Not on file  Social Connections: Not on file  Intimate Partner Violence: Not on file    FAMILY HISTORY: History reviewed. No pertinent family history.  ALLERGIES:  is allergic to gabapentin and lipitor [atorvastatin].  MEDICATIONS:  Current Outpatient Medications  Medication Sig Dispense Refill   acetaminophen (TYLENOL) 325 MG tablet Take 650 mg by mouth every 6 (six) hours as needed for mild pain, moderate pain, fever or headache.     aspirin 81 MG tablet Take 81 mg by mouth every other  day.     Biotin 1 MG CAPS Take 1 mg by mouth daily.     Cholecalciferol 1000 units tablet Take 1,000 Units by mouth daily.     colchicine 0.6 MG tablet Take 0.6 mg by mouth daily as needed (Gout).     furosemide (LASIX) 20 MG tablet Take 20 mg by mouth daily. 1/2 tab QD prn     isosorbide mononitrate (IMDUR) 30 MG 24 hr tablet Take 30 mg by mouth daily.     montelukast (SINGULAIR) 10 MG tablet Take 10 mg by mouth at bedtime.     omeprazole (PRILOSEC) 40 MG capsule Take 40 mg by mouth every other day.     pramipexole (MIRAPEX) 0.125 MG tablet Take 0.125 mg by mouth in the morning and at bedtime.     propranolol ER (INDERAL LA) 60 MG 24 hr capsule Take 60 mg by mouth daily.     rosuvastatin (CRESTOR) 10 MG tablet Take 10 mg by mouth at bedtime.     amLODipine (NORVASC) 5 MG tablet Take 5 mg by mouth daily. (Patient not taking: Reported on 03/30/2022)     No current facility-administered medications for this visit.    PHYSICAL EXAMINATION:   Vitals:   11/04/22 1422  BP: 131/77  Pulse: 84  Temp: (!) 95.5 F (35.3 C)  SpO2: 96%   Filed Weights  11/04/22 1422  Weight: 181 lb 12.8 oz (82.5 kg)    Physical Exam Vitals and nursing note reviewed.  HENT:     Head: Normocephalic and atraumatic.     Mouth/Throat:     Pharynx: Oropharynx is clear.  Eyes:     Extraocular Movements: Extraocular movements intact.     Pupils: Pupils are equal, round, and reactive to light.  Cardiovascular:     Rate and Rhythm: Normal rate and regular rhythm.  Pulmonary:     Comments: Decreased breath sounds bilaterally.  Abdominal:     Palpations: Abdomen is soft.  Musculoskeletal:        General: Normal range of motion.     Cervical back: Normal range of motion.  Skin:    General: Skin is warm.  Neurological:     General: No focal deficit present.     Mental Status: He is alert and oriented to person, place, and time.  Psychiatric:        Behavior: Behavior normal.        Judgment: Judgment  normal.     LABORATORY DATA:  I have reviewed the data as listed Lab Results  Component Value Date   WBC 6.7 11/04/2022   HGB 15.1 11/04/2022   HCT 44.1 11/04/2022   MCV 91.9 11/04/2022   PLT 150 11/04/2022   Recent Labs    02/11/22 1255 02/12/22 0450 02/13/22 0535 05/11/22 0922 07/07/22 1407 11/04/22 1416  NA 142 143   < > 140 139 140  K 4.2 4.1   < > 4.2 4.1 3.5  CL 108 114*   < > 110 106 106  CO2 25 24   < > 26 24 24   GLUCOSE 116* 100*   < > 98 100* 102*  BUN 47* 49*   < > 26* 18 27*  CREATININE 2.46* 2.40*   < > 1.86* 1.59* 1.75*  CALCIUM 8.8* 8.2*   < > 9.0 8.3* 8.9  GFRNONAA 25* 26*   < > 35* 43* 38*  PROT 6.2* 5.5*  --   --   --   --   ALBUMIN 3.2* 2.8*  --   --   --   --   AST 30 24  --   --   --   --   ALT 22 20  --   --   --   --   ALKPHOS 70 61  --   --   --   --   BILITOT 1.6* 1.3*  --   --   --   --    < > = values in this interval not displayed.    RADIOGRAPHIC STUDIES: I have personally reviewed the radiological images as listed and agreed with the findings in the report. No results found.   Acute ITP (HCC) #Acute platelets 35-40s [nadir Aug 2023].s/p dexamethasone 20 mg x 5 days and IVIG infusion 400 mg/m2 x 5.  # Platelets today greater than 100.  Currently in remission.  Continue monitoring without any Nplate.  Checking labs every other month basis/plan Nplate-  If less than 100.   # [Feb 11, 2022] Acute DVT of left lower extremity- s/p  eliquis 5 mg twice daily d/t renal dysfunction. finished Eliquis toards end of December [4  months]. NOV 11th, 2023- subacute/chronic DVT- monitor for now- off eliquis. HOLD off any hypercoagulable work up. Discussed re: Compression stocking.   # CKD- experienced AKI on CKD stage 3b during hospitalization.stable.   #  Chronic Diastolic Heart failure- preserved EF of 50-55%; SSS- pacemaker- ? COPD- stable.   # DISPOSITION:  # no N- plate today # every 2 months labs- cbc; and possible N-plate x 2 # follow up  in 6 months- MD; labs- cbc/bmp; iron studies; ferritin-  possible N- plate;  Dr.B    Above plan of care was discussed with patient/family in detail.  My contact information was given to the patient/family.     Earna Coder, MD 11/04/2022 3:25 PM

## 2022-11-04 NOTE — Progress Notes (Signed)
C/o swelling lt leg/ankle since last August.  C/o of blurred vision x2 weeks, lightheaded at times, dizzy.

## 2022-11-04 NOTE — Assessment & Plan Note (Addendum)
#  Acute platelets 35-40s [nadir Aug 2023].s/p dexamethasone 20 mg x 5 days and IVIG infusion 400 mg/m2 x 5.  # Platelets today greater than 100.  Currently in remission.  Continue monitoring without any Nplate.  Checking labs every other month basis/plan Nplate-  If less than 100.   # [Feb 11, 2022] Acute DVT of left lower extremity- s/p  eliquis 5 mg twice daily d/t renal dysfunction. finished Eliquis toards end of December [4  months]. NOV 11th, 2023- subacute/chronic DVT- monitor for now- off eliquis. HOLD off any hypercoagulable work up. Discussed re: Compression stocking.   # CKD- experienced AKI on CKD stage 3b during hospitalization.stable.   # Chronic Diastolic Heart failure- preserved EF of 50-55%; SSS- pacemaker- ? COPD- stable.   # DISPOSITION:  # no N- plate today # every 2 months labs- cbc; and possible N-plate x 2 # follow up in 6 months- MD; labs- cbc/bmp; iron studies; ferritin-  possible N- plate;  Dr.B

## 2022-11-11 DIAGNOSIS — D2262 Melanocytic nevi of left upper limb, including shoulder: Secondary | ICD-10-CM | POA: Diagnosis not present

## 2022-11-11 DIAGNOSIS — L821 Other seborrheic keratosis: Secondary | ICD-10-CM | POA: Diagnosis not present

## 2022-11-11 DIAGNOSIS — L570 Actinic keratosis: Secondary | ICD-10-CM | POA: Diagnosis not present

## 2022-11-11 DIAGNOSIS — D0461 Carcinoma in situ of skin of right upper limb, including shoulder: Secondary | ICD-10-CM | POA: Diagnosis not present

## 2022-11-11 DIAGNOSIS — D2261 Melanocytic nevi of right upper limb, including shoulder: Secondary | ICD-10-CM | POA: Diagnosis not present

## 2022-11-11 DIAGNOSIS — D2272 Melanocytic nevi of left lower limb, including hip: Secondary | ICD-10-CM | POA: Diagnosis not present

## 2022-11-11 DIAGNOSIS — D225 Melanocytic nevi of trunk: Secondary | ICD-10-CM | POA: Diagnosis not present

## 2022-11-11 DIAGNOSIS — D485 Neoplasm of uncertain behavior of skin: Secondary | ICD-10-CM | POA: Diagnosis not present

## 2022-11-11 DIAGNOSIS — D044 Carcinoma in situ of skin of scalp and neck: Secondary | ICD-10-CM | POA: Diagnosis not present

## 2022-11-11 DIAGNOSIS — D2271 Melanocytic nevi of right lower limb, including hip: Secondary | ICD-10-CM | POA: Diagnosis not present

## 2022-11-30 DIAGNOSIS — I495 Sick sinus syndrome: Secondary | ICD-10-CM | POA: Diagnosis not present

## 2022-12-16 DIAGNOSIS — D485 Neoplasm of uncertain behavior of skin: Secondary | ICD-10-CM | POA: Diagnosis not present

## 2022-12-16 DIAGNOSIS — D0439 Carcinoma in situ of skin of other parts of face: Secondary | ICD-10-CM | POA: Diagnosis not present

## 2022-12-16 DIAGNOSIS — D0461 Carcinoma in situ of skin of right upper limb, including shoulder: Secondary | ICD-10-CM | POA: Diagnosis not present

## 2022-12-16 DIAGNOSIS — D0472 Carcinoma in situ of skin of left lower limb, including hip: Secondary | ICD-10-CM | POA: Diagnosis not present

## 2023-01-04 ENCOUNTER — Inpatient Hospital Stay: Payer: PPO | Attending: Nurse Practitioner

## 2023-01-04 ENCOUNTER — Inpatient Hospital Stay: Payer: PPO

## 2023-01-04 DIAGNOSIS — D693 Immune thrombocytopenic purpura: Secondary | ICD-10-CM | POA: Insufficient documentation

## 2023-01-04 LAB — CBC WITH DIFFERENTIAL (CANCER CENTER ONLY)
Abs Immature Granulocytes: 0.01 10*3/uL (ref 0.00–0.07)
Basophils Absolute: 0.1 10*3/uL (ref 0.0–0.1)
Basophils Relative: 1 %
Eosinophils Absolute: 0.3 10*3/uL (ref 0.0–0.5)
Eosinophils Relative: 4 %
HCT: 43.8 % (ref 39.0–52.0)
Hemoglobin: 14.8 g/dL (ref 13.0–17.0)
Immature Granulocytes: 0 %
Lymphocytes Relative: 27 %
Lymphs Abs: 1.9 10*3/uL (ref 0.7–4.0)
MCH: 31.6 pg (ref 26.0–34.0)
MCHC: 33.8 g/dL (ref 30.0–36.0)
MCV: 93.4 fL (ref 80.0–100.0)
Monocytes Absolute: 0.7 10*3/uL (ref 0.1–1.0)
Monocytes Relative: 10 %
Neutro Abs: 4.2 10*3/uL (ref 1.7–7.7)
Neutrophils Relative %: 58 %
Platelet Count: 137 10*3/uL — ABNORMAL LOW (ref 150–400)
RBC: 4.69 MIL/uL (ref 4.22–5.81)
RDW: 12.7 % (ref 11.5–15.5)
WBC Count: 7.2 10*3/uL (ref 4.0–10.5)
nRBC: 0 % (ref 0.0–0.2)

## 2023-01-04 NOTE — Progress Notes (Signed)
Patients platelets at 137 today; will hold Nplate

## 2023-01-06 DIAGNOSIS — E538 Deficiency of other specified B group vitamins: Secondary | ICD-10-CM | POA: Diagnosis not present

## 2023-01-06 DIAGNOSIS — Z125 Encounter for screening for malignant neoplasm of prostate: Secondary | ICD-10-CM | POA: Diagnosis not present

## 2023-01-06 DIAGNOSIS — I82402 Acute embolism and thrombosis of unspecified deep veins of left lower extremity: Secondary | ICD-10-CM | POA: Diagnosis not present

## 2023-01-06 DIAGNOSIS — R739 Hyperglycemia, unspecified: Secondary | ICD-10-CM | POA: Diagnosis not present

## 2023-01-06 DIAGNOSIS — I5032 Chronic diastolic (congestive) heart failure: Secondary | ICD-10-CM | POA: Diagnosis not present

## 2023-01-13 DIAGNOSIS — D0439 Carcinoma in situ of skin of other parts of face: Secondary | ICD-10-CM | POA: Diagnosis not present

## 2023-01-13 DIAGNOSIS — I82402 Acute embolism and thrombosis of unspecified deep veins of left lower extremity: Secondary | ICD-10-CM | POA: Diagnosis not present

## 2023-01-13 DIAGNOSIS — M65341 Trigger finger, right ring finger: Secondary | ICD-10-CM | POA: Diagnosis not present

## 2023-01-13 DIAGNOSIS — I5032 Chronic diastolic (congestive) heart failure: Secondary | ICD-10-CM | POA: Diagnosis not present

## 2023-01-13 DIAGNOSIS — R739 Hyperglycemia, unspecified: Secondary | ICD-10-CM | POA: Diagnosis not present

## 2023-01-13 DIAGNOSIS — N184 Chronic kidney disease, stage 4 (severe): Secondary | ICD-10-CM | POA: Diagnosis not present

## 2023-01-13 DIAGNOSIS — E782 Mixed hyperlipidemia: Secondary | ICD-10-CM | POA: Diagnosis not present

## 2023-01-13 DIAGNOSIS — G250 Essential tremor: Secondary | ICD-10-CM | POA: Diagnosis not present

## 2023-01-13 DIAGNOSIS — Z0000 Encounter for general adult medical examination without abnormal findings: Secondary | ICD-10-CM | POA: Diagnosis not present

## 2023-01-18 DIAGNOSIS — M65341 Trigger finger, right ring finger: Secondary | ICD-10-CM | POA: Diagnosis not present

## 2023-03-02 DIAGNOSIS — I495 Sick sinus syndrome: Secondary | ICD-10-CM | POA: Diagnosis not present

## 2023-03-04 DIAGNOSIS — H6123 Impacted cerumen, bilateral: Secondary | ICD-10-CM | POA: Diagnosis not present

## 2023-03-04 DIAGNOSIS — H903 Sensorineural hearing loss, bilateral: Secondary | ICD-10-CM | POA: Diagnosis not present

## 2023-03-08 ENCOUNTER — Inpatient Hospital Stay: Payer: PPO

## 2023-03-08 ENCOUNTER — Inpatient Hospital Stay: Payer: PPO | Attending: Nurse Practitioner

## 2023-03-08 DIAGNOSIS — D693 Immune thrombocytopenic purpura: Secondary | ICD-10-CM | POA: Diagnosis not present

## 2023-03-08 DIAGNOSIS — Z950 Presence of cardiac pacemaker: Secondary | ICD-10-CM | POA: Diagnosis not present

## 2023-03-08 DIAGNOSIS — J439 Emphysema, unspecified: Secondary | ICD-10-CM | POA: Diagnosis not present

## 2023-03-08 DIAGNOSIS — R600 Localized edema: Secondary | ICD-10-CM | POA: Diagnosis not present

## 2023-03-08 DIAGNOSIS — J431 Panlobular emphysema: Secondary | ICD-10-CM | POA: Diagnosis not present

## 2023-03-08 DIAGNOSIS — Z79899 Other long term (current) drug therapy: Secondary | ICD-10-CM | POA: Diagnosis not present

## 2023-03-08 DIAGNOSIS — R0609 Other forms of dyspnea: Secondary | ICD-10-CM | POA: Diagnosis not present

## 2023-03-08 DIAGNOSIS — I2510 Atherosclerotic heart disease of native coronary artery without angina pectoris: Secondary | ICD-10-CM | POA: Diagnosis not present

## 2023-03-08 DIAGNOSIS — E782 Mixed hyperlipidemia: Secondary | ICD-10-CM | POA: Diagnosis not present

## 2023-03-08 DIAGNOSIS — I10 Essential (primary) hypertension: Secondary | ICD-10-CM | POA: Diagnosis not present

## 2023-03-08 DIAGNOSIS — I5032 Chronic diastolic (congestive) heart failure: Secondary | ICD-10-CM | POA: Diagnosis not present

## 2023-03-08 DIAGNOSIS — Z23 Encounter for immunization: Secondary | ICD-10-CM | POA: Diagnosis not present

## 2023-03-08 LAB — CBC WITH DIFFERENTIAL (CANCER CENTER ONLY)
Abs Immature Granulocytes: 0.02 10*3/uL (ref 0.00–0.07)
Basophils Absolute: 0.1 10*3/uL (ref 0.0–0.1)
Basophils Relative: 1 %
Eosinophils Absolute: 0.3 10*3/uL (ref 0.0–0.5)
Eosinophils Relative: 5 %
HCT: 46.6 % (ref 39.0–52.0)
Hemoglobin: 15.3 g/dL (ref 13.0–17.0)
Immature Granulocytes: 0 %
Lymphocytes Relative: 26 %
Lymphs Abs: 1.8 10*3/uL (ref 0.7–4.0)
MCH: 31.3 pg (ref 26.0–34.0)
MCHC: 32.8 g/dL (ref 30.0–36.0)
MCV: 95.3 fL (ref 80.0–100.0)
Monocytes Absolute: 0.7 10*3/uL (ref 0.1–1.0)
Monocytes Relative: 10 %
Neutro Abs: 4 10*3/uL (ref 1.7–7.7)
Neutrophils Relative %: 58 %
Platelet Count: 132 10*3/uL — ABNORMAL LOW (ref 150–400)
RBC: 4.89 MIL/uL (ref 4.22–5.81)
RDW: 12.4 % (ref 11.5–15.5)
WBC Count: 6.8 10*3/uL (ref 4.0–10.5)
nRBC: 0 % (ref 0.0–0.2)

## 2023-03-08 NOTE — Progress Notes (Signed)
Patient did not receive N-Plate injection today due to Platelet count being at 132.

## 2023-03-09 ENCOUNTER — Other Ambulatory Visit
Admission: RE | Admit: 2023-03-09 | Discharge: 2023-03-09 | Disposition: A | Discharge status: Home / Self Care | Payer: PPO | Source: Ambulatory Visit | Attending: Physician Assistant | Admitting: Physician Assistant

## 2023-03-09 DIAGNOSIS — R600 Localized edema: Secondary | ICD-10-CM | POA: Diagnosis not present

## 2023-03-09 DIAGNOSIS — I2510 Atherosclerotic heart disease of native coronary artery without angina pectoris: Secondary | ICD-10-CM | POA: Diagnosis not present

## 2023-03-09 DIAGNOSIS — Z23 Encounter for immunization: Secondary | ICD-10-CM | POA: Diagnosis not present

## 2023-03-09 DIAGNOSIS — Z79899 Other long term (current) drug therapy: Secondary | ICD-10-CM | POA: Insufficient documentation

## 2023-03-09 DIAGNOSIS — I10 Essential (primary) hypertension: Secondary | ICD-10-CM | POA: Diagnosis not present

## 2023-03-09 DIAGNOSIS — E782 Mixed hyperlipidemia: Secondary | ICD-10-CM | POA: Diagnosis not present

## 2023-03-09 DIAGNOSIS — J431 Panlobular emphysema: Secondary | ICD-10-CM | POA: Diagnosis not present

## 2023-03-09 DIAGNOSIS — R0609 Other forms of dyspnea: Secondary | ICD-10-CM | POA: Insufficient documentation

## 2023-03-09 DIAGNOSIS — Z950 Presence of cardiac pacemaker: Secondary | ICD-10-CM | POA: Diagnosis not present

## 2023-03-09 DIAGNOSIS — I5032 Chronic diastolic (congestive) heart failure: Secondary | ICD-10-CM | POA: Diagnosis not present

## 2023-03-09 LAB — BRAIN NATRIURETIC PEPTIDE: B Natriuretic Peptide: 195.6 pg/mL — ABNORMAL HIGH (ref 0.0–100.0)

## 2023-03-17 DIAGNOSIS — L570 Actinic keratosis: Secondary | ICD-10-CM | POA: Diagnosis not present

## 2023-03-17 DIAGNOSIS — D0462 Carcinoma in situ of skin of left upper limb, including shoulder: Secondary | ICD-10-CM | POA: Diagnosis not present

## 2023-03-17 DIAGNOSIS — D044 Carcinoma in situ of skin of scalp and neck: Secondary | ICD-10-CM | POA: Diagnosis not present

## 2023-03-17 DIAGNOSIS — D485 Neoplasm of uncertain behavior of skin: Secondary | ICD-10-CM | POA: Diagnosis not present

## 2023-03-29 DIAGNOSIS — M5116 Intervertebral disc disorders with radiculopathy, lumbar region: Secondary | ICD-10-CM | POA: Diagnosis not present

## 2023-03-29 DIAGNOSIS — M7918 Myalgia, other site: Secondary | ICD-10-CM | POA: Diagnosis not present

## 2023-03-29 DIAGNOSIS — N184 Chronic kidney disease, stage 4 (severe): Secondary | ICD-10-CM | POA: Diagnosis not present

## 2023-03-29 DIAGNOSIS — Z23 Encounter for immunization: Secondary | ICD-10-CM | POA: Diagnosis not present

## 2023-03-31 ENCOUNTER — Other Ambulatory Visit
Admission: RE | Admit: 2023-03-31 | Discharge: 2023-03-31 | Disposition: A | Discharge status: Home / Self Care | Payer: PPO | Source: Ambulatory Visit | Attending: Pulmonary Disease | Admitting: Pulmonary Disease

## 2023-03-31 DIAGNOSIS — J441 Chronic obstructive pulmonary disease with (acute) exacerbation: Secondary | ICD-10-CM | POA: Diagnosis not present

## 2023-03-31 DIAGNOSIS — R7989 Other specified abnormal findings of blood chemistry: Secondary | ICD-10-CM | POA: Diagnosis not present

## 2023-03-31 DIAGNOSIS — R0602 Shortness of breath: Secondary | ICD-10-CM | POA: Insufficient documentation

## 2023-03-31 DIAGNOSIS — J439 Emphysema, unspecified: Secondary | ICD-10-CM | POA: Diagnosis not present

## 2023-03-31 LAB — D-DIMER, QUANTITATIVE: D-Dimer, Quant: 0.93 ug{FEU}/mL — ABNORMAL HIGH (ref 0.00–0.50)

## 2023-04-06 ENCOUNTER — Other Ambulatory Visit: Payer: Self-pay | Admitting: Emergency Medicine

## 2023-04-06 ENCOUNTER — Ambulatory Visit: Admission: RE | Admit: 2023-04-06 | Payer: PPO | Source: Ambulatory Visit

## 2023-04-06 DIAGNOSIS — M7989 Other specified soft tissue disorders: Secondary | ICD-10-CM

## 2023-04-06 DIAGNOSIS — R7989 Other specified abnormal findings of blood chemistry: Secondary | ICD-10-CM

## 2023-04-06 DIAGNOSIS — J439 Emphysema, unspecified: Secondary | ICD-10-CM

## 2023-04-06 DIAGNOSIS — R0602 Shortness of breath: Secondary | ICD-10-CM

## 2023-04-06 DIAGNOSIS — M79604 Pain in right leg: Secondary | ICD-10-CM

## 2023-04-07 ENCOUNTER — Ambulatory Visit
Admission: RE | Admit: 2023-04-07 | Discharge: 2023-04-07 | Disposition: A | Discharge status: Home / Self Care | Payer: PPO | Source: Ambulatory Visit | Attending: Emergency Medicine | Admitting: Emergency Medicine

## 2023-04-07 ENCOUNTER — Ambulatory Visit
Admission: RE | Admit: 2023-04-07 | Discharge: 2023-04-07 | Disposition: A | Discharge status: Home / Self Care | Payer: PPO | Source: Ambulatory Visit | Attending: Emergency Medicine

## 2023-04-07 ENCOUNTER — Other Ambulatory Visit: Payer: Self-pay | Admitting: Emergency Medicine

## 2023-04-07 DIAGNOSIS — R0602 Shortness of breath: Secondary | ICD-10-CM

## 2023-04-07 DIAGNOSIS — R7989 Other specified abnormal findings of blood chemistry: Secondary | ICD-10-CM

## 2023-04-07 DIAGNOSIS — M7989 Other specified soft tissue disorders: Secondary | ICD-10-CM | POA: Insufficient documentation

## 2023-04-07 DIAGNOSIS — J439 Emphysema, unspecified: Secondary | ICD-10-CM | POA: Diagnosis not present

## 2023-04-07 DIAGNOSIS — M79604 Pain in right leg: Secondary | ICD-10-CM

## 2023-04-07 DIAGNOSIS — M79605 Pain in left leg: Secondary | ICD-10-CM | POA: Diagnosis not present

## 2023-04-07 DIAGNOSIS — C801 Malignant (primary) neoplasm, unspecified: Secondary | ICD-10-CM | POA: Insufficient documentation

## 2023-04-07 LAB — POCT I-STAT CREATININE: Creatinine, Ser: 2.4 mg/dL — ABNORMAL HIGH (ref 0.61–1.24)

## 2023-04-09 DIAGNOSIS — D0462 Carcinoma in situ of skin of left upper limb, including shoulder: Secondary | ICD-10-CM | POA: Diagnosis not present

## 2023-04-13 DIAGNOSIS — I5032 Chronic diastolic (congestive) heart failure: Secondary | ICD-10-CM | POA: Diagnosis not present

## 2023-04-13 DIAGNOSIS — Z950 Presence of cardiac pacemaker: Secondary | ICD-10-CM | POA: Diagnosis not present

## 2023-04-13 DIAGNOSIS — I495 Sick sinus syndrome: Secondary | ICD-10-CM | POA: Diagnosis not present

## 2023-04-13 DIAGNOSIS — G4733 Obstructive sleep apnea (adult) (pediatric): Secondary | ICD-10-CM | POA: Diagnosis not present

## 2023-04-13 DIAGNOSIS — I10 Essential (primary) hypertension: Secondary | ICD-10-CM | POA: Diagnosis not present

## 2023-04-14 DIAGNOSIS — Z85828 Personal history of other malignant neoplasm of skin: Secondary | ICD-10-CM | POA: Diagnosis not present

## 2023-04-14 DIAGNOSIS — L814 Other melanin hyperpigmentation: Secondary | ICD-10-CM | POA: Diagnosis not present

## 2023-04-14 DIAGNOSIS — D044 Carcinoma in situ of skin of scalp and neck: Secondary | ICD-10-CM | POA: Diagnosis not present

## 2023-04-14 DIAGNOSIS — L578 Other skin changes due to chronic exposure to nonionizing radiation: Secondary | ICD-10-CM | POA: Diagnosis not present

## 2023-04-14 DIAGNOSIS — L988 Other specified disorders of the skin and subcutaneous tissue: Secondary | ICD-10-CM | POA: Diagnosis not present

## 2023-04-22 DIAGNOSIS — J449 Chronic obstructive pulmonary disease, unspecified: Secondary | ICD-10-CM | POA: Diagnosis not present

## 2023-04-28 DIAGNOSIS — I495 Sick sinus syndrome: Secondary | ICD-10-CM | POA: Diagnosis not present

## 2023-04-28 DIAGNOSIS — Z950 Presence of cardiac pacemaker: Secondary | ICD-10-CM | POA: Diagnosis not present

## 2023-05-07 ENCOUNTER — Inpatient Hospital Stay: Payer: PPO

## 2023-05-07 ENCOUNTER — Inpatient Hospital Stay: Payer: PPO | Admitting: Internal Medicine

## 2023-05-07 ENCOUNTER — Inpatient Hospital Stay: Payer: PPO | Attending: Nurse Practitioner

## 2023-05-07 ENCOUNTER — Encounter: Payer: Self-pay | Admitting: Internal Medicine

## 2023-05-07 VITALS — BP 122/76 | HR 86 | Temp 96.1°F | Ht 69.0 in | Wt 177.6 lb

## 2023-05-07 DIAGNOSIS — N184 Chronic kidney disease, stage 4 (severe): Secondary | ICD-10-CM | POA: Diagnosis not present

## 2023-05-07 DIAGNOSIS — D693 Immune thrombocytopenic purpura: Secondary | ICD-10-CM

## 2023-05-07 DIAGNOSIS — I130 Hypertensive heart and chronic kidney disease with heart failure and stage 1 through stage 4 chronic kidney disease, or unspecified chronic kidney disease: Secondary | ICD-10-CM | POA: Insufficient documentation

## 2023-05-07 LAB — CBC WITH DIFFERENTIAL (CANCER CENTER ONLY)
Abs Immature Granulocytes: 0.05 10*3/uL (ref 0.00–0.07)
Basophils Absolute: 0.1 10*3/uL (ref 0.0–0.1)
Basophils Relative: 1 %
Eosinophils Absolute: 0.2 10*3/uL (ref 0.0–0.5)
Eosinophils Relative: 2 %
HCT: 43.8 % (ref 39.0–52.0)
Hemoglobin: 14.2 g/dL (ref 13.0–17.0)
Immature Granulocytes: 1 %
Lymphocytes Relative: 21 %
Lymphs Abs: 1.7 10*3/uL (ref 0.7–4.0)
MCH: 31.3 pg (ref 26.0–34.0)
MCHC: 32.4 g/dL (ref 30.0–36.0)
MCV: 96.7 fL (ref 80.0–100.0)
Monocytes Absolute: 0.7 10*3/uL (ref 0.1–1.0)
Monocytes Relative: 9 %
Neutro Abs: 5.5 10*3/uL (ref 1.7–7.7)
Neutrophils Relative %: 66 %
Platelet Count: 152 10*3/uL (ref 150–400)
RBC: 4.53 MIL/uL (ref 4.22–5.81)
RDW: 13.1 % (ref 11.5–15.5)
WBC Count: 8.2 10*3/uL (ref 4.0–10.5)
nRBC: 0 % (ref 0.0–0.2)

## 2023-05-07 LAB — IRON AND TIBC
Iron: 62 ug/dL (ref 45–182)
Saturation Ratios: 23 % (ref 17.9–39.5)
TIBC: 267 ug/dL (ref 250–450)
UIBC: 205 ug/dL

## 2023-05-07 LAB — BASIC METABOLIC PANEL
Anion gap: 8 (ref 5–15)
BUN: 27 mg/dL — ABNORMAL HIGH (ref 8–23)
CO2: 22 mmol/L (ref 22–32)
Calcium: 8.4 mg/dL — ABNORMAL LOW (ref 8.9–10.3)
Chloride: 110 mmol/L (ref 98–111)
Creatinine, Ser: 1.96 mg/dL — ABNORMAL HIGH (ref 0.61–1.24)
GFR, Estimated: 33 mL/min — ABNORMAL LOW (ref >60)
Glucose, Bld: 132 mg/dL — ABNORMAL HIGH (ref 70–99)
Potassium: 3.9 mmol/L (ref 3.5–5.1)
Sodium: 140 mmol/L (ref 135–145)

## 2023-05-07 LAB — FERRITIN: Ferritin: 105 ng/mL (ref 24–336)

## 2023-05-07 MED ORDER — TAMSULOSIN HCL 0.4 MG PO CAPS: 0.4000 mg | ORAL_CAPSULE | Freq: Every day | ORAL | 0 refills | Status: DC

## 2023-05-07 NOTE — Progress Notes (Signed)
Unable to completely empty bladder since nuclear scan. Feels like bladder is about to bust and nothing comes out and burning.  Had 2 skin cancers removed, [facility], lt arm and head.

## 2023-05-07 NOTE — Assessment & Plan Note (Addendum)
#  Acute platelets 35-40s [nadir Aug 2023].s/p dexamethasone 20 mg x 5 days and IVIG infusion 400 mg/m2 x 5.  # Platelets today greater than 154  Currently in remission.  Continue monitoring without any Nplate.  Plan Nplate-  If less than 100.   # [Feb 11, 2022] Acute DVT of left lower extremity- s/p  eliquis 5 mg twice daily d/t renal dysfunction. finished Eliquis toards end of December [4  months]. NOV 11th, 2023- subacute/chronic DVT- monitor for now- off eliquis. HOLD off any hypercoagulable work up. Discussed re: Compression stocking.   # Urinary rentention: [since nuc med x2 weeks]- no prior urologist/or prostate symtoms trial of flomax for 1 week; and stop mucinex. If not improved/or worse defer to Dr.MIller.   # CKD- experienced AKI on CKD stage 3b during hospitalization.stable.   # Chronic Diastolic Heart failure- preserved EF of 50-55%; SSS- pacemaker- ? COPD- stable.   # DISPOSITION:  # no N- plate today # follow up in 6 months- MD; labs- cbc/bmp; iron studies; ferritin-  possible N- plate;  Dr.B

## 2023-05-07 NOTE — Progress Notes (Signed)
[facility] CONSULT NOTE  Patient Care Team: Danella Penton, MD as PCP - General (Internal Medicine)  CHIEF COMPLAINTS/PURPOSE OF CONSULTATION: ITP  # Acute ITP [AUG 2023-]- hepatitis b workup was negative. Smear was negative for schiztocytes. No history of cirrhosis or splenomegaly but could consider ultrasound to evaluate further. Today, platelets continue to improve, now 100. Hold off on bone marrow biopsy at this time. dexamethasone 20 mg without improvement, increased to 40 mg. Received IVIG 400 mg/m2 x 5 days.   # Acute left lower extremity DVT in August 2023- on eliquis. VQ scan was performed due to chronic insufficiency which was negative for PE; eliquis 5 mg BID x stopped in dec 2023.    # CAD/CHF, chronic kidney disease stage IV.  SEP 2023- CT [non-contrast- Dr.Miller]  1. Mild pulmonary fibrosis in a pattern with apical to basal gradient, featuring irregular peripheral interstitial opacity, a preponderance of ground-glass, and subtle subpleural sparing at the lung bases. Fibrotic findings are significantly worsened compared to prior examination dated 10/24/2019. Findings are suggestive of an alternative diagnosis (not UIP) per consensus guidelines, leading differential consideration NSIP: Diagnosis of Idiopathic Pulmonary Fibrosis: An Official ATS/ERS/JRS/ALAT Clinical Practice Guideline. Am Rosezetta Schlatter Crit Care Med Vol 198, Iss 5, [phone], Feb 20 2017. 2. Small subpleural consolidative opacities, particularly in the peripheral right lower lobe, with some evidence of central clearing. This morphology carries a wide differential, generally including infectious/inflammatory etiology such as organizing pneumonia and fungal infection, although small acute pulmonary infarction secondary to pulmonary embolus can also have this appearance, particularly in subpleural locations. Consider CT PE angiogram to exclude pulmonary embolus if there is clinical concern based  on acute signs and symptoms. 3. Stable, definitively benign 0.3 cm nodule of the peripheral left upper lobe    HISTORY OF PRESENTING ILLNESS: Alone.  Ambulating independently. Darren Gonzales 87 y.o.  male ITP-status CHF CAD CKD; and history of left lower extremity DVT- off eliquis [stopped in dec 2024]  is here for follow-up.  Unable to completely empty bladder since nuclear scan.  Patient also started taking cold medication approximately weeks ago.  Patient has been small amounts of urine.  No blood.   Patient denies any bleeding problems.  Denies any falls.   Patient admits to improvement of his shortness of breath.  Intermittent pain in his left calf/thigh.  Otherwise no blood in stools or black or stools.  Review of Systems  Constitutional:  Negative for chills, diaphoresis, fever, malaise/fatigue and weight loss.  HENT:  Negative for nosebleeds and sore throat.   Eyes:  Negative for double vision.  Respiratory:  Positive for shortness of breath. Negative for cough, hemoptysis, sputum production and wheezing.   Cardiovascular:  Negative for chest pain, palpitations, orthopnea and leg swelling.  Gastrointestinal:  Negative for abdominal pain, blood in stool, constipation, diarrhea, heartburn, melena, nausea and vomiting.  Genitourinary:  Negative for dysuria, frequency and urgency.  Musculoskeletal:  Positive for back pain, joint pain and myalgias.  Skin: Negative.  Negative for itching and rash.  Neurological:  Negative for dizziness, tingling, focal weakness, weakness and headaches.  Endo/Heme/Allergies:  Does not bruise/bleed easily.  Psychiatric/Behavioral:  Negative for depression. The patient is not nervous/anxious and does not have insomnia.     MEDICAL HISTORY:  Past Medical History:  Diagnosis Date   Arthritis    Cancer (HCC)    COPD (chronic obstructive pulmonary disease) (HCC)    Hoarseness of voice    Hypertension  MRSA (methicillin resistant staph aureus)  culture positive    Sick sinus syndrome (HCC)    Sleep apnea    Tremor     SURGICAL HISTORY: Past Surgical History:  Procedure Laterality Date   CATARACT EXTRACTION     COLONOSCOPY WITH PROPOFOL N/A 01/15/2016   Procedure: COLONOSCOPY WITH PROPOFOL;  Surgeon: Scot Jun, MD;  Location: [facility] ENDOSCOPY;  Service: Endoscopy;  Laterality: N/A;   ESOPHAGOGASTRODUODENOSCOPY (EGD) WITH PROPOFOL N/A 01/15/2016   Procedure: ESOPHAGOGASTRODUODENOSCOPY (EGD) WITH PROPOFOL;  Surgeon: Scot Jun, MD;  Location: [facility] ENDOSCOPY;  Service: Endoscopy;  Laterality: N/A;   INSERT / REPLACE / REMOVE PACEMAKER     POSTERIOR LAMINECTOMY / DECOMPRESSION LUMBAR SPINE     PPM GENERATOR CHANGEOUT N/A 08/07/2021   Procedure: PPM GENERATOR CHANGEOUT;  Surgeon: Marcina Millard, MD;  Location: [facility] INVASIVE CV LAB;  Service: Cardiovascular;  Laterality: N/A;    SOCIAL HISTORY: Social History   Socioeconomic History   Marital status: Married    Spouse name: Not on file   Number of children: Not on file   Years of education: Not on file   Highest education level: Not on file  Occupational History   Not on file  Tobacco Use   Smoking status: Former   Smokeless tobacco: Never  Substance and Sexual Activity   Alcohol use: No   Drug use: No   Sexual activity: Not on file  Other Topics Concern   Not on file  Social History Narrative   Not on file   Social Determinants of Health   Financial Resource Strain: Low Risk  (03/29/2023)   Received from [facility]   Overall Financial Resource Strain (CARDIA)    Difficulty of Paying Living Expenses: Not hard at all  Food Insecurity: No Food Insecurity (03/29/2023)   Received from [facility]   Hunger Vital Sign    Worried About Running Out of Food in the Last Year: Never true    Ran Out of Food in the Last Year: Never true  Transportation Needs: No Transportation Needs (03/29/2023)   Received from [facility] - Transportation    In the past 12 months, has lack of transportation kept you from medical appointments or from getting medications?: No    Lack of Transportation (Non-Medical): No  Physical Activity: Not on file  Stress: Not on file  Social Connections: Not on file  Intimate Partner Violence: Not on file    FAMILY HISTORY: History reviewed. No pertinent family history.  ALLERGIES:  is allergic to gabapentin and lipitor [atorvastatin].  MEDICATIONS:  Current Outpatient Medications  Medication Sig Dispense Refill   acetaminophen (TYLENOL) 325 MG tablet Take 650 mg by mouth every 6 (six) hours as needed for mild pain, moderate pain, fever or headache.     aspirin 81 MG tablet Take 81 mg by mouth every other day.     Biotin 1 MG CAPS Take 1 mg by mouth daily.     Cholecalciferol 1000 units tablet Take 1,000 Units by mouth daily.     colchicine 0.6 MG tablet Take 0.6 mg by mouth daily as needed (Gout).     furosemide (LASIX) 20 MG tablet Take 20 mg by mouth daily. 1/2 tab QD prn     isosorbide mononitrate (IMDUR) 30 MG 24 hr tablet Take 30 mg by mouth daily.     montelukast (SINGULAIR) 10 MG tablet Take 10 mg by mouth at bedtime.  omeprazole (PRILOSEC) 40 MG capsule Take 40 mg by mouth every other day.     pramipexole (MIRAPEX) 0.125 MG tablet Take 0.125 mg by mouth in the morning and at bedtime.     propranolol ER (INDERAL LA) 60 MG 24 hr capsule Take 60 mg by mouth daily.     rosuvastatin (CRESTOR) 10 MG tablet Take 10 mg by mouth at bedtime.     tamsulosin (FLOMAX) 0.4 MG CAPS capsule Take 1 capsule (0.4 mg total) by mouth daily after supper. 7 capsule 0   amLODipine (NORVASC) 5 MG tablet Take 5 mg by mouth daily. (Patient not taking: Reported on 03/30/2022)     No current facility-administered medications for this visit.    PHYSICAL EXAMINATION:   Vitals:   05/07/23 1324  BP: 122/76  Pulse: 86  Temp: (!) 96.1 F (35.6 C)  SpO2: 99%     Filed Weights   05/07/23 1324  Weight: 177 lb 9.6 oz (80.6 kg)     Physical Exam Vitals and nursing note reviewed.  HENT:     Head: Normocephalic and atraumatic.     Mouth/Throat:     Pharynx: Oropharynx is clear.  Eyes:     Extraocular Movements: Extraocular movements intact.     Pupils: Pupils are equal, round, and reactive to light.  Cardiovascular:     Rate and Rhythm: Normal rate and regular rhythm.  Pulmonary:     Comments: Decreased breath sounds bilaterally.  Abdominal:     Palpations: Abdomen is soft.  Musculoskeletal:        General: Normal range of motion.     Cervical back: Normal range of motion.  Skin:    General: Skin is warm.  Neurological:     General: No focal deficit present.     Mental Status: He is alert and oriented to person, place, and time.  Psychiatric:        Behavior: Behavior normal.        Judgment: Judgment normal.     LABORATORY DATA:  I have reviewed the data as listed Lab Results  Component Value Date   WBC 8.2 05/07/2023   HGB 14.2 05/07/2023   HCT 43.8 05/07/2023   MCV 96.7 05/07/2023   PLT 152 05/07/2023   Recent Labs    07/07/22 1407 11/04/22 1416 04/07/23 0841 05/07/23 1317  NA 139 140  --  140  K 4.1 3.5  --  3.9  CL 106 106  --  110  CO2 24 24  --  22  GLUCOSE 100* 102*  --  132*  BUN 18 27*  --  27*  CREATININE 1.59* 1.75* 2.40* 1.96*  CALCIUM 8.3* 8.9  --  8.4*  GFRNONAA 43* 38*  --  33*    RADIOGRAPHIC STUDIES: I have personally reviewed the radiological images as listed and agreed with the findings in the report. No results found.   Acute ITP (HCC) #Acute platelets 35-40s [nadir Aug 2023].s/p dexamethasone 20 mg x 5 days and IVIG infusion 400 mg/m2 x 5.  # Platelets today greater than 154  Currently in remission.  Continue monitoring without any Nplate.  Plan Nplate-  If less than 100.   # [Feb 11, 2022] Acute DVT of left lower extremity- s/p  eliquis 5 mg twice daily d/t renal dysfunction.  finished Eliquis toards end of December [4  months]. NOV 11th, 2023- subacute/chronic DVT- monitor for now- off eliquis. HOLD off any hypercoagulable work up. Discussed re: Compression stocking.   #  Urinary rentention: [since nuc med x2 weeks]- no prior urologist/or prostate symtoms trial of flomax for 1 week; and stop mucinex. If not improved/or worse defer to Dr.MIller.   # CKD- experienced AKI on CKD stage 3b during hospitalization.stable.   # Chronic Diastolic Heart failure- preserved EF of 50-55%; SSS- pacemaker- ? COPD- stable.   # DISPOSITION:  # no N- plate today # follow up in 6 months- MD; labs- cbc/bmp; iron studies; ferritin-  possible N- plate;  Dr.B    Above plan of care was discussed with patient/family in detail.  My contact information was given to the patient/family.     Earna Coder, MD 05/07/2023 2:11 PM

## 2023-05-31 DIAGNOSIS — I5032 Chronic diastolic (congestive) heart failure: Secondary | ICD-10-CM | POA: Diagnosis not present

## 2023-05-31 DIAGNOSIS — M542 Cervicalgia: Secondary | ICD-10-CM | POA: Diagnosis not present

## 2023-05-31 DIAGNOSIS — N184 Chronic kidney disease, stage 4 (severe): Secondary | ICD-10-CM | POA: Diagnosis not present

## 2023-06-17 DIAGNOSIS — M5416 Radiculopathy, lumbar region: Secondary | ICD-10-CM | POA: Diagnosis not present

## 2023-06-17 DIAGNOSIS — M519 Unspecified thoracic, thoracolumbar and lumbosacral intervertebral disc disorder: Secondary | ICD-10-CM | POA: Diagnosis not present

## 2023-06-17 DIAGNOSIS — R350 Frequency of micturition: Secondary | ICD-10-CM | POA: Diagnosis not present

## 2023-07-16 DIAGNOSIS — I5032 Chronic diastolic (congestive) heart failure: Secondary | ICD-10-CM | POA: Diagnosis not present

## 2023-07-16 DIAGNOSIS — R739 Hyperglycemia, unspecified: Secondary | ICD-10-CM | POA: Diagnosis not present

## 2023-07-16 DIAGNOSIS — E538 Deficiency of other specified B group vitamins: Secondary | ICD-10-CM | POA: Diagnosis not present

## 2023-07-21 DIAGNOSIS — D485 Neoplasm of uncertain behavior of skin: Secondary | ICD-10-CM | POA: Diagnosis not present

## 2023-07-21 DIAGNOSIS — Z125 Encounter for screening for malignant neoplasm of prostate: Secondary | ICD-10-CM | POA: Diagnosis not present

## 2023-07-21 DIAGNOSIS — E538 Deficiency of other specified B group vitamins: Secondary | ICD-10-CM | POA: Diagnosis not present

## 2023-07-21 DIAGNOSIS — E782 Mixed hyperlipidemia: Secondary | ICD-10-CM | POA: Diagnosis not present

## 2023-07-21 DIAGNOSIS — N184 Chronic kidney disease, stage 4 (severe): Secondary | ICD-10-CM | POA: Diagnosis not present

## 2023-07-21 DIAGNOSIS — I5032 Chronic diastolic (congestive) heart failure: Secondary | ICD-10-CM | POA: Diagnosis not present

## 2023-07-21 DIAGNOSIS — L570 Actinic keratosis: Secondary | ICD-10-CM | POA: Diagnosis not present

## 2023-07-21 DIAGNOSIS — R739 Hyperglycemia, unspecified: Secondary | ICD-10-CM | POA: Diagnosis not present

## 2023-07-21 DIAGNOSIS — I700 Atherosclerosis of aorta: Secondary | ICD-10-CM | POA: Diagnosis not present

## 2023-07-21 DIAGNOSIS — R634 Abnormal weight loss: Secondary | ICD-10-CM | POA: Diagnosis not present

## 2023-07-21 DIAGNOSIS — N320 Bladder-neck obstruction: Secondary | ICD-10-CM | POA: Diagnosis not present

## 2023-07-21 DIAGNOSIS — Z0000 Encounter for general adult medical examination without abnormal findings: Secondary | ICD-10-CM | POA: Diagnosis not present

## 2023-07-21 DIAGNOSIS — L820 Inflamed seborrheic keratosis: Secondary | ICD-10-CM | POA: Diagnosis not present

## 2023-07-21 DIAGNOSIS — J432 Centrilobular emphysema: Secondary | ICD-10-CM | POA: Diagnosis not present

## 2023-07-23 ENCOUNTER — Other Ambulatory Visit: Payer: Self-pay | Admitting: Internal Medicine

## 2023-07-23 DIAGNOSIS — R634 Abnormal weight loss: Secondary | ICD-10-CM

## 2023-07-23 DIAGNOSIS — N320 Bladder-neck obstruction: Secondary | ICD-10-CM

## 2023-07-28 ENCOUNTER — Encounter: Payer: Self-pay | Admitting: Internal Medicine

## 2023-07-28 ENCOUNTER — Ambulatory Visit
Admission: RE | Admit: 2023-07-28 | Discharge: 2023-07-28 | Disposition: A | Discharge status: Home / Self Care | Payer: PPO | Source: Ambulatory Visit | Attending: Internal Medicine | Admitting: Internal Medicine

## 2023-07-28 DIAGNOSIS — N320 Bladder-neck obstruction: Secondary | ICD-10-CM | POA: Insufficient documentation

## 2023-07-28 DIAGNOSIS — R634 Abnormal weight loss: Secondary | ICD-10-CM | POA: Insufficient documentation

## 2023-07-28 DIAGNOSIS — K7689 Other specified diseases of liver: Secondary | ICD-10-CM | POA: Diagnosis not present

## 2023-07-28 DIAGNOSIS — N281 Cyst of kidney, acquired: Secondary | ICD-10-CM | POA: Diagnosis not present

## 2023-07-28 DIAGNOSIS — K5730 Diverticulosis of large intestine without perforation or abscess without bleeding: Secondary | ICD-10-CM | POA: Diagnosis not present

## 2023-08-11 DIAGNOSIS — R634 Abnormal weight loss: Secondary | ICD-10-CM | POA: Diagnosis not present

## 2023-08-11 DIAGNOSIS — N320 Bladder-neck obstruction: Secondary | ICD-10-CM | POA: Diagnosis not present

## 2023-08-17 ENCOUNTER — Ambulatory Visit: Payer: Self-pay | Admitting: Urology

## 2023-09-21 DIAGNOSIS — J4489 Other specified chronic obstructive pulmonary disease: Secondary | ICD-10-CM | POA: Diagnosis not present

## 2023-09-28 DIAGNOSIS — I495 Sick sinus syndrome: Secondary | ICD-10-CM | POA: Diagnosis not present

## 2023-11-01 DIAGNOSIS — N184 Chronic kidney disease, stage 4 (severe): Secondary | ICD-10-CM | POA: Diagnosis not present

## 2023-11-01 DIAGNOSIS — I5032 Chronic diastolic (congestive) heart failure: Secondary | ICD-10-CM | POA: Diagnosis not present

## 2023-11-01 DIAGNOSIS — J40 Bronchitis, not specified as acute or chronic: Secondary | ICD-10-CM | POA: Diagnosis not present

## 2023-11-04 ENCOUNTER — Inpatient Hospital Stay: Payer: PPO | Admitting: Internal Medicine

## 2023-11-04 ENCOUNTER — Telehealth: Payer: Self-pay | Admitting: Internal Medicine

## 2023-11-04 ENCOUNTER — Inpatient Hospital Stay: Payer: PPO

## 2023-11-04 NOTE — Telephone Encounter (Signed)
 Called patient to check on him since he didn't show for his appointment- He states Dr. Annabell Key is keeping a check on him and he doesn't need this appointment. Appointments cancelled as requested

## 2024-01-18 DIAGNOSIS — R739 Hyperglycemia, unspecified: Secondary | ICD-10-CM | POA: Diagnosis not present

## 2024-01-18 DIAGNOSIS — E782 Mixed hyperlipidemia: Secondary | ICD-10-CM | POA: Diagnosis not present

## 2024-01-18 DIAGNOSIS — E538 Deficiency of other specified B group vitamins: Secondary | ICD-10-CM | POA: Diagnosis not present

## 2024-01-18 DIAGNOSIS — Z125 Encounter for screening for malignant neoplasm of prostate: Secondary | ICD-10-CM | POA: Diagnosis not present

## 2024-01-18 DIAGNOSIS — N184 Chronic kidney disease, stage 4 (severe): Secondary | ICD-10-CM | POA: Diagnosis not present

## 2024-01-21 DIAGNOSIS — C44321 Squamous cell carcinoma of skin of nose: Secondary | ICD-10-CM | POA: Diagnosis not present

## 2024-01-21 DIAGNOSIS — D2262 Melanocytic nevi of left upper limb, including shoulder: Secondary | ICD-10-CM | POA: Diagnosis not present

## 2024-01-21 DIAGNOSIS — D2272 Melanocytic nevi of left lower limb, including hip: Secondary | ICD-10-CM | POA: Diagnosis not present

## 2024-01-21 DIAGNOSIS — L570 Actinic keratosis: Secondary | ICD-10-CM | POA: Diagnosis not present

## 2024-01-21 DIAGNOSIS — D2261 Melanocytic nevi of right upper limb, including shoulder: Secondary | ICD-10-CM | POA: Diagnosis not present

## 2024-01-21 DIAGNOSIS — D485 Neoplasm of uncertain behavior of skin: Secondary | ICD-10-CM | POA: Diagnosis not present

## 2024-01-21 DIAGNOSIS — D225 Melanocytic nevi of trunk: Secondary | ICD-10-CM | POA: Diagnosis not present

## 2024-01-25 DIAGNOSIS — J431 Panlobular emphysema: Secondary | ICD-10-CM | POA: Diagnosis not present

## 2024-01-25 DIAGNOSIS — D696 Thrombocytopenia, unspecified: Secondary | ICD-10-CM | POA: Diagnosis not present

## 2024-01-25 DIAGNOSIS — Z1331 Encounter for screening for depression: Secondary | ICD-10-CM | POA: Diagnosis not present

## 2024-01-25 DIAGNOSIS — Z0000 Encounter for general adult medical examination without abnormal findings: Secondary | ICD-10-CM | POA: Diagnosis not present

## 2024-01-25 DIAGNOSIS — E538 Deficiency of other specified B group vitamins: Secondary | ICD-10-CM | POA: Diagnosis not present

## 2024-01-25 DIAGNOSIS — N184 Chronic kidney disease, stage 4 (severe): Secondary | ICD-10-CM | POA: Diagnosis not present

## 2024-01-25 DIAGNOSIS — G4733 Obstructive sleep apnea (adult) (pediatric): Secondary | ICD-10-CM | POA: Diagnosis not present

## 2024-01-25 DIAGNOSIS — R739 Hyperglycemia, unspecified: Secondary | ICD-10-CM | POA: Diagnosis not present

## 2024-01-25 DIAGNOSIS — I5032 Chronic diastolic (congestive) heart failure: Secondary | ICD-10-CM | POA: Diagnosis not present

## 2024-01-25 DIAGNOSIS — E782 Mixed hyperlipidemia: Secondary | ICD-10-CM | POA: Diagnosis not present

## 2024-03-03 DIAGNOSIS — H6123 Impacted cerumen, bilateral: Secondary | ICD-10-CM | POA: Diagnosis not present

## 2024-03-03 DIAGNOSIS — H903 Sensorineural hearing loss, bilateral: Secondary | ICD-10-CM | POA: Diagnosis not present

## 2024-03-08 DIAGNOSIS — I495 Sick sinus syndrome: Secondary | ICD-10-CM | POA: Diagnosis not present

## 2024-03-13 DIAGNOSIS — M5116 Intervertebral disc disorders with radiculopathy, lumbar region: Secondary | ICD-10-CM | POA: Diagnosis not present

## 2024-03-13 DIAGNOSIS — N184 Chronic kidney disease, stage 4 (severe): Secondary | ICD-10-CM | POA: Diagnosis not present

## 2024-03-22 DIAGNOSIS — N184 Chronic kidney disease, stage 4 (severe): Secondary | ICD-10-CM | POA: Diagnosis not present

## 2024-03-22 DIAGNOSIS — M5116 Intervertebral disc disorders with radiculopathy, lumbar region: Secondary | ICD-10-CM | POA: Diagnosis not present

## 2024-03-22 NOTE — Progress Notes (Signed)
 Patient Profile:   Darren Gonzales  is a 88 y.o.  male Chief Complaint  Patient presents with  . Hip Pain    Patient complains of ongoing left hip and knee pain.       PROBLEM LIST: Past Medical History:  Diagnosis Date  . Artificial cardiac pacemaker   . Chickenpox   . Coronary artery disease   . DVT (deep venous thrombosis) (CMS/HHS-HCC)   . History of adenomatous polyp of colon 11/14/2015  . Hypertension   . MRSA (methicillin resistant Staphylococcus aureus)   . Sick sinus syndrome (CMS/HHS-HCC)   . Sleep apnea    mild  . Thrombocytopenia ()     Past Surgical History:  Procedure Laterality Date  . COLONOSCOPY  06/06/1999   Hyperplastic Polyps  . COLONOSCOPY  03/07/2009   03/19/2004; Adenomatous Polyp: CBF 02/2014  . COLONOSCOPY  01/15/2016   Adenomatous Polyps: CBF 12/2018  . EGD  01/15/2016   Gastritis, Esophagitis: No repeat per RTE  . SKIN CANCER REMOVED Left 08/2017   LEFT CHEEK  . OTHER SURGERY Right 07/2019   excision right hand skin cancer  . skin cance removed Left 12/06/2020  . CATARACT EXTRACTION Bilateral   . EGD  03/19/2004, 01/29/1997   No repeat per RTE  . frozen shoulder Left   . INSERT / REPLACE / REMOVE PACEMAKER    . POSTERIOR LAMINECTOMY / DECOMPRESSION LUMBAR SPINE N/A 2007, 2012  . TRANSVENOUS INSERTION PACEMAKER DUAL CHAMBER LEADS N/A     ALLERGIES: Allergies  Allergen Reactions  . Breztri Aerosphere [Budesonide-Glycopyr-Formoterol] Other (See Comments)    Patient reports choking, unable to swallow and vomiting white foam  . Lipitor [Atorvastatin] Muscle Pain    CURRENT MEDICATIONS: Current Outpatient Medications  Medication Sig Dispense Refill  . acetaminophen  (TYLENOL ) 650 MG ER tablet Take 650 mg by mouth every 8 (eight) hours as needed    . biotin 1 mg Cap Take 1 mg by mouth once daily.      . celecoxib (CELEBREX) 100 MG capsule Take 1 capsule (100 mg total) by mouth 2 (two) times daily  for 30 days 60 capsule 0  . cholecalciferol (VITAMIN D3) 1,000 unit capsule Take 1,000 Units by mouth once daily.    . colchicine (COLCRYS) 0.6 mg tablet Take 1 tablet (0.6 mg total) by mouth once daily 90 tablet 3  . FUROsemide (LASIX) 20 MG tablet Take 0.5 tablets (10 mg total) by mouth once daily as needed 90 tablet 3  . isosorbide  mononitrate (IMDUR ) 30 MG ER tablet Take 1 tablet (30 mg total) by mouth once daily 90 tablet 3  . montelukast (SINGULAIR) 10 mg tablet Take 1 tablet (10 mg total) by mouth once daily 90 tablet 3  . omeprazole (PRILOSEC) 40 MG DR capsule Take 1 capsule (40 mg total) by mouth once daily As needed 90 capsule 3  . pramipexole (MIRAPEX) 0.25 MG tablet Take 1 tablet (0.25 mg total) by mouth 3 (three) times daily 270 tablet 3  . propranoloL  (INDERAL  LA) 60 MG LA capsule Take 1 capsule (60 mg total) by mouth once daily 90 capsule 3  . rosuvastatin  (CRESTOR ) 10 MG tablet Take 1 tablet (10 mg total) by mouth once daily Stop Pravastatin 90 tablet 3  . traZODone (DESYREL) 50 MG tablet Take 2 tablets (100 mg total) by mouth at bedtime  180 tablet 3  . gabapentin (NEURONTIN) 100 MG capsule 1 tab a.m., 1-2 tabs at bedtime 60 capsule 1  . traMADoL (ULTRAM) 50 mg tablet Take 1 tablet (50 mg total) by mouth 3 (three) times daily as needed for Pain for up to 20 days 40 tablet 0   No current facility-administered medications for this visit.      HPI   CLINICAL SUMMARY:  Patient continues with significant left anterior thigh sciatica.  History of lumbar disc surgery x 2, last 2012.  CT historically has shown L4-5 lumbar spinal stenosis.  Pacemaker in place.  Patient has not really responded significantly to piriformis shot nor oral steroids nor Celebrex.  Not able to sleep, sleeping in the recliner.  CKD 3 with creatinine 1.7, no leg weakness  ROS: Review of systems is unremarkable for any active cardiac, respiratory, GI, GU, hematologic, neurologic, dermatologic, HEENT, or  psychiatric symptoms except as noted above, 10 systems reviewed.  No fevers, chills, or constitutional symptoms.   PHYSICAL EXAM  Vital signs:  BP (!) 172/100   Pulse 89   Wt 79.7 kg (175 lb 9.6 oz)   SpO2 99%   BMI 27.10 kg/m  Body mass index is 27.1 kg/m.   Wt Readings from Last 3 Encounters:  03/22/24 79.7 kg (175 lb 9.6 oz)  03/13/24 80.6 kg (177 lb 12.8 oz)  01/25/24 80.4 kg (177 lb 3.2 oz)     BP Readings from Last 3 Encounters:  03/22/24 (!) 172/100  03/13/24 (!) 140/80  01/25/24 (!) 140/80    Constitutional:NAD Neck: supple, no thyromegaly, good ROM Respiratory:clear to auscultation, no rales or wheezes Cardiovascular:RRR, no murmur or gallop Abdominal:soft, good BS, NT Ext: no edema, good peripheral pulses Neuro: alert and oriented X 3, grossly nonfocal     ASSESSMENT/PLAN   Left sciatica-very symptomatic, no improvement with steroids nor short span of Celebrex.  History of L4-5 spinal stenosis on CT, postop x 2, symptoms x 3 weeks after lifting a heavy beam Go to gabapentin 1 mg 1 tab a.m., 1-2 tabs at bedtime, watch for sedation, add tramadol during the day 3 times daily as needed pain, watching for side effects. Continue Celebrex 100 mg twice daily, off steroids, x-ray CKD 3-watch renal function  10-day reevaluation, plan CT at that time, cannot do MRI with pacemaker, postop x 2  Dispo:   Return in about 10 days (around 04/01/2024) for followup.

## 2024-03-26 ENCOUNTER — Encounter (HOSPITAL_COMMUNITY): Payer: Self-pay

## 2024-03-26 ENCOUNTER — Encounter: Payer: Self-pay | Admitting: Emergency Medicine

## 2024-03-26 ENCOUNTER — Emergency Department

## 2024-03-26 ENCOUNTER — Inpatient Hospital Stay (HOSPITAL_COMMUNITY): Admit: 2024-03-26 | Admitting: Internal Medicine

## 2024-03-26 ENCOUNTER — Other Ambulatory Visit: Payer: Self-pay

## 2024-03-26 ENCOUNTER — Emergency Department
Admission: EM | Admit: 2024-03-26 | Discharge: 2024-03-27 | Attending: Emergency Medicine | Admitting: Emergency Medicine

## 2024-03-26 DIAGNOSIS — W19XXXA Unspecified fall, initial encounter: Secondary | ICD-10-CM

## 2024-03-26 DIAGNOSIS — Z7982 Long term (current) use of aspirin: Secondary | ICD-10-CM | POA: Insufficient documentation

## 2024-03-26 DIAGNOSIS — I2510 Atherosclerotic heart disease of native coronary artery without angina pectoris: Secondary | ICD-10-CM | POA: Diagnosis not present

## 2024-03-26 DIAGNOSIS — Z043 Encounter for examination and observation following other accident: Secondary | ICD-10-CM | POA: Diagnosis not present

## 2024-03-26 DIAGNOSIS — J4489 Other specified chronic obstructive pulmonary disease: Secondary | ICD-10-CM | POA: Diagnosis not present

## 2024-03-26 DIAGNOSIS — N1832 Chronic kidney disease, stage 3b: Secondary | ICD-10-CM | POA: Diagnosis not present

## 2024-03-26 DIAGNOSIS — Z950 Presence of cardiac pacemaker: Secondary | ICD-10-CM | POA: Diagnosis not present

## 2024-03-26 DIAGNOSIS — S199XXA Unspecified injury of neck, initial encounter: Secondary | ICD-10-CM | POA: Diagnosis not present

## 2024-03-26 DIAGNOSIS — I5032 Chronic diastolic (congestive) heart failure: Secondary | ICD-10-CM | POA: Diagnosis not present

## 2024-03-26 DIAGNOSIS — W11XXXA Fall on and from ladder, initial encounter: Secondary | ICD-10-CM | POA: Diagnosis not present

## 2024-03-26 DIAGNOSIS — S82201A Unspecified fracture of shaft of right tibia, initial encounter for closed fracture: Secondary | ICD-10-CM | POA: Diagnosis not present

## 2024-03-26 DIAGNOSIS — S82141A Displaced bicondylar fracture of right tibia, initial encounter for closed fracture: Secondary | ICD-10-CM | POA: Insufficient documentation

## 2024-03-26 DIAGNOSIS — S51812A Laceration without foreign body of left forearm, initial encounter: Secondary | ICD-10-CM | POA: Insufficient documentation

## 2024-03-26 DIAGNOSIS — S0990XA Unspecified injury of head, initial encounter: Secondary | ICD-10-CM | POA: Diagnosis not present

## 2024-03-26 DIAGNOSIS — S8991XA Unspecified injury of right lower leg, initial encounter: Secondary | ICD-10-CM | POA: Diagnosis present

## 2024-03-26 LAB — CBC WITH DIFFERENTIAL/PLATELET
Abs Immature Granulocytes: 0.27 K/uL — ABNORMAL HIGH (ref 0.00–0.07)
Basophils Absolute: 0.1 K/uL (ref 0.0–0.1)
Basophils Relative: 0 %
Eosinophils Absolute: 0.3 K/uL (ref 0.0–0.5)
Eosinophils Relative: 2 %
HCT: 44.9 % (ref 39.0–52.0)
Hemoglobin: 15 g/dL (ref 13.0–17.0)
Immature Granulocytes: 1 %
Lymphocytes Relative: 6 %
Lymphs Abs: 1.3 K/uL (ref 0.7–4.0)
MCH: 31.7 pg (ref 26.0–34.0)
MCHC: 33.4 g/dL (ref 30.0–36.0)
MCV: 94.9 fL (ref 80.0–100.0)
Monocytes Absolute: 1.3 K/uL — ABNORMAL HIGH (ref 0.1–1.0)
Monocytes Relative: 6 %
Neutro Abs: 17.2 K/uL — ABNORMAL HIGH (ref 1.7–7.7)
Neutrophils Relative %: 85 %
Platelets: 126 K/uL — ABNORMAL LOW (ref 150–400)
RBC: 4.73 MIL/uL (ref 4.22–5.81)
RDW: 13.5 % (ref 11.5–15.5)
WBC: 20.5 K/uL — ABNORMAL HIGH (ref 4.0–10.5)
nRBC: 0 % (ref 0.0–0.2)

## 2024-03-26 LAB — COMPREHENSIVE METABOLIC PANEL WITH GFR
ALT: 47 U/L — ABNORMAL HIGH (ref 0–44)
AST: 35 U/L (ref 15–41)
Albumin: 3.3 g/dL — ABNORMAL LOW (ref 3.5–5.0)
Alkaline Phosphatase: 51 U/L (ref 38–126)
Anion gap: 11 (ref 5–15)
BUN: 60 mg/dL — ABNORMAL HIGH (ref 8–23)
CO2: 21 mmol/L — ABNORMAL LOW (ref 22–32)
Calcium: 8.3 mg/dL — ABNORMAL LOW (ref 8.9–10.3)
Chloride: 109 mmol/L (ref 98–111)
Creatinine, Ser: 2.12 mg/dL — ABNORMAL HIGH (ref 0.61–1.24)
GFR, Estimated: 30 mL/min — ABNORMAL LOW (ref >60)
Glucose, Bld: 95 mg/dL (ref 70–99)
Potassium: 4.7 mmol/L (ref 3.5–5.1)
Sodium: 141 mmol/L (ref 135–145)
Total Bilirubin: 1.4 mg/dL — ABNORMAL HIGH (ref 0.0–1.2)
Total Protein: 5.8 g/dL — ABNORMAL LOW (ref 6.5–8.1)

## 2024-03-26 MED ORDER — ONDANSETRON HCL 4 MG/2ML IJ SOLN
4.0000 mg | Freq: Once | INTRAMUSCULAR | Status: AC
  Administered 2024-03-26: 4 mg via INTRAVENOUS
  Filled 2024-03-26: qty 2

## 2024-03-26 MED ORDER — MORPHINE SULFATE (PF) 4 MG/ML IV SOLN
4.0000 mg | Freq: Once | INTRAVENOUS | Status: AC
  Administered 2024-03-26: 4 mg via INTRAVENOUS
  Filled 2024-03-26: qty 1

## 2024-03-26 MED ORDER — ACETAMINOPHEN 325 MG PO TABS
650.0000 mg | ORAL_TABLET | Freq: Once | ORAL | Status: AC
Start: 2024-03-26 — End: 2024-03-26
  Administered 2024-03-26: 650 mg via ORAL
  Filled 2024-03-26: qty 2

## 2024-03-26 MED ORDER — GABAPENTIN 100 MG PO CAPS
100.0000 mg | ORAL_CAPSULE | Freq: Once | ORAL | Status: AC
  Administered 2024-03-26: 100 mg via ORAL
  Filled 2024-03-26: qty 1

## 2024-03-26 MED ORDER — TRAMADOL HCL 50 MG PO TABS
50.0000 mg | ORAL_TABLET | Freq: Once | ORAL | Status: AC
  Administered 2024-03-26: 50 mg via ORAL
  Filled 2024-03-26: qty 1

## 2024-03-26 NOTE — ED Notes (Signed)
 Pt gone to imaging

## 2024-03-26 NOTE — ED Notes (Signed)
 RN to bedside to introduce self to pt. Pt is caox4, in no acute distress. Pt advised he was up on the ladder hanging a bug zapper and his foot got caught in the drop cord and kicked him backwards. Pt landed on ground in the dirt on his rear end. Pt denies hitting his head. Pt has no pain upon palpation in Cspine or down spine.

## 2024-03-26 NOTE — ED Provider Notes (Signed)
 [facility] Provider Note    Event Date/Time   First MD Initiated Contact with Patient 03/26/24 2021     (approximate)   History   Fall    HPI  Darren Gonzales is a 88 y.o. male    with a past medical history of lumbar disc disease CKD, hyperlipidemia, bronchitis, bladder obstruction, radiculopathy, pacemaker, COPD with asthma, who presents to the ED complaining of a fall. According to the patient, he was on a ladder and felt about 6 feet high.  Patient denies loss of consciousness.  Patient is taking baby aspirin  every other day.  Patient complains of pain on his right knee, unable to walk after he felt.  Patient is here with his sons.     Patient Active Problem List   Diagnosis Date Noted   Cancer [facility])    COPD (chronic obstructive pulmonary disease) (HCC) 02/12/2022   Acute kidney injury superimposed on chronic kidney disease stage 3b (HCC) 02/12/2022   Gout 02/12/2022   Acute ITP (HCC) 02/12/2022   Malnutrition of moderate degree 02/12/2022   Shortness of breath 02/12/2022   DVT (deep venous thrombosis) (HCC) 02/11/2022   S/P placement of cardiac pacemaker 11/26/2021   Sinoatrial node dysfunction (HCC) 12/10/2020   Lumbar disc disease 05/09/2018   Chronic diastolic CHF (congestive heart failure), NYHA class 3 (HCC) 06/01/2017   Aortic atherosclerosis 05/25/2017   History of MRSA infection 08/14/2016   Chronic reflux esophagitis 05/05/2016   Tubular adenoma 05/05/2016   Stage 3b chronic kidney disease (CKD) (HCC) 05/02/2015   Benign essential tremor 02/08/2014   Mixed hyperlipidemia 01/22/2014   Coronary heart disease 11/06/2013   Essential hypertension 11/06/2013   OSA (obstructive sleep apnea) 10/01/2013   ADHESIVE CAPSULITIS, LEFT 06/28/2008   Disorder of bursae and tendons in shoulder region 06/28/2008     ROS: Patient currently denies any vision changes, tinnitus, difficulty speaking, facial droop, sore throat, chest pain, shortness  of breath, abdominal pain, nausea/vomiting/diarrhea, dysuria, or weakness/numbness/paresthesias in any extremity   Physical Exam   Triage Vital Signs: ED Triage Vitals  Encounter Vitals Group     BP 03/26/24 1925 (!) 156/87     Girls Systolic BP Percentile --      Girls Diastolic BP Percentile --      Boys Systolic BP Percentile --      Boys Diastolic BP Percentile --      Pulse Rate 03/26/24 1925 69     Resp 03/26/24 1925 18     Temp 03/26/24 1925 98.6 F (37 C)     Temp Source 03/26/24 1925 Oral     SpO2 03/26/24 1925 100 %     Weight 03/26/24 1930 177 lb (80.3 kg)     Height 03/26/24 1930 5' 9 (1.753 m)     Head Circumference --      Peak Flow --      Pain Score 03/26/24 1930 5     Pain Loc --      Pain Education --      Exclude from Growth Chart --     Most recent vital signs: Vitals:   03/26/24 1925 03/26/24 2226  BP: (!) 156/87 (!) 145/94  Pulse: 69 77  Resp: 18 18  Temp: 98.6 F (37 C) 98.6 F (37 C)  SpO2: 100% 99%     Physical Exam Vitals and nursing note reviewed.  During triage patient was hypertensive  General:  Awake, no distress. Head:   No signs of trauma, no tenderness to palpation. CV:                  Good peripheral perfusion.  Resp:               Normal effort. no tachypnea Abd:                 No distention.  Soft nontender Other:              Right forearm: Tear of the skin in the volar side in the proximal radial area. Left forearm: Tear of the skin in the volar side in the middle third of the radial area Right knee: Skin is intact, no ecchymosis no hematomas.  Edema in the prepatellar area.  Tender to palpation in the lateral patellar area.  Flexion and extension limited by pain.  ED Results / Procedures / Treatments   Labs (all labs ordered are listed, but only abnormal results are displayed) Labs Reviewed  CBC WITH DIFFERENTIAL/PLATELET - Abnormal; Notable for the following components:      Result Value   WBC 20.5 (*)     Platelets 126 (*)    Neutro Abs 17.2 (*)    Monocytes Absolute 1.3 (*)    Abs Immature Granulocytes 0.27 (*)    All other components within normal limits  COMPREHENSIVE METABOLIC PANEL WITH GFR        RADIOLOGY I independently reviewed and interpreted imaging and agree with radiologists findings.      PROCEDURES:  Critical Care performed:   .Laceration Repair  Date/Time: 03/26/2024 9:07 PM  Performed by: Janit Kast, PA-C Authorized by: Janit Kast, PA-C   Consent:    Consent obtained:  Verbal   Consent given by:  Patient   Risks discussed:  Infection and pain Universal protocol:    Procedure explained and questions answered to patient or proxy's satisfaction: yes     Patient identity confirmed:  Verbally with patient Anesthesia:    Anesthesia method:  None Laceration details:    Location:  Shoulder/arm   Shoulder/arm location:  R upper arm (Left forearm)   Length (cm):  3   Depth (mm):  1 Exploration:    Limited defect created (wound extended): no     Imaging obtained: x-ray     Imaging outcome: foreign body not noted   Treatment:    Area cleansed with:  Chlorhexidine    Amount of cleaning:  Standard   Irrigation solution:  Sterile saline   Irrigation volume:  200   Irrigation method:  Tap   Visualized foreign bodies/material removed: no     Debridement:  None   Undermining:  None Repair type:    Repair type:  Simple Post-procedure details:    Dressing:  Non-adherent dressing   Procedure completion:  Tolerated well, no immediate complications    MEDICATIONS ORDERED IN ED: Medications  traMADol (ULTRAM) tablet 50 mg (50 mg Oral Given 03/26/24 2143)  acetaminophen  (TYLENOL ) tablet 650 mg (650 mg Oral Given 03/26/24 2144)  gabapentin (NEURONTIN) capsule 100 mg (100 mg Oral Given 03/26/24 2143)  morphine (PF) 4 MG/ML injection 4 mg (4 mg Intravenous Given 03/26/24 2227)  ondansetron  (ZOFRAN ) injection 4 mg (4 mg Intravenous Given 03/26/24 2228)    Clinical Course as of 03/26/24 2323  Sun Mar 26, 2024  2038 CT Head Wo Contrast  No acute fracture or traumatic malalignment of the cervical spine. 2. No acute intracranial  abnormality. 3. Multilevel cervical degenerative disc disease without spinal canal stenosis. Severe right C4 neural foraminal stenosis   [AE]  2103 DG Elbow Complete Right Negative. [AE]  2104 DG Knee Complete 4 Views Right Complex acute comminuted and depressed tibial plateau fracture extending to the tibial metadiaphysis. Associated lipohemarthrosis.   [AE]  2120 Orthopedist on-call Dr.Carly.  Who recommended to transfer the patient to a higher level of trauma for surgery.  [AE]  2122 Updated patient and family about transferring patient to another trauma center.  They are agreeable with the plan [AE]  2213 Talk with hospitalist doctor for admission in [facility] health.  They admitted patient, patient will require right  basic labs before transfer. [AE]  2214 Updated patient about transfer.  Patient is in intense pain I will order Zofran  and morphine [AE]  2323 Comprehensive metabolic panel(!) Lecture lites within normal limits, total bilirubin 1.4 GFR decreased 30 [AE]    Clinical Course User Index [AE] Janit Kast, PA-C    IMPRESSION / MDM / ASSESSMENT AND PLAN / ED COURSE  I reviewed the triage vital signs and the nursing notes.  Differential diagnosis includes, but is not limited to, fracture, dislocation, intracranial hemorrhage, laceration, avulsion of the skin, abrasion of the skin  Patient's presentation is most consistent with acute complicated illness / injury requiring diagnostic workup.   Darren Gonzales is a 89 y.o., male presents today after falling from a ladder about 6 feet high.  On a physical exam patient is stable, [place] 15/15.  Presence of abrasion of the skin in bilateral forearms.  Right knee: Skin is intact, no ecchymosis no hematomas.  Presence of prepatellar edema.   Tenderness to palpation in the lateral patellar area.  Full ROM limited by pain.  Sensation is intact.  Pulses positive.  Rest of the physical exam is normal Plan Clean and apply dressing in both forearms Tramadol plus acetaminophen  Consult orthopedics Immobilization CT of the head, cervical CT, right elbow x-ray were negative ruling out intracranial hemorrhage, cervical fracture or subluxation, elbow fracture. X-ray of the knee reported comminuted tibial plateau fracture. Consulted orthopedics who recommended transfer the patient for a trauma center due to the complexity of the surgery.  Consulted [place] [facility] who recommended to call hospitalist. Patient is going to be transferred to [facility], admitted by hospitalist.  Patient's diagnosis is consistent with closed fracture of right tibial plateau I independently reviewed and interpreted imaging and agree with radiologists findings.  Due to complexity of the fracture patient is going to be transferred to [facility] trauma center for surgery.  Hospitalist admitted the patient.  Patient and family were updated.  Discussed plan of care with patient, answered all of patient's questions, patient agreeable to plan of care. Patient verbalized understanding. Transfer care to Dr. Willo at 11:20 PM. FINAL CLINICAL IMPRESSION(S) / ED DIAGNOSES   Final diagnoses:  Fall, initial encounter  Closed fracture of right tibial plateau, initial encounter     Rx / DC Orders   ED Discharge Orders     None        Note:  This document was prepared using Dragon voice recognition software and may include unintentional dictation errors.   Janit Kast, PA-C 03/26/24 2329    Clarine Ozell LABOR, MD 03/27/24 [phone]

## 2024-03-26 NOTE — ED Triage Notes (Signed)
 Pt to ED from home c/o fall off a ladder today approx 28ft high onto the ground.  States heard a pop to right knee.  Abrasion to right elbow. States ladder may have hit head coming down but denies LOC.  States daily baby ASA but no other thinners.  States unable to bear weight on right leg since.

## 2024-03-26 NOTE — ED Notes (Signed)
 Son signed consent for transfer

## 2024-03-27 ENCOUNTER — Inpatient Hospital Stay (HOSPITAL_COMMUNITY): Admitting: Anesthesiology

## 2024-03-27 ENCOUNTER — Inpatient Hospital Stay (HOSPITAL_COMMUNITY)

## 2024-03-27 ENCOUNTER — Encounter (HOSPITAL_COMMUNITY)
Admission: AD | Disposition: A | Payer: Self-pay | Source: Other Acute Inpatient Hospital | Attending: Orthopedic Surgery

## 2024-03-27 ENCOUNTER — Encounter (HOSPITAL_COMMUNITY): Payer: Self-pay | Admitting: Orthopedic Surgery

## 2024-03-27 ENCOUNTER — Inpatient Hospital Stay (HOSPITAL_COMMUNITY)
Admission: AD | Admit: 2024-03-27 | Discharge: 2024-03-29 | DRG: 493 | Disposition: A | Discharge status: Home Health | Source: Other Acute Inpatient Hospital | Attending: Internal Medicine | Admitting: Internal Medicine

## 2024-03-27 ENCOUNTER — Other Ambulatory Visit: Payer: Self-pay

## 2024-03-27 DIAGNOSIS — Z950 Presence of cardiac pacemaker: Secondary | ICD-10-CM | POA: Diagnosis present

## 2024-03-27 DIAGNOSIS — E8721 Acute metabolic acidosis: Secondary | ICD-10-CM | POA: Diagnosis present

## 2024-03-27 DIAGNOSIS — D696 Thrombocytopenia, unspecified: Secondary | ICD-10-CM | POA: Diagnosis not present

## 2024-03-27 DIAGNOSIS — Z8614 Personal history of Methicillin resistant Staphylococcus aureus infection: Secondary | ICD-10-CM

## 2024-03-27 DIAGNOSIS — Z87891 Personal history of nicotine dependence: Secondary | ICD-10-CM | POA: Diagnosis not present

## 2024-03-27 DIAGNOSIS — E875 Hyperkalemia: Secondary | ICD-10-CM | POA: Diagnosis present

## 2024-03-27 DIAGNOSIS — Z7901 Long term (current) use of anticoagulants: Secondary | ICD-10-CM

## 2024-03-27 DIAGNOSIS — G250 Essential tremor: Secondary | ICD-10-CM | POA: Diagnosis present

## 2024-03-27 DIAGNOSIS — J449 Chronic obstructive pulmonary disease, unspecified: Secondary | ICD-10-CM | POA: Diagnosis not present

## 2024-03-27 DIAGNOSIS — I5032 Chronic diastolic (congestive) heart failure: Secondary | ICD-10-CM | POA: Diagnosis not present

## 2024-03-27 DIAGNOSIS — Z86718 Personal history of other venous thrombosis and embolism: Secondary | ICD-10-CM

## 2024-03-27 DIAGNOSIS — M5030 Other cervical disc degeneration, unspecified cervical region: Secondary | ICD-10-CM | POA: Diagnosis present

## 2024-03-27 DIAGNOSIS — M4802 Spinal stenosis, cervical region: Secondary | ICD-10-CM | POA: Diagnosis present

## 2024-03-27 DIAGNOSIS — I495 Sick sinus syndrome: Secondary | ICD-10-CM | POA: Diagnosis not present

## 2024-03-27 DIAGNOSIS — N1832 Chronic kidney disease, stage 3b: Secondary | ICD-10-CM | POA: Diagnosis present

## 2024-03-27 DIAGNOSIS — Z7401 Bed confinement status: Secondary | ICD-10-CM | POA: Diagnosis not present

## 2024-03-27 DIAGNOSIS — S82141A Displaced bicondylar fracture of right tibia, initial encounter for closed fracture: Secondary | ICD-10-CM | POA: Diagnosis not present

## 2024-03-27 DIAGNOSIS — W11XXXA Fall on and from ladder, initial encounter: Secondary | ICD-10-CM | POA: Diagnosis present

## 2024-03-27 DIAGNOSIS — Z9889 Other specified postprocedural states: Secondary | ICD-10-CM | POA: Diagnosis not present

## 2024-03-27 DIAGNOSIS — Z862 Personal history of diseases of the blood and blood-forming organs and certain disorders involving the immune mechanism: Secondary | ICD-10-CM

## 2024-03-27 DIAGNOSIS — D62 Acute posthemorrhagic anemia: Secondary | ICD-10-CM | POA: Diagnosis not present

## 2024-03-27 DIAGNOSIS — I130 Hypertensive heart and chronic kidney disease with heart failure and stage 1 through stage 4 chronic kidney disease, or unspecified chronic kidney disease: Secondary | ICD-10-CM | POA: Diagnosis present

## 2024-03-27 DIAGNOSIS — G2581 Restless legs syndrome: Secondary | ICD-10-CM | POA: Diagnosis present

## 2024-03-27 DIAGNOSIS — I10 Essential (primary) hypertension: Secondary | ICD-10-CM | POA: Diagnosis not present

## 2024-03-27 DIAGNOSIS — F32A Depression, unspecified: Secondary | ICD-10-CM | POA: Diagnosis present

## 2024-03-27 DIAGNOSIS — K219 Gastro-esophageal reflux disease without esophagitis: Secondary | ICD-10-CM | POA: Diagnosis present

## 2024-03-27 DIAGNOSIS — Z888 Allergy status to other drugs, medicaments and biological substances status: Secondary | ICD-10-CM

## 2024-03-27 DIAGNOSIS — S82143A Displaced bicondylar fracture of unspecified tibia, initial encounter for closed fracture: Secondary | ICD-10-CM | POA: Diagnosis present

## 2024-03-27 DIAGNOSIS — N183 Chronic kidney disease, stage 3 unspecified: Secondary | ICD-10-CM | POA: Diagnosis present

## 2024-03-27 DIAGNOSIS — N400 Enlarged prostate without lower urinary tract symptoms: Secondary | ICD-10-CM | POA: Diagnosis present

## 2024-03-27 DIAGNOSIS — W19XXXA Unspecified fall, initial encounter: Secondary | ICD-10-CM | POA: Diagnosis not present

## 2024-03-27 DIAGNOSIS — Z7982 Long term (current) use of aspirin: Secondary | ICD-10-CM

## 2024-03-27 DIAGNOSIS — G8929 Other chronic pain: Secondary | ICD-10-CM | POA: Diagnosis present

## 2024-03-27 DIAGNOSIS — I2510 Atherosclerotic heart disease of native coronary artery without angina pectoris: Secondary | ICD-10-CM | POA: Diagnosis present

## 2024-03-27 DIAGNOSIS — S82142A Displaced bicondylar fracture of left tibia, initial encounter for closed fracture: Secondary | ICD-10-CM | POA: Diagnosis not present

## 2024-03-27 DIAGNOSIS — E782 Mixed hyperlipidemia: Secondary | ICD-10-CM | POA: Diagnosis present

## 2024-03-27 DIAGNOSIS — S82201A Unspecified fracture of shaft of right tibia, initial encounter for closed fracture: Secondary | ICD-10-CM

## 2024-03-27 DIAGNOSIS — S82101A Unspecified fracture of upper end of right tibia, initial encounter for closed fracture: Secondary | ICD-10-CM | POA: Diagnosis not present

## 2024-03-27 DIAGNOSIS — I959 Hypotension, unspecified: Secondary | ICD-10-CM | POA: Diagnosis not present

## 2024-03-27 DIAGNOSIS — S50311A Abrasion of right elbow, initial encounter: Secondary | ICD-10-CM | POA: Diagnosis present

## 2024-03-27 DIAGNOSIS — I129 Hypertensive chronic kidney disease with stage 1 through stage 4 chronic kidney disease, or unspecified chronic kidney disease: Secondary | ICD-10-CM | POA: Diagnosis not present

## 2024-03-27 DIAGNOSIS — Z79899 Other long term (current) drug therapy: Secondary | ICD-10-CM

## 2024-03-27 DIAGNOSIS — R609 Edema, unspecified: Secondary | ICD-10-CM | POA: Diagnosis not present

## 2024-03-27 DIAGNOSIS — G4733 Obstructive sleep apnea (adult) (pediatric): Secondary | ICD-10-CM | POA: Diagnosis present

## 2024-03-27 DIAGNOSIS — E8889 Other specified metabolic disorders: Secondary | ICD-10-CM | POA: Diagnosis present

## 2024-03-27 HISTORY — DX: Presence of cardiac pacemaker: Z95.0

## 2024-03-27 HISTORY — PX: ORIF TIBIA PLATEAU: SHX2132

## 2024-03-27 LAB — ABO/RH: ABO/RH(D): O POS

## 2024-03-27 SURGERY — OPEN REDUCTION INTERNAL FIXATION (ORIF) TIBIAL PLATEAU
Anesthesia: General | Laterality: Right

## 2024-03-27 MED ORDER — FENTANYL CITRATE (PF) 100 MCG/2ML IJ SOLN
25.0000 ug | INTRAMUSCULAR | Status: DC | PRN
  Administered 2024-03-27: 50 ug via INTRAVENOUS
  Administered 2024-03-27 (×2): 25 ug via INTRAVENOUS

## 2024-03-27 MED ORDER — ROCURONIUM BROMIDE 10 MG/ML (PF) SYRINGE
PREFILLED_SYRINGE | INTRAVENOUS | Status: DC | PRN
  Administered 2024-03-27: 60 mg via INTRAVENOUS

## 2024-03-27 MED ORDER — LIDOCAINE 2% (20 MG/ML) 5 ML SYRINGE
INTRAMUSCULAR | Status: DC | PRN
  Administered 2024-03-27: 40 mg via INTRAVENOUS

## 2024-03-27 MED ORDER — EPHEDRINE 5 MG/ML INJ
INTRAVENOUS | Status: AC
Start: 2024-03-27 — End: 2024-03-27
  Filled 2024-03-27: qty 5

## 2024-03-27 MED ORDER — OXYCODONE HCL 5 MG PO TABS: 5.0000 mg | ORAL_TABLET | Freq: Once | ORAL | Status: DC | PRN

## 2024-03-27 MED ORDER — CEFAZOLIN SODIUM-DEXTROSE 2-4 GM/100ML-% IV SOLN
2.0000 g | Freq: Three times a day (TID) | INTRAVENOUS | Status: DC
  Filled 2024-03-27: qty 100

## 2024-03-27 MED ORDER — CEFAZOLIN SODIUM-DEXTROSE 2-4 GM/100ML-% IV SOLN
2.0000 g | Freq: Three times a day (TID) | INTRAVENOUS | Status: AC
  Administered 2024-03-27 – 2024-03-28 (×3): 2 g via INTRAVENOUS
  Filled 2024-03-27 (×3): qty 100

## 2024-03-27 MED ORDER — PHENYLEPHRINE 80 MCG/ML (10ML) SYRINGE FOR IV PUSH (FOR BLOOD PRESSURE SUPPORT)
PREFILLED_SYRINGE | INTRAVENOUS | Status: AC
  Filled 2024-03-27: qty 10

## 2024-03-27 MED ORDER — COLCHICINE 0.6 MG PO TABS: 0.6000 mg | ORAL_TABLET | Freq: Every day | ORAL | Status: DC | PRN

## 2024-03-27 MED ORDER — ONDANSETRON HCL 4 MG/2ML IJ SOLN: 4.0000 mg | Freq: Once | INTRAMUSCULAR | Status: DC | PRN

## 2024-03-27 MED ORDER — MORPHINE SULFATE (PF) 2 MG/ML IV SOLN
0.5000 mg | INTRAVENOUS | Status: DC | PRN
  Administered 2024-03-27 – 2024-03-28 (×3): 0.5 mg via INTRAVENOUS
  Filled 2024-03-27 (×3): qty 1

## 2024-03-27 MED ORDER — PHENYLEPHRINE 80 MCG/ML (10ML) SYRINGE FOR IV PUSH (FOR BLOOD PRESSURE SUPPORT)
PREFILLED_SYRINGE | INTRAVENOUS | Status: DC | PRN
  Administered 2024-03-27: 80 ug via INTRAVENOUS
  Administered 2024-03-27: 160 ug via INTRAVENOUS

## 2024-03-27 MED ORDER — TRANEXAMIC ACID-NACL 1000-0.7 MG/100ML-% IV SOLN
INTRAVENOUS | Status: DC | PRN
  Administered 2024-03-27: 1000 mg via INTRAVENOUS

## 2024-03-27 MED ORDER — PROPOFOL 10 MG/ML IV BOLUS
INTRAVENOUS | Status: DC | PRN
  Administered 2024-03-27: 80 mg via INTRAVENOUS

## 2024-03-27 MED ORDER — FENTANYL CITRATE (PF) 100 MCG/2ML IJ SOLN
INTRAMUSCULAR | Status: AC
  Filled 2024-03-27: qty 2

## 2024-03-27 MED ORDER — CEFAZOLIN SODIUM-DEXTROSE 2-3 GM-%(50ML) IV SOLR
INTRAVENOUS | Status: DC | PRN
  Administered 2024-03-27: 2 g via INTRAVENOUS

## 2024-03-27 MED ORDER — DEXMEDETOMIDINE HCL IN NACL 80 MCG/20ML IV SOLN
INTRAVENOUS | Status: DC | PRN
  Administered 2024-03-27: 8 ug via INTRAVENOUS

## 2024-03-27 MED ORDER — ENOXAPARIN SODIUM 40 MG/0.4ML IJ SOSY
40.0000 mg | PREFILLED_SYRINGE | INTRAMUSCULAR | Status: DC
  Administered 2024-03-28: 40 mg via SUBCUTANEOUS
  Filled 2024-03-27 (×2): qty 0.4

## 2024-03-27 MED ORDER — SUGAMMADEX SODIUM 200 MG/2ML IV SOLN
INTRAVENOUS | Status: DC | PRN
Start: 2024-03-27 — End: 2024-03-27
  Administered 2024-03-27: 160 mg via INTRAVENOUS

## 2024-03-27 MED ORDER — LIDOCAINE 2% (20 MG/ML) 5 ML SYRINGE
INTRAMUSCULAR | Status: AC
  Filled 2024-03-27: qty 10

## 2024-03-27 MED ORDER — ROSUVASTATIN CALCIUM 5 MG PO TABS
10.0000 mg | ORAL_TABLET | Freq: Every day | ORAL | Status: DC
  Administered 2024-03-27 – 2024-03-28 (×2): 10 mg via ORAL
  Filled 2024-03-27 (×2): qty 2

## 2024-03-27 MED ORDER — OXYCODONE HCL 5 MG/5ML PO SOLN: 5.0000 mg | Freq: Once | ORAL | Status: DC | PRN

## 2024-03-27 MED ORDER — LACTATED RINGERS IV SOLN
INTRAVENOUS | Status: DC
Start: 2024-03-27 — End: 2024-03-27

## 2024-03-27 MED ORDER — ORAL CARE MOUTH RINSE: 15.0000 mL | Freq: Once | OROMUCOSAL | Status: AC

## 2024-03-27 MED ORDER — MEPERIDINE HCL 25 MG/ML IJ SOLN
6.2500 mg | INTRAMUSCULAR | Status: DC | PRN
  Filled 2024-03-27: qty 1

## 2024-03-27 MED ORDER — CHLORHEXIDINE GLUCONATE 0.12 % MT SOLN
OROMUCOSAL | Status: AC
  Administered 2024-03-27: 15 mL via OROMUCOSAL
  Filled 2024-03-27: qty 15

## 2024-03-27 MED ORDER — MORPHINE SULFATE (PF) 4 MG/ML IV SOLN
4.0000 mg | Freq: Once | INTRAVENOUS | Status: AC
  Administered 2024-03-27: 4 mg via INTRAVENOUS
  Filled 2024-03-27: qty 1

## 2024-03-27 MED ORDER — ONDANSETRON HCL 4 MG/2ML IJ SOLN
INTRAMUSCULAR | Status: AC
Start: 2024-03-27 — End: 2024-03-27
  Filled 2024-03-27: qty 2

## 2024-03-27 MED ORDER — ACETAMINOPHEN 10 MG/ML IV SOLN
INTRAVENOUS | Status: DC | PRN
  Administered 2024-03-27: 1000 mg via INTRAVENOUS

## 2024-03-27 MED ORDER — METOPROLOL TARTRATE 5 MG/5ML IV SOLN
INTRAVENOUS | Status: AC
  Filled 2024-03-27: qty 5

## 2024-03-27 MED ORDER — ACETAMINOPHEN 10 MG/ML IV SOLN
INTRAVENOUS | Status: AC
  Filled 2024-03-27: qty 100

## 2024-03-27 MED ORDER — PRAMIPEXOLE DIHYDROCHLORIDE 0.25 MG PO TABS
0.2500 mg | ORAL_TABLET | Freq: Three times a day (TID) | ORAL | Status: DC
  Administered 2024-03-27 – 2024-03-29 (×5): 0.25 mg via ORAL
  Filled 2024-03-27 (×7): qty 1

## 2024-03-27 MED ORDER — DEXAMETHASONE SODIUM PHOSPHATE 10 MG/ML IJ SOLN
INTRAMUSCULAR | Status: AC
  Filled 2024-03-27: qty 1

## 2024-03-27 MED ORDER — PROPRANOLOL HCL ER 60 MG PO CP24
60.0000 mg | ORAL_CAPSULE | Freq: Every day | ORAL | Status: DC
  Administered 2024-03-28: 60 mg via ORAL
  Filled 2024-03-27 (×2): qty 1

## 2024-03-27 MED ORDER — ONDANSETRON HCL 4 MG/2ML IJ SOLN
INTRAMUSCULAR | Status: DC | PRN
Start: 2024-03-27 — End: 2024-03-27
  Administered 2024-03-27: 4 mg via INTRAVENOUS

## 2024-03-27 MED ORDER — ISOSORBIDE MONONITRATE ER 30 MG PO TB24
30.0000 mg | ORAL_TABLET | Freq: Every day | ORAL | Status: DC
  Administered 2024-03-28 – 2024-03-29 (×2): 30 mg via ORAL
  Filled 2024-03-27 (×2): qty 1

## 2024-03-27 MED ORDER — SENNOSIDES-DOCUSATE SODIUM 8.6-50 MG PO TABS
1.0000 | ORAL_TABLET | Freq: Two times a day (BID) | ORAL | Status: DC
  Administered 2024-03-27 – 2024-03-29 (×4): 1 via ORAL
  Filled 2024-03-27 (×5): qty 1

## 2024-03-27 MED ORDER — TRANEXAMIC ACID-NACL 1000-0.7 MG/100ML-% IV SOLN
INTRAVENOUS | Status: AC
Start: 2024-03-27 — End: 2024-03-27
  Filled 2024-03-27: qty 100

## 2024-03-27 MED ORDER — MONTELUKAST SODIUM 10 MG PO TABS
10.0000 mg | ORAL_TABLET | Freq: Every day | ORAL | Status: DC
  Administered 2024-03-27 – 2024-03-28 (×2): 10 mg via ORAL
  Filled 2024-03-27 (×2): qty 1

## 2024-03-27 MED ORDER — FENTANYL CITRATE (PF) 250 MCG/5ML IJ SOLN
INTRAMUSCULAR | Status: DC | PRN
  Administered 2024-03-27 (×2): 50 ug via INTRAVENOUS
  Administered 2024-03-27: 100 ug via INTRAVENOUS
  Administered 2024-03-27: 50 ug via INTRAVENOUS

## 2024-03-27 MED ORDER — PROPOFOL 10 MG/ML IV BOLUS
INTRAVENOUS | Status: AC
  Filled 2024-03-27: qty 20

## 2024-03-27 MED ORDER — DEXAMETHASONE SODIUM PHOSPHATE 10 MG/ML IJ SOLN
INTRAMUSCULAR | Status: DC | PRN
  Administered 2024-03-27: 10 mg via INTRAVENOUS

## 2024-03-27 MED ORDER — FENTANYL CITRATE (PF) 250 MCG/5ML IJ SOLN
INTRAMUSCULAR | Status: AC
  Filled 2024-03-27: qty 5

## 2024-03-27 MED ORDER — CHLORHEXIDINE GLUCONATE 0.12 % MT SOLN: 15.0000 mL | Freq: Once | OROMUCOSAL | Status: AC

## 2024-03-27 MED ORDER — ACETAMINOPHEN 325 MG PO TABS: 650.0000 mg | ORAL_TABLET | Freq: Four times a day (QID) | ORAL | Status: DC | PRN

## 2024-03-27 MED ORDER — 0.9 % SODIUM CHLORIDE (POUR BTL) OPTIME
TOPICAL | Status: DC | PRN
  Administered 2024-03-27: 1000 mL

## 2024-03-27 MED ORDER — HYDROCODONE-ACETAMINOPHEN 5-325 MG PO TABS
1.0000 | ORAL_TABLET | Freq: Four times a day (QID) | ORAL | Status: DC | PRN
  Administered 2024-03-27 – 2024-03-28 (×3): 1 via ORAL
  Filled 2024-03-27 (×4): qty 1

## 2024-03-27 MED ORDER — EPHEDRINE SULFATE-NACL 50-0.9 MG/10ML-% IV SOSY
PREFILLED_SYRINGE | INTRAVENOUS | Status: DC | PRN
  Administered 2024-03-27: 10 mg via INTRAVENOUS

## 2024-03-27 MED ORDER — PHENYLEPHRINE HCL-NACL 20-0.9 MG/250ML-% IV SOLN
INTRAVENOUS | Status: DC | PRN
  Administered 2024-03-27: 55 ug/min via INTRAVENOUS

## 2024-03-27 MED ORDER — ROCURONIUM BROMIDE 10 MG/ML (PF) SYRINGE
PREFILLED_SYRINGE | INTRAVENOUS | Status: AC
Start: 2024-03-27 — End: 2024-03-27
  Filled 2024-03-27: qty 20

## 2024-03-27 MED ORDER — CEFAZOLIN SODIUM 1 G IJ SOLR
INTRAMUSCULAR | Status: AC
  Filled 2024-03-27: qty 20

## 2024-03-27 SURGICAL SUPPLY — 73 items
BAG COUNTER SPONGE SURGICOUNT (BAG) ×2 IMPLANT
BANDAGE ESMARK 6X9 LF (GAUZE/BANDAGES/DRESSINGS) ×2 IMPLANT
BIT DRILL 2.5X2.75 QC CALB (BIT) IMPLANT
BIT DRILL CAL (BIT) IMPLANT
BLADE CLIPPER SURG (BLADE) IMPLANT
BLADE SURG 10 STRL SS (BLADE) ×2 IMPLANT
BLADE SURG 15 STRL LF DISP TIS (BLADE) ×2 IMPLANT
BNDG COHESIVE 4X5 TAN STRL LF (GAUZE/BANDAGES/DRESSINGS) ×2 IMPLANT
BNDG ELASTIC 4X5.8 VLCR STR LF (GAUZE/BANDAGES/DRESSINGS) ×2 IMPLANT
BNDG ELASTIC 6INX 5YD STR LF (GAUZE/BANDAGES/DRESSINGS) ×2 IMPLANT
BNDG ELASTIC 6X10 VLCR STRL LF (GAUZE/BANDAGES/DRESSINGS) IMPLANT
BNDG GAUZE DERMACEA FLUFF 4 (GAUZE/BANDAGES/DRESSINGS) ×2 IMPLANT
BRUSH SCRUB EZ 4% CHG (MISCELLANEOUS) ×2 IMPLANT
BRUSH SCRUB EZ PLAIN DRY (MISCELLANEOUS) ×4 IMPLANT
CANISTER SUCTION 3000ML PPV (SUCTIONS) ×2 IMPLANT
COVER SURGICAL LIGHT HANDLE (MISCELLANEOUS) ×2 IMPLANT
CUFF TRNQT CYL 34X4.125X (TOURNIQUET CUFF) ×2 IMPLANT
DRAPE C-ARM 42X72 X-RAY (DRAPES) ×2 IMPLANT
DRAPE C-ARMOR (DRAPES) ×2 IMPLANT
DRAPE HALF SHEET 40X57 (DRAPES) IMPLANT
DRAPE INCISE IOBAN 66X45 STRL (DRAPES) ×2 IMPLANT
DRAPE U-SHAPE 47X51 STRL (DRAPES) ×2 IMPLANT
DRSG ADAPTIC 3X8 NADH LF (GAUZE/BANDAGES/DRESSINGS) ×2 IMPLANT
DRSG MEPITEL 4X7.2 (GAUZE/BANDAGES/DRESSINGS) IMPLANT
ELECTRODE REM PT RTRN 9FT ADLT (ELECTROSURGICAL) ×2 IMPLANT
GAUZE PAD ABD 8X10 STRL (GAUZE/BANDAGES/DRESSINGS) ×4 IMPLANT
GAUZE SPONGE 4X4 12PLY STRL (GAUZE/BANDAGES/DRESSINGS) ×2 IMPLANT
GLOVE BIO SURGEON STRL SZ8 (GLOVE) ×4 IMPLANT
GLOVE BIOGEL PI IND STRL 7.5 (GLOVE) ×2 IMPLANT
GLOVE BIOGEL PI IND STRL 8 (GLOVE) ×2 IMPLANT
GLOVE SURG ORTHO LTX SZ7.5 (GLOVE) ×4 IMPLANT
GOWN STRL REUS W/ TWL LRG LVL3 (GOWN DISPOSABLE) ×4 IMPLANT
GOWN STRL REUS W/ TWL XL LVL3 (GOWN DISPOSABLE) ×2 IMPLANT
GRAFT BNE CANC CHIPS 1-8 20CC (Bone Implant) IMPLANT
GRAFT BNE CHIP CANC 1-8 40 (Bone Implant) IMPLANT
IMMOBILIZER KNEE 22 UNIV (SOFTGOODS) ×2 IMPLANT
KIT BASIN OR (CUSTOM PROCEDURE TRAY) ×2 IMPLANT
KIT TURNOVER KIT B (KITS) ×2 IMPLANT
KWIRE ACE 1.6X6 (WIRE) IMPLANT
NDL SUT 6 .5 CRC .975X.05 MAYO (NEEDLE) IMPLANT
PACK ORTHO EXTREMITY (CUSTOM PROCEDURE TRAY) ×2 IMPLANT
PAD ARMBOARD POSITIONER FOAM (MISCELLANEOUS) ×4 IMPLANT
PAD CAST 4YDX4 CTTN HI CHSV (CAST SUPPLIES) ×2 IMPLANT
PADDING CAST COTTON 6X4 STRL (CAST SUPPLIES) ×2 IMPLANT
PLATE LOCK 7H STD RT PROX TIB (Plate) IMPLANT
SCREW CORTICAL 3.5MM 38MM (Screw) IMPLANT
SCREW CORTICAL 3.5MM 40MM (Screw) IMPLANT
SCREW CORTICAL 3.5MM 42MM (Screw) IMPLANT
SCREW LOCK CORT STAR 3.5X85 (Screw) IMPLANT
SCREW LOCK CORT STAR 3.5X90 (Screw) IMPLANT
SCREW LOW PROF CORTICAL 3.5X80 (Screw) IMPLANT
SCREW LP 3.5X85MM (Screw) IMPLANT
SCREW LP 3.5X95MM (Screw) IMPLANT
SOLN 0.9% NACL 1000 ML (IV SOLUTION) ×1 IMPLANT
SOLN 0.9% NACL POUR BTL 1000ML (IV SOLUTION) ×2 IMPLANT
SOLN STERILE WATER 1000 ML (IV SOLUTION) ×2 IMPLANT
SOLN STERILE WATER BTL 1000 ML (IV SOLUTION) ×4 IMPLANT
SPONGE T-LAP 18X18 ~~LOC~~+RFID (SPONGE) ×2 IMPLANT
STAPLER SKIN PROX 35W (STAPLE) ×2 IMPLANT
STOCKINETTE IMPERVIOUS LG (DRAPES) ×2 IMPLANT
SUCTION TUBE FRAZIER 10FR DISP (SUCTIONS) ×2 IMPLANT
SUT ETHILON 2 0 FS 18 (SUTURE) IMPLANT
SUT ETHILON 2 0 PSLX (SUTURE) IMPLANT
SUT PROLENE 0 CT 2 (SUTURE) ×4 IMPLANT
SUT VIC AB 0 CT1 27XBRD ANBCTR (SUTURE) ×2 IMPLANT
SUT VIC AB 1 CT1 27XBRD ANBCTR (SUTURE) ×2 IMPLANT
SUT VIC AB 2-0 CT1 TAPERPNT 27 (SUTURE) ×4 IMPLANT
SYR 5ML LUER SLIP (SYRINGE) IMPLANT
TOWEL GREEN STERILE (TOWEL DISPOSABLE) ×4 IMPLANT
TOWEL GREEN STERILE FF (TOWEL DISPOSABLE) ×2 IMPLANT
TRAY FOLEY MTR SLVR 16FR STAT (SET/KITS/TRAYS/PACK) IMPLANT
TUBE CONNECTING 12X1/4 (SUCTIONS) ×2 IMPLANT
YANKAUER SUCT BULB TIP NO VENT (SUCTIONS) ×2 IMPLANT

## 2024-03-27 NOTE — Plan of Care (Signed)

## 2024-03-27 NOTE — Progress Notes (Signed)
 Orthopedic Tech Progress Note Patient Details:  Darren Gonzales August 19, 1935 [id number]  Ortho Devices Type of Ortho Device: Bone foam zero knee Ortho Device/Splint Location: RLE Ortho Device/Splint Interventions: Ordered, Other (comment)PACU RN called requesting a ROM BRACE UNLOCKED for patient, delivered HANGER brace to bedside   Post Interventions Patient Tolerated: Well Instructions Provided: Care of device  Delanna LITTIE Pac 03/27/2024, 3:09 PM

## 2024-03-27 NOTE — Anesthesia Preprocedure Evaluation (Addendum)
 Anesthesia Evaluation  Patient identified by MRN, date of birth, ID band Patient awake    Reviewed: Allergy & Precautions, H&P , NPO status , Patient's Chart, lab work & pertinent test results  Airway Mallampati: I  TM Distance: >3 FB Neck ROM: Full    Dental no notable dental hx. (+) Edentulous Upper, Edentulous Lower   Pulmonary neg pulmonary ROS, shortness of breath, sleep apnea , COPD, former smoker   Pulmonary exam normal breath sounds clear to auscultation       Cardiovascular Exercise Tolerance: Good hypertension, Pt. on medications and Pt. on home beta blockers + CAD and +CHF  negative cardio ROS Normal cardiovascular exam Rhythm:Regular Rate:Normal     Neuro/Psych negative neurological ROS  negative psych ROS   GI/Hepatic negative GI ROS, Neg liver ROS,,,  Endo/Other  negative endocrine ROS    Renal/GU CRFRenal diseasenegative Renal ROS  negative genitourinary   Musculoskeletal negative musculoskeletal ROS (+)    Abdominal   Peds negative pediatric ROS (+)  Hematology negative hematology ROS (+) Blood dyscrasia, anemia   Anesthesia Other Findings   Reproductive/Obstetrics negative OB ROS                              Anesthesia Physical Anesthesia Plan  ASA: 3 and emergent  Anesthesia Plan: General   Post-op Pain Management: Minimal or no pain anticipated and Ofirmev  IV (intra-op)*   Induction: Intravenous  PONV Risk Score and Plan: 2 and Ondansetron , Dexamethasone  and Treatment may vary due to age or medical condition  Airway Management Planned: Oral ETT  Additional Equipment: None  Intra-op Plan:   Post-operative Plan: Extubation in OR  Informed Consent: I have reviewed the patients History and Physical, chart, labs and discussed the procedure including the risks, benefits and alternatives for the proposed anesthesia with the patient or authorized representative  who has indicated his/her understanding and acceptance.       Plan Discussed with: Anesthesiologist and CRNA  Anesthesia Plan Comments: KENECHUKWU ECKSTEIN is a 88 y.o. male    with a past medical history of lumbar disc disease CKD, hyperlipidemia, bronchitis, bladder obstruction, radiculopathy, pacemaker, COPD with asthma, who presents to the ED complaining of a fall.  )         Anesthesia Quick Evaluation

## 2024-03-27 NOTE — H&P (Addendum)
 History and Physical    Patient: Darren Gonzales FMW:[id number] DOB: 1936-01-19 DOA: 03/27/2024 DOS: the patient was seen and examined on 03/27/2024 PCP: Cleotilde Oneil FALCON, MD  Patient coming from: Transfer from [facility]  Chief Complaint: Fall  HPI: Darren Gonzales is a 88 y.o. male with medical history significant of hypertension, sick sinus syndrome s/p PPM, COPD, chronic kidney disease stage IIIb, and arthritis who presents with right leg pain after a fall from a ladder. He is accompanied by his son, Cheryl.  He fell from a ladder while attempting to adjust it, resulting in significant pain in his right leg. No head injury or loss of consciousness occurred during the fall.  He has a history of a pinched nerve in his left hip, for which he takes gabapentin, tramadol, and extra strength Tylenol . He also uses Celebrex for this condition. He was initially on hydrocodone for pain management but was switched to gabapentin and tramadol by his primary. He has not experienced issues with higher dose pain medications in the past.  He was preparing for a barbecue event this coming weekend when the incident occurred.  In the ED patient was noted to be afebrile with pulse elevated up to 112, and all other vital signs relatively maintained.  Labs from 10/5 significant for WBC 20.5, platelets 126, BUN 60, creatinine 2.12, calcium  8.3, albumin  3.3, ALT 47, and total bilirubin 1.4.  X-rays of the right knee revealed a complex acute communicated and depressed tibial plateau fracture extending to the tibial metadiaphysis with associated lipohemarthrosis.  CT imaging of the head and cervical spine did not reveal any acute abnormality.  X-rays of the right elbow did not reveal any acute fracture.  Patient was transferred to [facility] for need of surgical intervention  Review of Systems: As mentioned in the history of present illness. All other systems reviewed and are negative. Past Medical History:  Diagnosis Date    Arthritis    Cancer (HCC)    COPD (chronic obstructive pulmonary disease) (HCC)    Hoarseness of voice    Hypertension    MRSA (methicillin resistant staph aureus) culture positive    Sick sinus syndrome (HCC)    Sleep apnea    Tremor    Past Surgical History:  Procedure Laterality Date   CATARACT EXTRACTION     COLONOSCOPY WITH PROPOFOL  N/A 01/15/2016   Procedure: COLONOSCOPY WITH PROPOFOL ;  Surgeon: Lamar ONEIDA Holmes, MD;  Location[facility] ENDOSCOPY;  Service: Endoscopy;  Laterality: N/A;   ESOPHAGOGASTRODUODENOSCOPY (EGD) WITH PROPOFOL  N/A 01/15/2016   Procedure: ESOPHAGOGASTRODUODENOSCOPY (EGD) WITH PROPOFOL ;  Surgeon: Lamar ONEIDA Holmes, MD;  Location: [facility] ENDOSCOPY;  Service: Endoscopy;  Laterality: N/A;   INSERT / REPLACE / REMOVE PACEMAKER     POSTERIOR LAMINECTOMY / DECOMPRESSION LUMBAR SPINE     PPM GENERATOR CHANGEOUT N/A 08/07/2021   Procedure: PPM GENERATOR CHANGEOUT;  Surgeon: Ammon Blunt, MD;  Location: [facility] INVASIVE CV LAB;  Service: Cardiovascular;  Laterality: N/A;   Social History:  reports that he has quit smoking. He has never used smokeless tobacco. He reports that he does not drink alcohol and does not use drugs.  Allergies  Allergen Reactions   Gabapentin Other (See Comments)    Messed him up   Lipitor [Atorvastatin] Other (See Comments)    Muscle pain    History reviewed. No pertinent family history.  Prior to Admission medications   Medication Sig Start Date End Date Taking? Authorizing Provider  acetaminophen  (TYLENOL ) 325 MG tablet Take 650  mg by mouth every 6 (six) hours as needed for mild pain, moderate pain, fever or headache.    [provider]  amLODipine  (NORVASC ) 5 MG tablet Take 5 mg by mouth daily. Patient not taking: Reported on 03/30/2022    [provider]  aspirin  81 MG tablet Take 81 mg by mouth every other day.    [provider]  Biotin 1 MG CAPS Take 1 mg by mouth daily.    [provider]   Cholecalciferol 1000 units tablet Take 1,000 Units by mouth daily.    [provider]  colchicine 0.6 MG tablet Take 0.6 mg by mouth daily as needed (Gout). 08/09/19   [provider]  furosemide (LASIX) 20 MG tablet Take 20 mg by mouth daily. 1/2 tab QD prn 02/09/22   [provider]  isosorbide  mononitrate (IMDUR ) 30 MG 24 hr tablet Take 30 mg by mouth daily. 10/11/19   [provider]  montelukast (SINGULAIR) 10 MG tablet Take 10 mg by mouth at bedtime.    [provider]  omeprazole (PRILOSEC) 40 MG capsule Take 40 mg by mouth every other day. 05/02/21   [provider]  pramipexole (MIRAPEX) 0.125 MG tablet Take 0.125 mg by mouth in the morning and at bedtime. 06/30/22 06/30/23  [provider]  propranolol  ER (INDERAL  LA) 60 MG 24 hr capsule Take 60 mg by mouth daily. 09/12/19   [provider]  rosuvastatin  (CRESTOR ) 10 MG tablet Take 10 mg by mouth at bedtime. 01/27/22   [provider]  tamsulosin  (FLOMAX ) 0.4 MG CAPS capsule Take 1 capsule (0.4 mg total) by mouth daily after supper. 05/07/23   Rennie Cindy SAUNDERS, MD    Physical Exam: Vitals:   03/27/24 0907  BP: 135/71  Pulse: 73  Temp: 99 F (37.2 C)  TempSrc: Oral  SpO2: 96%  Weight: 80.3 kg  Height: 5' 9 (1.753 m)    Constitutional: Elderly male currently in no acute distress Eyes: PERRL, lids and conjunctivae normal ENMT: Mucous membranes are moist. Posterior pharynx clear of any exudate or lesions.Normal dentition.  Neck: normal, supple, no masses  Respiratory: clear to auscultation bilaterally, no wheezing, no crackles. Normal respiratory effort. No accessory muscle use.  Cardiovascular: Regular rate and rhythm, no murmurs / rubs / gallops. No extremity edema. 2+ pedal pulses.  Abdomen: no tenderness, no masses palpated. No hepatosplenomegaly. Bowel sounds positive.  Musculoskeletal: no clubbing / cyanosis.  Swelling noted of the right  knee. Skin: no rashes, lesions, ulcers. No induration Neurologic: CN 2-12 grossly intact.   Strength 5/5 in all 4.  Psychiatric: Normal judgment and insight. Alert and oriented x 3. Normal mood.   Data Reviewed:  Reviewed labs, imaging, and pertinent records as documented.  Assessment and Plan:  Right tibial plateau fracture secondary to fall from ladder Patient presents after having a mechanical fall from a 8 foot ladder reportedly approximately 6 feet up.  Denies any loss of consciousness or trauma to his head.  X-ray imaging revealed an acute communicated and depressed lateral tibial plateau fracture. - Admit to a surgical telemetry bed - N.p.o. for need of surgical procedure - Hydrocodone/morphine IV as needed for moderate to severe pain respectively - PT to eval and treat - TOC consulted.  Did not stay on acid - Orthopedics consulted, we will follow-up for any further recommendations  Essential hypertension Blood pressures are currently maintained - Continue isosorbide  mononitrate  COPD Patient without wheezing appreciated on physical exam. -  Continue Singulair - Albuterol  nebs as needed for shortness of breath/wheezing  Sick sinus syndrome s/p PPM  Diastolic congestive heart failure Patient appears euvolemic on physical exam at this time.  Last echocardiogram noted EF to be around 50% with moderate TR  and  mild AR, MR, and PR. - Continue to monitor  Thrombocytopenia Chronic.  Platelet count noted to be 126 which appears similar to priors looking back through patient's history.  Patient with prior history of ITP - Continue to monitor  Chronic kidney disease stage IIIb Creatinine noted to be 2.12 with BUN 60 which appears similar to prior. - Continue to monitor kidney function  History of DVT Patient with a prior history of DVT back in 01/2022 for which patient was placed on anticoagulation at that time possibly related to ITP.  Anticoagulation stopped as of 05/2023.   Followed by oncology in the outpatient setting.  RLS - Continue pramipexole  Essential tremor - Continue propranolol   Hyperlipidemia - Continue Crestor   DVT prophylaxis: Lovenox Advance Care Planning:   Code Status: Prior.  Orthopedics was consulted  Consults: Orthopedics  Family Communication: Son updated at bedside  Severity of Illness: The appropriate patient status for this patient is INPATIENT. Inpatient status is judged to be reasonable and necessary in order to provide the required intensity of service to ensure the patient's safety. The patient's presenting symptoms, physical exam findings, and initial radiographic and laboratory data in the context of their chronic comorbidities is felt to place them at high risk for further clinical deterioration. Furthermore, it is not anticipated that the patient will be medically stable for discharge from the hospital within 2 midnights of admission.   * I certify that at the point of admission it is my clinical judgment that the patient will require inpatient hospital care spanning beyond 2 midnights from the point of admission due to high intensity of service, high risk for further deterioration and high frequency of surveillance required.*  Author: Maximino DELENA Sharps, MD 03/27/2024 10:17 AM  For on call review [email or web address].

## 2024-03-27 NOTE — Op Note (Signed)
 03/27/2024  3:17 PM  PATIENT:  Darren Gonzales  1936-02-28 male   MEDICAL RECORD NUMBER: [id number]  PRE-OPERATIVE DIAGNOSIS:  RIGHT TIBIA FRACTURE  POSTOPERATIVE DIAGNOSES:   1.  RIGHT DEPRESSED LATERAL TIBIAL PLATEAU FRACTURE WITH BICONDYLAR EXTENSION.   2.  INTACT LATERAL MENISCUS. 3.  INTACT COLLATERAL LIGAMENTS.  PROCEDURES: 1.  Open reduction internal fixation of right tibial plateau. 2.  Arthrotomy right knee. 3.  Anterior compartment fasciotomy. 4.  MANUAL APPLICATION OF STRESS UNDER FLUOROSCOPY.  SURGEON:  Ozell Bruch, MD  ASSISTANT:  Francis Mt, PA-C  ANESTHESIA:  General.  COMPLICATIONS:  None.  TOURNIQUET:  None.  ESTIMATED BLOOD LOSS:  100 mL.  SPECIMENS:  None.  DRAINS:  None.  DISPOSITION:  To PACU.  CONDITION:  Stable.  BRIEF SUMMARY AND INDICATIONS FOR PROCEDURE:  The patient is a very pleasant 88 y.o. who sustained tibial plateau fracture in fall from a ladder resulting in swelling, pain, inability to bear weight.  Subsequent x-rays and CT scan demonstrated a lateral tibial plateau depression and was also associated with clinical instability in full extension on physical examination.  He was sent urgently from outside facility, [facility].  I discussed with the  patient and his son risks and benefits of surgical repair, including the possibility of infection, nerve injury, vessel injury, DVT, PE, particularly given his medical history, as well as malunion, nonunion, symptomatic hardware, heart attack, stroke and other  complications.  After acknowledging these risks, the patient provided consent to proceed.  BRIEF SUMMARY OF PROCEDURE:  The patient was taken to the operating room where general anesthesia was induced.  The operative lower extremity was prepped and draped in the usual sterile fashion with chlorhexidine  wash, then Betadine scrub and paint.  I made a curvilinear incision over Gerdy's tubercle.  Because of the high risk of associated meniscal tear  in this fracture pattern I chose to make a formal knee arthrotomy.  The retinaculum was incised proximal to the joint and then the coronary ligament incised along its base and the knee swung into varus to open up the lateral compartment for visibility. The lateral meniscus was found to be intact.   Prolene sutures were passed in vertical mattress technique through the retinaculum and the edge of the lateral meniscus.  This revealed significant depression.    I then went to the metaphysis making a trap door using the curved 1/2-inch osteotome with a posteriorly based hinge.  This was opened up and the bone tamp used to elevate the joint surface in sequential fashion supplemented with both fluoroscopy and direct visualization to restore the entirety of the joint to the appropriate height.  We then packed into the bone void 50 cc of cancellous bone chips.  This was secured initially with multiple K wires and then the Biomet ALPS plate applied laterally.  Standard screws were used initially in the top level in the most anterior and posterior holes and then lock fixation was used for the remainder.  Standard screws were placed distally in the plate prior to beginning placement of any of the locked fixation.  AP and lateral views showed outstanding reduction and overall knee alignment. Lastly, the long Metzenbaum scissors were used to spread superficial and deep to the anterior compartment fascia and then the scissors were passed 10 cm along the fascia to perform an anterior compartment release to reduce the risk of postoperative compartment syndrome or other complications.   In extension, manual application of stress to the joint was performed  under flouroscopy to evaluate for collateral ligament injury. It appeared to be stable. All wounds were irrigated thoroughly and then closed in standard layered fashion using 0 Prolene vertical mattress for coronary multi-ligament repair, #1 Vicryl for the retinaculum, 2-0  Vicryl and 2-0 nylon for the subcutaneous and skin.  Sterile gently compressive dressing was applied and a knee immobilizer.  The patient was awakened from anesthesia and transported to the PACU in stable condition.  Francis Mt, PA-C, was present and assisting throughout.  Assistant was absolutely necessary to control the leg for inspection, treatment of a meniscal tear, as well as reduction and provisional definitive fixation of the plateau.  He also assisted with the compartment fasciotomy and closure.  PROGNOSIS:  The patient will have unrestricted range of motion, but will be strictly nonweightbearing for the next 6 weeks with graduated weightbearing thereafter.  Brace when up and mobilizing for extra support. Pharmacologic DVT prophylaxis will be with Lovenox. Patient is to ice, elevate, and mobilize frequently, and contact us  immediately with any concerns. Return to the office in 2 weeks for removal of sutures.

## 2024-03-27 NOTE — Consult Note (Signed)
 Orthopaedic Trauma Service (OTS) Consultation   Patient ID: Darren Gonzales MRN: [id number] DOB/AGE: December 06, 1935 88 y.o.   Reason for Consult: right tibial plateau plateau fracture Referring Physician: [facility] EDP  HPI: Darren Gonzales is an 88 y.o. male who fell off a ladder hanging a bug zapper. Baseline radiculopathy in the RLE but denies motor loss. Pain is  moderately well controlled now, aching and dull, sharp and severe with motion, without associated distal tingling or numbness, and improved with narcotics. No splint, no ice, no CT scan before transfer. However, head and spine CT's were done and both were negative.   Past Medical History:  Diagnosis Date   Arthritis    Cancer (HCC)    COPD (chronic obstructive pulmonary disease) (HCC)    Hoarseness of voice    Hypertension    MRSA (methicillin resistant staph aureus) culture positive    Sick sinus syndrome (HCC)    Sleep apnea    Tremor     Past Surgical History:  Procedure Laterality Date   CATARACT EXTRACTION     COLONOSCOPY WITH PROPOFOL  N/A 01/15/2016   Procedure: COLONOSCOPY WITH PROPOFOL ;  Surgeon: Lamar ONEIDA Holmes, MD;  Location: [facility] ENDOSCOPY;  Service: Endoscopy;  Laterality: N/A;   ESOPHAGOGASTRODUODENOSCOPY (EGD) WITH PROPOFOL  N/A 01/15/2016   Procedure: ESOPHAGOGASTRODUODENOSCOPY (EGD) WITH PROPOFOL ;  Surgeon: Lamar ONEIDA Holmes, MD;  Location: [facility] ENDOSCOPY;  Service: Endoscopy;  Laterality: N/A;   INSERT / REPLACE / REMOVE PACEMAKER     POSTERIOR LAMINECTOMY / DECOMPRESSION LUMBAR SPINE     PPM GENERATOR CHANGEOUT N/A 08/07/2021   Procedure: PPM GENERATOR CHANGEOUT;  Surgeon: Ammon Blunt, MD;  Location: [facility] INVASIVE CV LAB;  Service: Cardiovascular;  Laterality: N/A;    No family history on file.  Social History:  reports that he has quit smoking. He has never used smokeless tobacco. He reports that he does not drink alcohol and does not use drugs.  Allergies:  Allergies   Allergen Reactions   Gabapentin Other (See Comments)    Messed him up   Lipitor [Atorvastatin] Other (See Comments)    Muscle pain    Medications: I have reviewed the patient's current medications.  Results for orders placed or performed during the hospital encounter of 03/26/24 (from the past 48 hours)  Comprehensive metabolic panel     Status: Abnormal   Collection Time: 03/26/24 10:31 PM  Result Value Ref Range   Sodium 141 135 - 145 mmol/L   Potassium 4.7 3.5 - 5.1 mmol/L   Chloride 109 98 - 111 mmol/L   CO2 21 (L) 22 - 32 mmol/L   Glucose, Bld 95 70 - 99 mg/dL    Comment: Glucose reference range applies only to samples taken after fasting for at least 8 hours.   BUN 60 (H) 8 - 23 mg/dL   Creatinine, Ser 7.87 (H) 0.61 - 1.24 mg/dL   Calcium  8.3 (L) 8.9 - 10.3 mg/dL   Total Protein 5.8 (L) 6.5 - 8.1 g/dL   Albumin  3.3 (L) 3.5 - 5.0 g/dL   AST 35 15 - 41 U/L   ALT 47 (H) 0 - 44 U/L   Alkaline Phosphatase 51 38 - 126 U/L   Total Bilirubin 1.4 (H) 0.0 - 1.2 mg/dL   GFR, Estimated 30 (L) >60 mL/min    Comment: (NOTE) Calculated using the CKD-EPI Creatinine Equation (2021)    Anion gap 11 5 - 15    Comment: Performed at [facility]  [facility], [address] [place], [place] [address]  CBC with Differential     Status: Abnormal   Collection Time: 03/26/24 10:31 PM  Result Value Ref Range   WBC 20.5 (H) 4.0 - 10.5 K/uL   RBC 4.73 4.22 - 5.81 MIL/uL   Hemoglobin 15.0 13.0 - 17.0 g/dL   HCT 55.0 60.9 - 47.9 %   MCV 94.9 80.0 - 100.0 fL   MCH 31.7 26.0 - 34.0 pg   MCHC 33.4 30.0 - 36.0 g/dL   RDW 86.4 88.4 - 84.4 %   Platelets 126 (L) 150 - 400 K/uL   nRBC 0.0 0.0 - 0.2 %   Neutrophils Relative % 85 %   Neutro Abs 17.2 (H) 1.7 - 7.7 K/uL   Lymphocytes Relative 6 %   Lymphs Abs 1.3 0.7 - 4.0 K/uL   Monocytes Relative 6 %   Monocytes Absolute 1.3 (H) 0.1 - 1.0 K/uL   Eosinophils Relative 2 %   Eosinophils Absolute 0.3 0.0 - 0.5 K/uL   Basophils Relative 0 %    Basophils Absolute 0.1 0.0 - 0.1 K/uL   Immature Granulocytes 1 %   Abs Immature Granulocytes 0.27 (H) 0.00 - 0.07 K/uL    Comment: Performed at [facility], [address] [place], [place] [address]    DG Knee Complete 4 Views Right Result Date: 03/26/2024 CLINICAL DATA:  fall, acute pain EXAM: RIGHT KNEE - COMPLETE 4+ VIEW COMPARISON:  None Available. FINDINGS: Acute comminuted and depressed lateral tibial plateau fracture. Fracture extends posteriorly along the tibial shaft. Fracture likely extends to the intercondylar eminence as well as to the medial tibial plateau. Associated lipohemarthrosis. No other acute fracture of the bones of the knee. No dislocation. Soft tissue edema. Vascular calcification. IMPRESSION: Complex acute comminuted and depressed tibial plateau fracture extending to the tibial metadiaphysis. Associated lipohemarthrosis. Electronically Signed   By: Darren  Gonzales M.D.   On: 03/26/2024 20:49   DG Elbow Complete Right Result Date: 03/26/2024 CLINICAL DATA:  fall EXAM: RIGHT ELBOW - COMPLETE 3+ VIEW COMPARISON:  None Available. FINDINGS: There is no evidence of fracture, dislocation, or joint effusion. There is no evidence of arthropathy or other focal bone abnormality. Soft tissues are unremarkable. IMPRESSION: Negative. Electronically Signed   By: Darren  Gonzales M.D.   On: 03/26/2024 20:47   CT Head Wo Contrast Result Date: 03/26/2024 EXAM: CT HEAD AND CERVICAL SPINE 03/26/2024 07:59:22 PM TECHNIQUE: CT of the head and cervical spine was performed without the administration of intravenous contrast. Multiplanar reformatted images are provided for review. Automated exposure control, iterative reconstruction, and/or weight based adjustment of the mA/kV was utilized to reduce the radiation dose to as low as reasonably achievable. COMPARISON: 02/13/2022 CLINICAL HISTORY: Head trauma, minor (Age >= 65y). Pt to ED from home c/o fall off a ladder today approx 38ft high onto  the ground. States heard a pop to right knee. Abrasion to right elbow. States ladder may have hit head coming down but denies LOC. States daily baby ASA but no other thinners. States unable to bear weight on right leg since. FINDINGS: CT HEAD BRAIN AND VENTRICLES: No acute intracranial hemorrhage. No mass effect or midline shift. No abnormal extra-axial fluid collection. No evidence of acute infarct. No hydrocephalus. ORBITS: No acute abnormality. SINUSES AND MASTOIDS: No acute abnormality. SOFT TISSUES AND SKULL: No acute skull fracture. No acute soft tissue abnormality. CT CERVICAL SPINE BONES AND ALIGNMENT: No acute fracture or traumatic malalignment. DEGENERATIVE CHANGES: Severe right C4  neural foraminal stenosis. Multilevel cervical degenerative disc disease without spinal canal stenosis. SOFT TISSUES: No prevertebral soft tissue swelling. IMPRESSION: 1. No acute fracture or traumatic malalignment of the cervical spine. 2. No acute intracranial abnormality. 3. Multilevel cervical degenerative disc disease without spinal canal stenosis. Severe right C4 neural foraminal stenosis Electronically signed by: Darren Stanford MD 03/26/2024 08:20 PM EDT RP Workstation: [id number]   CT Cervical Spine Wo Contrast Result Date: 03/26/2024 EXAM: CT HEAD AND CERVICAL SPINE 03/26/2024 07:59:22 PM TECHNIQUE: CT of the head and cervical spine was performed without the administration of intravenous contrast. Multiplanar reformatted images are provided for review. Automated exposure control, iterative reconstruction, and/or weight based adjustment of the mA/kV was utilized to reduce the radiation dose to as low as reasonably achievable. COMPARISON: 02/13/2022 CLINICAL HISTORY: Head trauma, minor (Age >= 65y). Pt to ED from home c/o fall off a ladder today approx 84ft high onto the ground. States heard a pop to right knee. Abrasion to right elbow. States ladder may have hit head coming down but denies LOC. States daily baby ASA but  no other thinners. States unable to bear weight on right leg since. FINDINGS: CT HEAD BRAIN AND VENTRICLES: No acute intracranial hemorrhage. No mass effect or midline shift. No abnormal extra-axial fluid collection. No evidence of acute infarct. No hydrocephalus. ORBITS: No acute abnormality. SINUSES AND MASTOIDS: No acute abnormality. SOFT TISSUES AND SKULL: No acute skull fracture. No acute soft tissue abnormality. CT CERVICAL SPINE BONES AND ALIGNMENT: No acute fracture or traumatic malalignment. DEGENERATIVE CHANGES: Severe right C4 neural foraminal stenosis. Multilevel cervical degenerative disc disease without spinal canal stenosis. SOFT TISSUES: No prevertebral soft tissue swelling. IMPRESSION: 1. No acute fracture or traumatic malalignment of the cervical spine. 2. No acute intracranial abnormality. 3. Multilevel cervical degenerative disc disease without spinal canal stenosis. Severe right C4 neural foraminal stenosis Electronically signed by: Darren Stanford MD 03/26/2024 08:20 PM EDT RP Workstation: [id number]    Intake/Output    None      ROS No recent fever, bleeding abnormalities, urologic dysfunction, GI problems, or weight gain.  Blood pressure 135/71, pulse 73, temperature 99 F (37.2 C), temperature source Oral, height 5' 9 (1.753 m), weight 80.3 kg, SpO2 96%. Physical Exam Super pleasant, son at bedside RLE Skin easily wrinkles in area of incision No open wounds Moderate swelling  Edema/ swelling controlled distally  Sens: DPN, SPN, TN intact  Motor: EHL, FHL, and lessor toe ext and flex all intact grossly  DP 2 palp, Brisk cap refill, slightly cool to touch Gait: could not assess Coordination and balance: could not assess   Assessment/Plan:  Right tibial plateau fracture CHF CKD Right LE radiculopathy  The risks and benefits of right tibial plateau fracture were discussed with the patient, including the possibility of infection, nerve injury, vessel injury, wound  breakdown, arthritis, symptomatic hardware, DVT/ PE, loss of motion, malunion, nonunion, and need for further surgery among others.  We also specifically discussed the possible need to stage surgery because of the elevated risk of soft tissue breakdown that could lead to amputation.  These risks were acknowledged and consent was provided to proceed.   Darren Bruch, MD [facility], [facility] [phone]  03/27/2024, 9:35 AM  [facility] [address] [place] [address] [phone] (F)    After 5pm and on the weekends please log on to Amion, go to orthopaedics and the look under the [facility] Call for the provider(s) on call.  You can also call our office at [phone] and then follow the prompts to be connected to the call team.

## 2024-03-27 NOTE — ED Notes (Signed)
 EMTALA reviewed by this RN.

## 2024-03-27 NOTE — Anesthesia Procedure Notes (Signed)
 Procedure Name: Intubation Date/Time: 03/27/2024 12:25 PM  Performed by: Myrna Homer, CRNAPre-anesthesia Checklist: Patient identified, Emergency Drugs available, Suction available and Patient being monitored Patient Re-evaluated:Patient Re-evaluated prior to induction Oxygen Delivery Method: Circle System Utilized Preoxygenation: Pre-oxygenation with 100% oxygen Induction Type: IV induction Ventilation: Mask ventilation without difficulty Laryngoscope Size: Mac and 4 Grade View: Grade I Tube type: Oral Tube size: 7.5 mm Number of attempts: 1 Airway Equipment and Method: Stylet and Oral airway Placement Confirmation: ETT inserted through vocal cords under direct vision, positive ETCO2 and breath sounds checked- equal and bilateral Secured at: 23 cm Tube secured with: Tape Dental Injury: Teeth and Oropharynx as per pre-operative assessment

## 2024-03-27 NOTE — Transfer of Care (Signed)
 Immediate Anesthesia Transfer of Care Note  Patient: Darren Gonzales  Procedure(s) Performed: OPEN REDUCTION INTERNAL FIXATION (ORIF) TIBIAL PLATEAU (Right)  Patient Location: PACU  Anesthesia Type:General  Level of Consciousness: awake, patient cooperative, and responds to stimulation  Airway & Oxygen Therapy: Patient Spontanous Breathing and Patient connected to face mask oxygen  Post-op Assessment: Report given to RN and Post -op Vital signs reviewed and stable  Post vital signs: Reviewed and stable  Last Vitals:  Vitals Value Taken Time  BP 127/54 03/27/24 14:32  Temp    Pulse    Resp 19 03/27/24 14:34  SpO2    Vitals shown include unfiled device data.  Last Pain:  Vitals:   03/27/24 0939  TempSrc:   PainSc: 4          Complications: No notable events documented.

## 2024-03-28 ENCOUNTER — Encounter: Payer: Self-pay | Admitting: Internal Medicine

## 2024-03-28 ENCOUNTER — Telehealth (HOSPITAL_COMMUNITY): Payer: Self-pay | Admitting: Pharmacy Technician

## 2024-03-28 ENCOUNTER — Other Ambulatory Visit (HOSPITAL_COMMUNITY): Payer: Self-pay

## 2024-03-28 ENCOUNTER — Encounter (HOSPITAL_COMMUNITY): Payer: Self-pay | Admitting: Orthopedic Surgery

## 2024-03-28 LAB — BASIC METABOLIC PANEL WITH GFR
Anion gap: 8 (ref 5–15)
BUN: 56 mg/dL — ABNORMAL HIGH (ref 8–23)
CO2: 18 mmol/L — ABNORMAL LOW (ref 22–32)
Calcium: 7.8 mg/dL — ABNORMAL LOW (ref 8.9–10.3)
Chloride: 111 mmol/L (ref 98–111)
Creatinine, Ser: 2.13 mg/dL — ABNORMAL HIGH (ref 0.61–1.24)
GFR, Estimated: 30 mL/min — ABNORMAL LOW (ref >60)
Glucose, Bld: 148 mg/dL — ABNORMAL HIGH (ref 70–99)
Potassium: 5.2 mmol/L — ABNORMAL HIGH (ref 3.5–5.1)
Sodium: 137 mmol/L (ref 135–145)

## 2024-03-28 LAB — CBC
HCT: 33.9 % — ABNORMAL LOW (ref 39.0–52.0)
Hemoglobin: 11.2 g/dL — ABNORMAL LOW (ref 13.0–17.0)
MCH: 32 pg (ref 26.0–34.0)
MCHC: 33 g/dL (ref 30.0–36.0)
MCV: 96.9 fL (ref 80.0–100.0)
Platelets: 90 K/uL — ABNORMAL LOW (ref 150–400)
RBC: 3.5 MIL/uL — ABNORMAL LOW (ref 4.22–5.81)
RDW: 13.7 % (ref 11.5–15.5)
WBC: 17.9 K/uL — ABNORMAL HIGH (ref 4.0–10.5)
nRBC: 0 % (ref 0.0–0.2)

## 2024-03-28 LAB — VITAMIN D 25 HYDROXY (VIT D DEFICIENCY, FRACTURES): Vit D, 25-Hydroxy: 67.32 ng/mL (ref 30–100)

## 2024-03-28 MED ORDER — PANTOPRAZOLE SODIUM 40 MG PO TBEC
40.0000 mg | DELAYED_RELEASE_TABLET | Freq: Every day | ORAL | Status: DC
  Administered 2024-03-28 – 2024-03-29 (×2): 40 mg via ORAL
  Filled 2024-03-28 (×2): qty 1

## 2024-03-28 MED ORDER — CELECOXIB 100 MG PO CAPS
100.0000 mg | ORAL_CAPSULE | Freq: Two times a day (BID) | ORAL | Status: DC
  Administered 2024-03-28 – 2024-03-29 (×3): 100 mg via ORAL
  Filled 2024-03-28 (×3): qty 1

## 2024-03-28 MED ORDER — GABAPENTIN 100 MG PO CAPS
100.0000 mg | ORAL_CAPSULE | Freq: Three times a day (TID) | ORAL | Status: DC
  Administered 2024-03-28 – 2024-03-29 (×3): 100 mg via ORAL
  Filled 2024-03-28 (×3): qty 1

## 2024-03-28 MED ORDER — APIXABAN 2.5 MG PO TABS
2.5000 mg | ORAL_TABLET | Freq: Two times a day (BID) | ORAL | Status: DC
  Administered 2024-03-29: 2.5 mg via ORAL
  Filled 2024-03-28: qty 1

## 2024-03-28 MED ORDER — TRAZODONE HCL 50 MG PO TABS
50.0000 mg | ORAL_TABLET | Freq: Every day | ORAL | Status: DC
  Administered 2024-03-28: 50 mg via ORAL
  Filled 2024-03-28: qty 1

## 2024-03-28 NOTE — Anesthesia Postprocedure Evaluation (Signed)
 Anesthesia Post Note  Patient: Darren Gonzales  Procedure(s) Performed: OPEN REDUCTION INTERNAL FIXATION (ORIF) TIBIAL PLATEAU (Right)     Patient location during evaluation: PACU Anesthesia Type: General Level of consciousness: sedated and patient cooperative Pain management: pain level controlled Vital Signs Assessment: post-procedure vital signs reviewed and stable Respiratory status: spontaneous breathing Cardiovascular status: stable Anesthetic complications: no   No notable events documented.  Last Vitals:  Vitals:   03/28/24 0753 03/28/24 1424  BP: (!) 105/48 114/60  Pulse: (!) 58 62  Resp: 18 18  Temp: 36.9 C 36.8 C  SpO2: 98% 99%    Last Pain:  Vitals:   03/28/24 1424  TempSrc: Oral  PainSc:                  Norleen Pope

## 2024-03-28 NOTE — Progress Notes (Addendum)
 PROGRESS NOTE  Darren Gonzales  DOB: [birth year]  PCP: Cleotilde Oneil FALCON, MD FMW:[id number]  DOA: 03/27/2024  LOS: 1 day  Hospital Day: 2  Subjective: Patient was seen and examined this morning. Pleasant elderly Caucasian male.  Propped up in bed.  Not in distress. Pain controlled.  Pending PT eval  Brief narrative: Darren Gonzales is a 88 y.o. male with PMH significant for HTN, OSA, COPD, SSS s/p PPM, CKD, chronic thrombocytopenia with h/o ITP, arthritis, pinched nerve on chronic pain medicines At baseline, lives at home alone. 10/5, patient was brought to the ED after a fall from a 6 feet height off a ladder to the floor..  Patient states he heard a pop to his right knee, sustained abrasion on the right elbow.  Unable to bear weight on right leg since then.  In the ED, afebrile, hemodynamically stable Labs with WC count of 20.5, platelet 126, hemoglobin 15, BUN/creatinine 60/2.12 CT head and CT cervical spine did not reveal any acute abnormality.  Showed multilevel cervical degenerative disc disease without spinal canal stenosis. Severe right C4 neural foraminal stenosis. X-rays of the right elbow did not reveal any acute fracture. Orthopedic consulted Admitted to [facility] 10/6, underwent ORIF right tibial plateau, arthrotomy right knee, anterior compartment fasciotomy.  Assessment and plan: Right tibial plateau fracture  S/p ORIF -10/6 Dr. Celena Secondary to fall from a ladder, 6 feet high Imaging and procedure as above Pain regimen: As below Bowel regimen: Senokot twice daily, DVT prophylaxis: Eliquis  2.5 mg twice daily per orthopedics  Acute on chronic pain Patient has chronic pain due to arthritis, pinched nerve and is chronically on pain meds  Acute worsening of pain due to fracture. Pain regimen --- Scheduled: Celebrex 100 mg twice daily, gabapentin 100 mg 3 times daily, --- PRN: Tylenol , Norco, IV morphine  Chronic diastolic CHF  essential hypertension PTA meds- Imdur   30 mg daily, Lasix 10 mg every other day blood pressures are currently controlled on Imdur  30 mg daily.  Lasix on hold. Last echocardiogram from 2023 with EF around 50%, mild LVH with moderate TR  and  mild AR, MR, and PR. Continue to monitor blood pressure   Sick sinus syndrome s/p PPM  CKD 4 Acute metabolic acidosis Creatinine close to baseline Serum bicarb low at 18  Recent Labs    04/07/23 0841 05/07/23 1317 03/26/24 2231 03/28/24 0432  BUN  --  27* 60* 56*  CREATININE 2.40* 1.96* 2.12* 2.13*  CO2  --  22 21* 18*   Hyperkalemia  potassium level elevated to 5.2 today.  Not on potassium supplement.  Continue to monitor. Recent Labs  Lab 03/26/24 2231 03/28/24 0432  K 4.7 5.2*   GlucoseCOPD Patient without wheezing appreciated on physical exam. Continue Singulair Albuterol  nebs as needed for shortness of breath/wheezing   Thrombocytopenia H/o ITP Chronic but seems worsening.  Continue to monitor.  Avoid heparin  products.  Recent Labs  Lab 03/26/24 2231 03/28/24 0432  PLT 126* 90*     History of DVT Patient with a prior history of DVT back in 01/2022 for which patient was placed on anticoagulation at that time possibly related to ITP.  Anticoagulation stopped as of 05/2023.  Followed by oncology in the outpatient setting.   RLS Continue pramipexole   Essential tremor Continue propranolol    Hyperlipidemia PTA meds-aspirin  81 mg every other day, Crestor .  Continue Crestor .   Because of excessive bruisability and thrombocytopenia, I would keep aspirin  on hold while on Eliquis   GERD PPI  BPH Flomax  to continue    Mobility: Pending PT eval PT Orders: Active   PT Follow up Rec:    Goals of care   Code Status: Full Code     DVT prophylaxis:  apixaban  (ELIQUIS ) tablet 2.5 mg Start: 03/29/24 1000 apixaban  (ELIQUIS ) tablet 2.5 mg   Antimicrobials: None Fluid: None Consultants: Orthopedics Family Communication: None at bedside  Status:  Inpatient Level of care:  Telemetry Surgical   Patient is from: Home Needs to continue in-hospital care: POD 1, pending PT eval Anticipated d/c to: Patient prefers home versus rehab   Diet:  Diet Order             Diet Heart Room service appropriate? Yes; Fluid consistency: Thin  Diet effective now                   Scheduled Meds:  [START ON 03/29/2024] apixaban   2.5 mg Oral BID   celecoxib  100 mg Oral BID   gabapentin  100 mg Oral TID   isosorbide  mononitrate  30 mg Oral Daily   montelukast  10 mg Oral QHS   pantoprazole   40 mg Oral Daily   pramipexole  0.25 mg Oral TID   propranolol  ER  60 mg Oral Daily   rosuvastatin   10 mg Oral QHS   senna-docusate  1 tablet Oral BID   traZODone  50 mg Oral QHS    PRN meds: acetaminophen , colchicine, HYDROcodone-acetaminophen , morphine injection   Infusions:    ceFAZolin  (ANCEF ) IV 2 g (03/28/24 0503)    Antimicrobials: Anti-infectives (From admission, onward)    Start     Dose/Rate Route Frequency Ordered Stop   03/27/24 2030  ceFAZolin  (ANCEF ) IVPB 2g/100 mL premix        2 g 200 mL/hr over 30 Minutes Intravenous Every 8 hours 03/27/24 1641 03/28/24 2159   03/27/24 1600  ceFAZolin  (ANCEF ) IVPB 2g/100 mL premix  Status:  Discontinued        2 g 200 mL/hr over 30 Minutes Intravenous Every 8 hours 03/27/24 1546 03/27/24 1640       Objective: Vitals:   03/28/24 0225 03/28/24 0753  BP: 112/64 (!) 105/48  Pulse: 63 (!) 58  Resp: 18 18  Temp: 97.8 F (36.6 C) 98.5 F (36.9 C)  SpO2: 97% 98%    Intake/Output Summary (Last 24 hours) at 03/28/2024 1348 Last data filed at 03/28/2024 0833 Gross per 24 hour  Intake 460 ml  Output 1125 ml  Net -665 ml   Filed Weights   03/27/24 0907  Weight: 80.3 kg   Weight change:  Body mass index is 26.14 kg/m.   Physical Exam: General exam: Pleasant, elderly Caucasian male.  Not in distress Skin: No rashes, lesions or ulcers. HEENT: Atraumatic, normocephalic, no obvious  bleeding Lungs: Clear to auscultation bilaterally,  CVS: S1, S2, no murmur,   GI/Abd: Soft, nontender, nondistended, bowel sound present,   CNS: Alert, awake, oriented x 3 Psychiatry: Mood appropriate Extremities: No pedal edema, no calf tenderness,   Data Review: I have personally reviewed the laboratory data and studies available.  F/u labs ordered Unresulted Labs (From admission, onward)     Start     Ordered   03/29/24 0500  CBC  Tomorrow morning,   R        03/28/24 1054   03/29/24 0500  Basic metabolic panel with GFR  Tomorrow morning,   R        03/28/24  1316            Signed, Chapman Rota, MD [facility] 03/28/2024

## 2024-03-28 NOTE — Evaluation (Signed)
 Occupational Therapy Evaluation Patient Details Name: Darren Gonzales MRN: [id number] DOB: 10-Oct-1935 Today's Date: 03/28/2024   History of Present Illness   Pt is an 88 y.o. male presenting 10/5 with RLE pain after fall from ladder. Found to have R tibial plateau fx with bicondylar extension. S/p ORIF R tibial plateau fx 10/6. PMH: HTN, SSS s/p PPM, COPD, CKD IIIb, arthritis.     Clinical Impressions PTA, pt lived alone and reports being independent, very active. Upon eval, pt grossly needing min A for OOB mobility, and mod A for LB ADL. Will continue to follow acutely. Due to significant change in status, recommend HHOT at discharge.      If plan is discharge home, recommend the following:   A little help with walking and/or transfers;A little help with bathing/dressing/bathroom;Assistance with cooking/housework;Assist for transportation;Help with stairs or ramp for entrance     Functional Status Assessment   Patient has had a recent decline in their functional status and demonstrates the ability to make significant improvements in function in a reasonable and predictable amount of time.     Equipment Recommendations   Tub/shower seat;Other (comment) (RW)     Recommendations for Other Services         Precautions/Restrictions   Precautions Precautions: Fall;Other (comment) Recall of Precautions/Restrictions: Intact Precaution/Restrictions Comments: unrestricted ROM of knee. Hinged knee brace donned, unlocked for OOB Required Braces or Orthoses: Other Brace (hinged RLE knee brace OOB) Restrictions Weight Bearing Restrictions Per Provider Order: Yes RLE Weight Bearing Per Provider Order: Non weight bearing     Mobility Bed Mobility Overal bed mobility: Needs Assistance Bed Mobility: Supine to Sit     Supine to sit: Mod assist     General bed mobility comments: dense directional cueing, assist for RLE management    Transfers Overall transfer level:  Needs assistance Equipment used: Rolling walker (2 wheels) Transfers: Sit to/from Stand, Bed to chair/wheelchair/BSC Sit to Stand: Min assist, +2 safety/equipment     Step pivot transfers: Min assist     General transfer comment: min A for rise; cues for hand placement      Balance Overall balance assessment: Needs assistance Sitting-balance support: No upper extremity supported, Feet supported Sitting balance-Leahy Scale: Good     Standing balance support: Bilateral upper extremity supported, During functional activity, Reliant on assistive device for balance Standing balance-Leahy Scale: Poor                             ADL either performed or assessed with clinical judgement   ADL Overall ADL's : Needs assistance/impaired Eating/Feeding: Independent;Sitting   Grooming: Set up;Sitting   Upper Body Bathing: Sitting;Modified independent   Lower Body Bathing: Moderate assistance;Sit to/from stand   Upper Body Dressing : Set up;Sitting   Lower Body Dressing: Moderate assistance;Sit to/from stand   Toilet Transfer: Minimal assistance;Stand-pivot;Rolling walker (2 wheels)           Functional mobility during ADLs: Minimal assistance       Vision         Perception Perception: Not tested       Praxis Praxis: Not tested       Pertinent Vitals/Pain Pain Assessment Pain Assessment: 0-10 Pain Score: 3  Pain Location: RLE Pain Descriptors / Indicators: Aching, Sore, Operative site guarding Pain Intervention(s): Monitored during session, Limited activity within patient's tolerance     Extremity/Trunk Assessment Upper Extremity Assessment Upper Extremity Assessment: Overall WFL for tasks  assessed   Lower Extremity Assessment Lower Extremity Assessment: Defer to PT evaluation       Communication Communication Communication: No apparent difficulties   Cognition Arousal: Alert Behavior During Therapy: WFL for tasks  assessed/performed Cognition: No apparent impairments                               Following commands: Intact (benefitted from additional instruction to optimize carryover)       Cueing  General Comments   Cueing Techniques: Verbal cues;Tactile cues;Gestural cues      Exercises     Shoulder Instructions      Home Living Family/patient expects to be discharged to:: Private residence Living Arrangements: Alone Available Help at Discharge: Family (3 sons live nearby) Type of Home: House Home Access: Level entry     Home Layout: One level     Bathroom Shower/Tub: Walk-in shower;Door   Bathroom Toilet: Handicapped height     Home Equipment: Grab bars - tub/shower;Grab bars - toilet   Additional Comments: reports son ordering him a hover round scooter      Prior Functioning/Environment Prior Level of Function : Independent/Modified Independent                    OT Problem List: Decreased strength;Decreased range of motion;Impaired balance (sitting and/or standing);Decreased knowledge of use of DME or AE;Decreased knowledge of precautions;Pain   OT Treatment/Interventions: Self-care/ADL training;Therapeutic exercise;DME and/or AE instruction;Therapeutic activities;Patient/family education;Balance training      OT Goals(Current goals can be found in the care plan section)   Acute Rehab OT Goals Patient Stated Goal: go home to have cookout at my house this weekend OT Goal Formulation: With patient Time For Goal Achievement: 04/11/24 Potential to Achieve Goals: Good   OT Frequency:  Min 2X/week    Co-evaluation              AM-PAC OT 6 Clicks Daily Activity     Outcome Measure Help from another person eating meals?: None Help from another person taking care of personal grooming?: A Little Help from another person toileting, which includes using toliet, bedpan, or urinal?: A Little Help from another person bathing (including  washing, rinsing, drying)?: A Little Help from another person to put on and taking off regular upper body clothing?: A Little Help from another person to put on and taking off regular lower body clothing?: A Lot 6 Click Score: 18   End of Session Equipment Utilized During Treatment: Gait belt;Rolling walker (2 wheels) (R hinged knee brace) Nurse Communication: Mobility status  Activity Tolerance: Patient tolerated treatment well Patient left: in chair;with call bell/phone within reach;with chair alarm set  OT Visit Diagnosis: Unsteadiness on feet (R26.81);Muscle weakness (generalized) (M62.81);History of falling (Z91.81);Pain Pain - Right/Left: Right Pain - part of body: Knee                Time: 1227-1306 OT Time Calculation (min): 39 min Charges:  OT General Charges $OT Visit: 1 Visit OT Evaluation $OT Eval Moderate Complexity: 1 Mod OT Treatments $Self Care/Home Management : 8-22 mins  Darren Gonzales FREDERICK, OTR/L [facility] Acute Rehabilitation Office: [phone]   Darren Gonzales 03/28/2024, 1:30 PM

## 2024-03-28 NOTE — Progress Notes (Addendum)
 Orthopaedic Trauma Service Progress Note  Patient ID: Darren Gonzales MRN: [id number] DOB/AGE: [birth year] 88 y.o.  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   Vitamin d levels look great   ROS As above  Today's  total administered Morphine Milligram Equivalents: 5 Yesterday's total administered Morphine Milligram Equivalents: 125  Objective:   VITALS:   Vitals:   03/27/24 1946 03/27/24 2348 03/28/24 0225 03/28/24 0753  BP: 118/62 (!) 104/52 112/64 (!) 105/48  Pulse: 62 (!) 58 63 (!) 58  Resp: 18 18 18 18   Temp: 99 F (37.2 C) 98.7 F (37.1 C) 97.8 F (36.6 C) 98.5 F (36.9 C)  TempSrc: Oral Oral Oral   SpO2: 100% 100% 97% 98%  Weight:      Height:        Estimated body mass index is 26.14 kg/m as calculated from the following:   Height as of this encounter: 5' 9 (1.753 m).   Weight as of this encounter: 80.3 kg.   Intake/Output      10/06 0701 10/07 0700 10/07 0701 10/08 0700   P.O. 120 240   I.V. (mL/kg) 400 (5)    IV Piggyback 10    Total Intake(mL/kg) 530 (6.6) 240 (3)   Urine (mL/kg/hr) 800 300 (1)   Blood 175    Total Output 975 300   Net -445 -60          LABS  Results for orders placed or performed during the hospital encounter of 03/27/24 (from the past 24 hours)  Type and screen [facility]     Status: None   Collection Time: 03/27/24  4:08 PM  Result Value Ref Range   ABO/RH(D) O POS    Antibody Screen NEG    Sample Expiration      03/30/2024,2359 Performed at [facility], [address] [place], [place] [address]   ABO/Rh     Status: None   Collection Time: 03/27/24  9:02 PM  Result Value Ref Range   ABO/RH(D)      O POS Performed at [facility], [address], [place], [place] [address]   CBC     Status: Abnormal   Collection Time: 03/28/24  4:32 AM  Result Value Ref Range   WBC 17.9 (H) 4.0 - 10.5 K/uL   RBC 3.50 (L) 4.22 - 5.81 MIL/uL   Hemoglobin 11.2 (L) 13.0 - 17.0 g/dL   HCT 66.0 (L) 60.9 - 47.9 %   MCV 96.9 80.0 - 100.0 fL   MCH 32.0 26.0 - 34.0 pg   MCHC 33.0 30.0 - 36.0 g/dL   RDW 86.2 88.4 - 84.4 %   Platelets 90 (L) 150 - 400 K/uL   nRBC 0.0 0.0 - 0.2 %  Basic metabolic panel     Status: Abnormal   Collection Time: 03/28/24  4:32 AM  Result Value Ref Range   Sodium 137 135 - 145 mmol/L   Potassium 5.2 (H) 3.5 - 5.1 mmol/L   Chloride 111 98 - 111 mmol/L   CO2 18 (L) 22 -  32 mmol/L   Glucose, Bld 148 (H) 70 - 99 mg/dL   BUN 56 (H) 8 - 23 mg/dL   Creatinine, Ser 7.86 (H) 0.61 - 1.24 mg/dL   Calcium  7.8 (L) 8.9 - 10.3 mg/dL   GFR, Estimated 30 (L) >60 mL/min   Anion gap 8 5 - 15  VITAMIN D 25 Hydroxy (Vit-D Deficiency, Fractures)     Status: None   Collection Time: 03/28/24  4:32 AM  Result Value Ref Range   Vit D, 25-Hydroxy 67.32 30 - 100 ng/mL     PHYSICAL EXAM:   Gen: sitting up in bed, NAD, looks good  Lungs: unlabored Cardiac: reg Ext:       Right Lower Extremity Dressing is clean, dry and intact  Utilizing bone foam   Extremity is warm  No DCT  Compartments are soft  No pain out of proportion with passive stretching of toes or ankle  DPN, SPN, TN sensory functions are intact  EHL, FHL, lesser toe motor functions intact  Ankle flexion, extension, inversion eversion intact  + DP pulse    Assessment/Plan: 1 Day Post-Op   Principal Problem:   Tibial plateau fracture Active Problems:   Essential tremor   Chronic diastolic CHF (congestive heart failure), NYHA class 3 (HCC)   Chronic kidney disease, stage III (moderate) (HCC)   COPD (chronic obstructive pulmonary disease) (HCC)   Essential hypertension   Mixed hyperlipidemia   S/P placement of cardiac pacemaker   Sinoatrial  node dysfunction (HCC)   Thrombocytopenia   History of DVT (deep vein thrombosis)   RLS (restless legs syndrome)   Anti-infectives (From admission, onward)    Start     Dose/Rate Route Frequency Ordered Stop   03/27/24 2030  ceFAZolin  (ANCEF ) IVPB 2g/100 mL premix        2 g 200 mL/hr over 30 Minutes Intravenous Every 8 hours 03/27/24 1641 03/28/24 2159   03/27/24 1600  ceFAZolin  (ANCEF ) IVPB 2g/100 mL premix  Status:  Discontinued        2 g 200 mL/hr over 30 Minutes Intravenous Every 8 hours 03/27/24 1546 03/27/24 1640     .  POD/HD#: 21  88 year old male fall off ladder with right tibial plateau fracture  -Closed displaced left tibial plateau fracture, depressed lateral plateau with bicondylar extension s/p ORIF right tibial plateau  Nonweightbearing right lower extremity for 6 to 8 weeks  Unrestricted range of motion right knee   Hinged brace only needs to be on when mobilizing   Zero knee bone foam while at rest  PT and OT evaluations  Ice and elevate for swelling and pain control  Dressing change prior to discharge  PT- please teach HEP for R knee ROM- AROM, PROM. Prone exercises as well. No ROM restrictions.  Quad sets, SLR, LAQ, SAQ, heel slides, stretching, prone flexion and extension  Ankle theraband program, heel cord stretching, toe towel curls, etc  No pillows under bend of knee when at rest, ok to place under heel to help work on extension. Can also use zero knee bone foam if available DO NOT LET KNEE REST IN FLEXION!!!!!  Hinged knee brace on at all times, unlocked.  Ok to work on [employer] without brace with therapist supervision.  Brace back on after ROM session if removed   - Pain management:  Multimodal  Minimize narcotics  - ABL anemia/Hemodynamics  Currently stable  However some mild thrombocytopenia  Monitor  - Medical issues   Per primary  -  DVT/PE prophylaxis:  Will hold Lovenox in the setting of mild thrombocytopenia  Will discharge on Eliquis   2.5 mg p.o. twice daily for 30 days  - ID:   Perioperative antibiotics - Metabolic Bone Disease:  Vitamin D levels look good  - Activity:  As above  - FEN/GI prophylaxis/Foley/Lines:  Regular diet  - Dispo:  Ortho issues are stable.  Therapy evaluations  Anticipate discharge tomorrow to home   Francis MICAEL Mt, PA-C [phone] (C) 03/28/2024, 10:46 AM  [facility] [address] [place] [address] [phone] MAXIMINO MILLING (F)    After 5pm and on the weekends please log on to Amion, go to orthopaedics and the look under the [facility] Call for the provider(s) on call. You can also call our office at [phone] and then follow the prompts to be connected to the call team.  Patient ID: Darren Gonzales, male   DOB: Nov 13, 1935, 88 y.o.   MRN: [id number]

## 2024-03-28 NOTE — Progress Notes (Signed)
 VAST consult received to obtain IV access. Pt working with PT. VAST RN will return later today to place access.

## 2024-03-28 NOTE — Telephone Encounter (Signed)
 Patient Product/process development scientist completed.    The patient is insured through [employer]/ [employer]. Patient has Medicare and is not eligible for a copay card, but may be able to apply for patient assistance or Medicare RX Payment Plan (Patient Must reach out to their plan, if eligible for payment plan), if available.    Ran test claim for Eliquis  5 mg and the current 30 day co-pay is $47.00.   This test claim was processed through [facility]- copay amounts may vary at other pharmacies due to pharmacy/plan contracts, or as the patient moves through the different stages of their insurance plan.     Reyes Sharps, CPHT Pharmacy Technician Patient Advocate Specialist Lead [facility] Patient Advocate Team Direct Number: [phone]  Fax: [phone]

## 2024-03-28 NOTE — Evaluation (Signed)
 Physical Therapy Evaluation  Patient Details Name: Darren Gonzales MRN: [id number] DOB: Apr 24, 1936 Today's Date: 03/28/2024  History of Present Illness  Pt is an 88 y.o. male presenting 03/26/24 with RLE pain after fall from ladder. Found to have R tibial plateau fx with bicondylar extension. S/p ORIF R tibial plateau fx 10/6. PMH significant for  HTN, SSS s/p PPM, COPD, CKD IIIb, arthritis.   Clinical Impression  Pt admitted with above diagnosis. Pt currently with functional limitations due to the deficits listed below (see PT Problem List). At the time of PT eval pt was motivated to participate. Hinged knee brace donned and adjusted for optimal fit. Pt reports minimal pain with brace donned. He demonstrated the ability to complete basic transfers with gross min assist (+2 for safety). Pt reports that his son has purchased him a [employer]. Recommend a RW for safe transfers at home and HHPT to follow up at d/c. Pt will benefit from acute skilled PT to increase their independence and safety with mobility to allow discharge.           If plan is discharge home, recommend the following: A little help with walking and/or transfers;A little help with bathing/dressing/bathroom;Assistance with cooking/housework;Assist for transportation;Help with stairs or ramp for entrance   Can travel by private vehicle        Equipment Recommendations Rolling walker (2 wheels);BSC/3in1  Recommendations for Other Services       Functional Status Assessment Patient has had a recent decline in their functional status and demonstrates the ability to make significant improvements in function in a reasonable and predictable amount of time.     Precautions / Restrictions Precautions Precautions: Fall;Other (comment) Recall of Precautions/Restrictions: Intact Precaution/Restrictions Comments: unrestricted ROM of knee. Hinged knee brace donned, unlocked for OOB Required Braces or Orthoses: Other Brace  (hinged RLE knee brace OOB) Restrictions Weight Bearing Restrictions Per Provider Order: Yes RLE Weight Bearing Per Provider Order: Non weight bearing      Mobility  Bed Mobility Overal bed mobility: Needs Assistance Bed Mobility: Supine to Sit     Supine to sit: Mod assist     General bed mobility comments: Assist for RLE movement towards EOB. Increased time but pt was able to scoot hips and sit EOB without assist for trunk management.    Transfers Overall transfer level: Needs assistance Equipment used: Rolling walker (2 wheels) Transfers: Sit to/from Stand, Bed to chair/wheelchair/BSC Sit to Stand: Min assist, +2 safety/equipment   Step pivot transfers: Min assist       General transfer comment: VC's for hand placement on seated surface for safety. Assist for balance support. Pt was able to take good steps around to the chair while maintaining NWB status on the R LE.    Ambulation/Gait                  Stairs            Wheelchair Mobility     Tilt Bed    Modified Rankin (Stroke Patients Only)       Balance Overall balance assessment: Needs assistance Sitting-balance support: No upper extremity supported, Feet supported Sitting balance-Leahy Scale: Good     Standing balance support: Bilateral upper extremity supported, During functional activity, Reliant on assistive device for balance Standing balance-Leahy Scale: Poor                               Pertinent  Vitals/Pain Pain Assessment Pain Assessment: Faces Faces Pain Scale: Hurts a little bit Pain Location: RLE Pain Descriptors / Indicators: Aching, Sore, Operative site guarding Pain Intervention(s): Limited activity within patient's tolerance, Monitored during session, Repositioned    Home Living Family/patient expects to be discharged to:: Private residence Living Arrangements: Alone Available Help at Discharge: Family (3 sons live nearby) Type of Home: House Home  Access: Level entry       Home Layout: One level Home Equipment: Grab bars - tub/shower;Grab bars - toilet Additional Comments: reports son ordering him a hover round scooter    Prior Function Prior Level of Function : Independent/Modified Independent                     Extremity/Trunk Assessment   Upper Extremity Assessment Upper Extremity Assessment: Overall WFL for tasks assessed    Lower Extremity Assessment Lower Extremity Assessment: RLE deficits/detail RLE Deficits / Details: Acute pain, decreased strength and AROM consistent with pre-op diagnosis and subsequent surgery.    Cervical / Trunk Assessment Cervical / Trunk Assessment: Normal  Communication   Communication Communication: No apparent difficulties    Cognition Arousal: Alert Behavior During Therapy: WFL for tasks assessed/performed                             Following commands: Intact (benefitted from additional instruction to optimize carryover)       Cueing Cueing Techniques: Verbal cues, Tactile cues, Gestural cues     General Comments      Exercises General Exercises - Lower Extremity Ankle Circles/Pumps: 10 reps Long Arc Quad: 5 reps, Limitations Long Arc Quad Limitations: Limited AROM   Assessment/Plan    PT Assessment Patient needs continued PT services  PT Problem List Decreased strength;Decreased activity tolerance;Decreased range of motion;Decreased balance;Decreased mobility;Decreased knowledge of use of DME;Decreased safety awareness;Decreased knowledge of precautions;Pain       PT Treatment Interventions DME instruction;Gait training;Stair training;Functional mobility training;Therapeutic activities;Therapeutic exercise;Cognitive remediation    PT Goals (Current goals can be found in the Care Plan section)  Acute Rehab PT Goals Patient Stated Goal: Back to hosting BBQ at his home and large family gatherings PT Goal Formulation: With patient Time For Goal  Achievement: 04/04/24 Potential to Achieve Goals: Good    Frequency Min 2X/week     Co-evaluation               AM-PAC PT 6 Clicks Mobility  Outcome Measure Help needed turning from your back to your side while in a flat bed without using bedrails?: A Little Help needed moving from lying on your back to sitting on the side of a flat bed without using bedrails?: A Little Help needed moving to and from a bed to a chair (including a wheelchair)?: A Little Help needed standing up from a chair using your arms (e.g., wheelchair or bedside chair)?: A Little Help needed to walk in hospital room?: A Lot Help needed climbing 3-5 steps with a railing? : A Lot 6 Click Score: 16    End of Session Equipment Utilized During Treatment: Gait belt Activity Tolerance: Patient tolerated treatment well Patient left: in chair;with call bell/phone within reach;with chair alarm set Nurse Communication: Mobility status PT Visit Diagnosis: Unsteadiness on feet (R26.81);Pain Pain - Right/Left: Right Pain - part of body: Knee;Leg    Time: 8772-8692 PT Time Calculation (min) (ACUTE ONLY): 40 min   Charges:   PT Evaluation $PT  Eval Moderate Complexity: 1 Mod   PT General Charges $$ ACUTE PT VISIT: 1 Visit         Leita Sable, PT, DPT Acute Rehabilitation Services Secure Chat Preferred Office: [phone]   Leita JONETTA Sable 03/28/2024, 3:15 PM

## 2024-03-29 ENCOUNTER — Encounter: Payer: Self-pay | Admitting: Internal Medicine

## 2024-03-29 ENCOUNTER — Other Ambulatory Visit (HOSPITAL_COMMUNITY): Payer: Self-pay

## 2024-03-29 LAB — CBC
HCT: 28.2 % — ABNORMAL LOW (ref 39.0–52.0)
Hemoglobin: 9.5 g/dL — ABNORMAL LOW (ref 13.0–17.0)
MCH: 32.2 pg (ref 26.0–34.0)
MCHC: 33.7 g/dL (ref 30.0–36.0)
MCV: 95.6 fL (ref 80.0–100.0)
Platelets: 89 K/uL — ABNORMAL LOW (ref 150–400)
RBC: 2.95 MIL/uL — ABNORMAL LOW (ref 4.22–5.81)
RDW: 13.9 % (ref 11.5–15.5)
WBC: 15.3 K/uL — ABNORMAL HIGH (ref 4.0–10.5)
nRBC: 0 % (ref 0.0–0.2)

## 2024-03-29 LAB — TYPE AND SCREEN
ABO/RH(D): O POS
Antibody Screen: NEGATIVE

## 2024-03-29 LAB — BASIC METABOLIC PANEL WITH GFR
Anion gap: 10 (ref 5–15)
BUN: 57 mg/dL — ABNORMAL HIGH (ref 8–23)
CO2: 18 mmol/L — ABNORMAL LOW (ref 22–32)
Calcium: 8 mg/dL — ABNORMAL LOW (ref 8.9–10.3)
Chloride: 112 mmol/L — ABNORMAL HIGH (ref 98–111)
Creatinine, Ser: 1.87 mg/dL — ABNORMAL HIGH (ref 0.61–1.24)
GFR, Estimated: 35 mL/min — ABNORMAL LOW (ref >60)
Glucose, Bld: 125 mg/dL — ABNORMAL HIGH (ref 70–99)
Potassium: 4.9 mmol/L (ref 3.5–5.1)
Sodium: 140 mmol/L (ref 135–145)

## 2024-03-29 MED ORDER — HYDROCODONE-ACETAMINOPHEN 5-325 MG PO TABS
1.0000 | ORAL_TABLET | Freq: Three times a day (TID) | ORAL | 0 refills | Status: AC | PRN
  Filled 2024-03-29: qty 30, 5d supply, fill #0

## 2024-03-29 MED ORDER — APIXABAN 2.5 MG PO TABS
2.5000 mg | ORAL_TABLET | Freq: Two times a day (BID) | ORAL | 0 refills | Status: AC
  Filled 2024-03-29: qty 60, 30d supply, fill #0

## 2024-03-29 MED ORDER — SENNOSIDES-DOCUSATE SODIUM 8.6-50 MG PO TABS
1.0000 | ORAL_TABLET | Freq: Two times a day (BID) | ORAL | 0 refills | Status: AC
  Filled 2024-03-29: qty 60, 30d supply, fill #0

## 2024-03-29 NOTE — Progress Notes (Signed)
 Orthopedic Tech Progress Note Patient Details:  Darren Gonzales [birth year] [id number]  Called in order to [employer] for a COMPRESSION GRADUATED STOCKING (15-20 mmHg)  Patient ID: [name], male   DOB: 11-Apr-1936, 88 y.o.   MRN: [id number]  Delanna LITTIE Pac 03/29/2024, 9:45 AM

## 2024-03-29 NOTE — Discharge Summary (Signed)
 Physician Discharge Summary  Darren Gonzales FMW:[id number] DOB: October 16, 1935 DOA: 03/27/2024  PCP: Cleotilde Oneil FALCON, MD  Admit date: 03/27/2024 Discharge date: 03/29/2024  Admitted from: Home Discharge disposition: Home with home health  Recommendations at discharge:  For DVT prophylaxis, you have been started on Eliquis  2.5 mg twice daily for 28 days.  While on Eliquis , do not take aspirin .  After Eliquis  course is complete, can resume aspirin  as before. Follow-up with orthopedics as an outpatient  Subjective: Patient was seen and examined this morning. Pleasant elderly Caucasian male sitting up in recliner.  Not in distress.  Seen by orthopedics today. Ready to go home.  Remains afebrile, hemodynamically stable Labs from this morning with WC count.  15.3, hemoglobin 9.5, platelet 289, creatinine better at 1.87 Seen by PT/OT.  Home health PT recommended  Brief narrative: Darren Gonzales is a 88 y.o. male with PMH significant for HTN, OSA, COPD, SSS s/p PPM, CKD, chronic thrombocytopenia with h/o ITP, arthritis, pinched nerve on chronic pain medicines At baseline, lives at home alone. 10/5, patient was brought to the ED after a fall from a 6 feet height off a ladder to the floor..  Patient states he heard a pop to his right knee, sustained abrasion on the right elbow.  Unable to bear weight on right leg since then.  In the ED, afebrile, hemodynamically stable Labs with WC count of 20.5, platelet 126, hemoglobin 15, BUN/creatinine 60/2.12 CT head and CT cervical spine did not reveal any acute abnormality.  Showed multilevel cervical degenerative disc disease without spinal canal stenosis. Severe right C4 neural foraminal stenosis. X-rays of the right elbow did not reveal any acute fracture. Orthopedic consulted Admitted to [facility] 10/6, underwent ORIF right tibial plateau, arthrotomy right knee, anterior compartment fasciotomy.  Hospital course: Right tibial plateau fracture  S/p  ORIF -10/6 Dr. Celena Secondary to fall from a ladder, 6 feet high Imaging and procedure as above Pain regimen: As below Bowel regimen: Senokot twice daily, DVT prophylaxis: Eliquis  2.5 mg twice daily per orthopedics PT/OT eval obtained.  Home health PT recommended.  Lives at home alone.  Does not want to go to SNF  Acute on chronic pain Patient has chronic pain due to arthritis, pinched nerve and is chronically on pain meds  Acute worsening of pain due to fracture. Pain regimen --- Scheduled: Celebrex 100 mg twice daily, gabapentin 100 mg 3 times daily, --- PRN: Norco  Chronic diastolic CHF  essential hypertension PTA meds- Imdur  30 mg daily, Lasix 10 mg every other day Blood pressure remains controlled on the same. Last echocardiogram from 2023 with EF around 50%, mild LVH with moderate TR  and  mild AR, MR, and PR. Continue to monitor    Sick sinus syndrome  s/p PPM  CKD 4 Acute metabolic acidosis Creatinine close to baseline Serum bicarb low at 18  Recent Labs    04/07/23 0841 05/07/23 1317 03/26/24 2231 03/28/24 0432 03/29/24 0424  BUN  --  27* 60* 56* 57*  CREATININE 2.40* 1.96* 2.12* 2.13* 1.87*  CO2  --  22 21* 18* 18*   Hyperkalemia  potassium level improved from 5.2 yesterday to 4.9 today. Recent Labs  Lab 03/26/24 2231 03/28/24 0432 03/29/24 0424  K 4.7 5.2* 4.9   COPD Patient without wheezing appreciated on physical exam. Continue Singulair Albuterol  nebs as needed for shortness of breath/wheezing   Thrombocytopenia H/o ITP Chronic but seems worsening.  Continue to monitor.  Avoid heparin  products.  Recent Labs  Lab 03/26/24 2231 03/28/24 0432 03/29/24 0424  PLT 126* 90* 89*     History of DVT Patient with a prior history of DVT back in 01/2022 for which patient was placed on anticoagulation at that time possibly related to ITP.  Anticoagulation stopped as of 05/2023.  Followed by oncology in the outpatient setting.   RLS Continue  pramipexole   Essential tremor Continue propranolol    Hyperlipidemia PTA meds-aspirin  81 mg every other day, Crestor .  Continue Crestor .   Because of excessive bruisability and thrombocytopenia, I would keep aspirin  on hold while on Eliquis . Resume aspirin  after Eliquis  course is complete.  GERD PPI  BPH Flomax  to continue  Goals of care   Code Status: Full Code   Diet:  Diet Order             Diet general           Diet Heart Room service appropriate? Yes; Fluid consistency: Thin  Diet effective now                   Nutritional status:  Body mass index is 26.14 kg/m.       Wounds:  - Wound 03/27/24 1423 Surgical Closed Surgical Incision Leg (Active)  Date First Assessed/Time First Assessed: 03/27/24 1423   Primary Wound Type: Surgical  Secondary Wound Type - Surgical: Closed Surgical Incision  Location: Leg    Assessments 03/27/2024  2:32 PM 03/28/2024  9:00 PM  Site / Wound Assessment Dressing in place / Unable to assess Dressing in place / Unable to assess  Drainage Amount None None  Dressing Type Abdominal pads;Compression wrap Compression wrap  Dressing Changed New --  Dressing Status Clean, Dry, Intact Clean, Dry, Intact     No associated orders.    Discharge Medications:   Allergies as of 03/29/2024       Reactions   Lipitor [atorvastatin] Other (See Comments)   Joint pain        Medication List     PAUSE taking these medications    aspirin  81 MG tablet Wait to take this until: April 26, 2024 Take 81 mg by mouth every other day.       STOP taking these medications    traMADol 50 MG tablet Commonly known as: ULTRAM       TAKE these medications    acetaminophen  325 MG tablet Commonly known as: TYLENOL  Take 650 mg by mouth every 6 (six) hours as needed for mild pain, moderate pain, fever or headache.   apixaban  2.5 MG Tabs tablet Commonly known as: ELIQUIS  Take 1 tablet (2.5 mg total) by mouth 2 (two) times daily.    Biotin 1 MG Caps Take 1 mg by mouth daily.   celecoxib 100 MG capsule Commonly known as: CELEBREX Take 100 mg by mouth 2 (two) times daily.   Cholecalciferol 25 MCG (1000 UT) tablet Take 1,000 Units by mouth daily.   colchicine 0.6 MG tablet Take 0.6 mg by mouth daily as needed (Gout).   furosemide 20 MG tablet Commonly known as: LASIX Take 10 mg by mouth every other day.   gabapentin 100 MG capsule Commonly known as: NEURONTIN Take 100 mg by mouth 3 (three) times daily.   HYDROcodone-acetaminophen  5-325 MG tablet Commonly known as: NORCO/VICODIN Take 1-2 tablets by mouth every 8 (eight) hours as needed for moderate pain (pain score 4-6) or severe pain (pain score 7-10). What changed:  how much to take when to take  this reasons to take this   isosorbide  mononitrate 30 MG 24 hr tablet Commonly known as: IMDUR  Take 30 mg by mouth daily.   montelukast 10 MG tablet Commonly known as: SINGULAIR Take 10 mg by mouth at bedtime.   omeprazole 40 MG capsule Commonly known as: PRILOSEC Take 40 mg by mouth every other day.   pramipexole 0.25 MG tablet Commonly known as: MIRAPEX Take 0.25 mg by mouth 3 (three) times daily.   propranolol  ER 60 MG 24 hr capsule Commonly known as: INDERAL  LA Take 60 mg by mouth at bedtime.   rosuvastatin  10 MG tablet Commonly known as: CRESTOR  Take 10 mg by mouth at bedtime.   senna-docusate 8.6-50 MG tablet Commonly known as: Senokot-S Take 1 tablet by mouth 2 (two) times daily.   traZODone 50 MG tablet Commonly known as: DESYREL Take 50 mg by mouth at bedtime.               Durable Medical Equipment  (From admission, onward)           Start     Ordered   03/28/24 1712  For home use only DME Bedside commode  Once       Comments: CONFINE TO ONE ROOM  Question:  Patient needs a bedside commode to treat with the following condition  Answer:  Gait instability   03/28/24 1712   03/28/24 1703  For home use only DME Walker  rolling  Once       Question Answer Comment  Walker: With 5 Inch Wheels   Patient needs a walker to treat with the following condition Gait instability      03/28/24 1703   03/28/24 1703  For home use only DME Tub bench  Once       Comments: TUB /SHOWER SEAT   03/28/24 1703              Discharge Care Instructions  (From admission, onward)           Start     Ordered   03/29/24 0000  Discharge wound care:        03/29/24 1202             Follow ups:    Follow-up Information     Celena Sharper, MD. Schedule an appointment as soon as possible for a visit in 2 week(s).   Specialty: Orthopedic Surgery Contact information: [address] [place] [address] [phone]         Cleotilde Oneil FALCON, MD Follow up.   Specialty: Internal Medicine Contact information: [phone] [facility] [address] [facility] [place] [address] [phone]                 Discharge Instructions:   Discharge Instructions     Call MD for:  difficulty breathing, headache or visual disturbances   Complete by: As directed    Call MD for:  extreme fatigue   Complete by: As directed    Call MD for:  hives   Complete by: As directed    Call MD for:  persistant dizziness or light-headedness   Complete by: As directed    Call MD for:  persistant nausea and vomiting   Complete by: As directed    Call MD for:  severe uncontrolled pain   Complete by: As directed    Call MD for:  temperature >100.4   Complete by: As directed    Diet general   Complete  by: As directed    Discharge instructions   Complete by: As directed    Recommendations at discharge:   For DVT prophylaxis, you have been started on Eliquis  2.5 mg twice daily for 28 days.  While on Eliquis , do not take aspirin .  After Eliquis  course is complete, can resume aspirin  as before.  Follow-up with orthopedics as an outpatient   You have been started on a blood thinner.  Please watch out  for any obvious bleeding, black stool or easy bruisability.  Please contact your primary care provider for further recommendation.    PDMP reviewed this encounter.   Opioid taper instructions: It is important to wean off of your opioid medication as soon as possible. If you do not need pain medication after your surgery it is ok to stop day one. Opioids include: Codeine, Hydrocodone(Norco, Vicodin), Oxycodone(Percocet, oxycontin) and hydromorphone amongst others.  Long term and even short term use of opiods can cause: Increased pain response Dependence Constipation Depression Respiratory depression And more.  Withdrawal symptoms can include Flu like symptoms Nausea, vomiting And more Techniques to manage these symptoms Hydrate well Eat regular healthy meals Stay active Use relaxation techniques(deep breathing, meditating, yoga) Do Not substitute Alcohol to help with tapering If you have been on opioids for less than two weeks and do not have pain than it is ok to stop all together.  Plan to wean off of opioids This plan should start within one week post op of your joint replacement. Maintain the same interval or time between taking each dose and first decrease the dose.  Cut the total daily intake of opioids by one tablet each day Next start to increase the time between doses. The last dose that should be eliminated is the evening dose.        General discharge instructions: Follow with Primary MD Cleotilde Oneil FALCON, MD in 7 days  Please request your PCP  to go over your hospital tests, procedures, radiology results at the follow up. Please get your medicines reviewed and adjusted.  Your PCP may decide to repeat certain labs or tests as needed. Do not drive, operate heavy machinery, perform activities at heights, swimming or participation in water activities or provide baby sitting services if your were admitted for syncope or siezures until you have seen by Primary MD or a  Neurologist and advised to do so again. [place]  Controlled Substance Reporting System database was reviewed. Do not drive, operate heavy machinery, perform activities at heights, swim, participate in water activities or provide baby-sitting services while on medications for pain, sleep and mood until your outpatient physician has reevaluated you and advised to do so again.  You are strongly recommended to comply with the dose, frequency and duration of prescribed medications. Activity: As tolerated with Full fall precautions use walker/cane & assistance as needed Avoid using any recreational substances like cigarette, tobacco, alcohol, or non-prescribed drug. If you experience worsening of your admission symptoms, develop shortness of breath, life threatening emergency, suicidal or homicidal thoughts you must seek medical attention immediately by calling 911 or calling your MD immediately  if symptoms less severe. You must read complete instructions/literature along with all the possible adverse reactions/side effects for all the medicines you take and that have been prescribed to you. Take any new medicine only after you have completely understood and accepted all the possible adverse reactions/side effects.  Wear Seat belts while driving. You were cared for by a hospitalist during your hospital stay. If  you have any questions about your discharge medications or the care you received while you were in the hospital after you are discharged, you can call the unit and ask to speak with the hospitalist or the covering physician. Once you are discharged, your primary care physician will handle any further medical issues. Please note that NO REFILLS for any discharge medications will be authorized once you are discharged, as it is imperative that you return to your primary care physician (or establish a relationship with a primary care physician if you do not have one).   Discharge wound care:   Complete  by: As directed    Increase activity slowly   Complete by: As directed        Discharge Exam:   Vitals:   03/28/24 1424 03/28/24 2034 03/29/24 0352 03/29/24 0749  BP: 114/60 105/63 (!) 113/59 (!) 122/54  Pulse: 62 66 63 62  Resp: 18 16 16 20   Temp: 98.2 F (36.8 C) 98.2 F (36.8 C) 98.2 F (36.8 C) 97.9 F (36.6 C)  TempSrc:  Oral Oral   SpO2: 99% 100% 100% 100%  Weight:      Height:        Body mass index is 26.14 kg/m.   General exam: Pleasant, elderly Caucasian male.  Not in distress Skin: No rashes, lesions or ulcers. HEENT: Atraumatic, normocephalic, no obvious bleeding Lungs: Clear to auscultation bilaterally,  CVS: S1, S2, no murmur,   GI/Abd: Soft, nontender, nondistended, bowel sound present,   CNS: Alert, awake, oriented x 3 Psychiatry: Mood appropriate Extremities: No pedal edema, no calf tenderness,    The results of significant diagnostics from this hospitalization (including imaging, microbiology, ancillary and laboratory) are listed below for reference.    Procedures and Diagnostic Studies:   DG Knee Right Port Result Date: 03/27/2024 CLINICAL DATA:  Fracture, postop. EXAM: PORTABLE RIGHT KNEE - 1-2 VIEW COMPARISON:  Preoperative imaging FINDINGS: Lateral plate and screw fixation of comminuted tibial plateau fracture. Improved fracture alignment from preoperative imaging. Recent postsurgical change includes air and edema in the joint space and soft tissues. IMPRESSION: ORIF of comminuted tibial plateau fracture. Electronically Signed   By: Andrea Gasman M.D.   On: 03/27/2024 15:12   DG Knee Complete 4 Views Right Result Date: 03/27/2024 CLINICAL DATA:  Elective surgery. EXAM: RIGHT KNEE - COMPLETE 4+ VIEW COMPARISON:  Preoperative imaging FINDINGS: Six fluoroscopic spot views of the knee submitted from the operating room. Lateral plate and screw fixation of proximal tibial fracture. Fluoroscopy time 53 seconds. Dose 4 mGy. IMPRESSION: Intraoperative  fluoroscopy during proximal tibial fracture ORIF. Electronically Signed   By: Andrea Gasman M.D.   On: 03/27/2024 15:10   DG C-Arm 1-60 Min-No Report Result Date: 03/27/2024 Fluoroscopy was utilized by the requesting physician.  No radiographic interpretation.   DG C-Arm 1-60 Min-No Report Result Date: 03/27/2024 Fluoroscopy was utilized by the requesting physician.  No radiographic interpretation.   CT KNEE RIGHT WO CONTRAST Result Date: 03/27/2024 EXAM: CT RIGHT KNEE, WITHOUT IV CONTRAST 03/27/2024 10:09:00 AM TECHNIQUE: Axial images were acquired through the right knee without IV contrast. Reformatted images were reviewed. Automated exposure control, iterative reconstruction, and/or weight based adjustment of the mA/kV was utilized to reduce the radiation dose to as low as reasonably achievable. COMPARISON: None provided. CLINICAL HISTORY: Knee trauma, tibial plateau fracture. FINDINGS: BONES: Substantially comminuted tibial plateau fracture noted with substantial vertical components in the lateral tibial plateau; a depressed lateral tibial plateau component with up to about  1.1 cm depression and extensive comminution along the lateral tibial plateau surface; comminuted on the tibial spine with involvement anteriorly and especially posteriorly; and some extension into the medial compartmental articular surface especially posteriorly. There is some splaying of the lateral tibial plateau vertical fracture associated with the impaction. Longitudinal extension of fracture posterolaterally with loss of visibility about 10.8 cm below the joint line. Irregularity of the proximal fibular tip suspicious for a fracture, for example on image 51 series 14. No femoral or patellar fracture identified. JOINTS: Mild patellofemoral spurring. Meniscal chondrocalcinosis with faint chondrocalcinosis along the PCL. The PCL attaches to a small fragment of posterior tibial spine. SOFT TISSUES: Lipohemothorax.  Atherosclerosis. IMPRESSION: 1. Substantially comminuted tibial plateau fracture with lateral compartmental splaying and depression comminuted involvement of the tibial spine, and extension into the medial tibial plateau articular surface. Longitudinal fracture component extends distally posterolaterally. 2. Irregularity of the proximal fibular tip suspicious for a fracture. 3. Lipohemarthrosis. 4. Mild patellofemoral osteoarthrosis. 5. Meniscal chondrocalcinosis. Electronically signed by: Ryan Salvage MD 03/27/2024 02:30 PM EDT RP Workstation: [id number]     Labs:   Basic Metabolic Panel: Recent Labs  Lab 03/26/24 2231 03/28/24 0432 03/29/24 0424  NA 141 137 140  K 4.7 5.2* 4.9  CL 109 111 112*  CO2 21* 18* 18*  GLUCOSE 95 148* 125*  BUN 60* 56* 57*  CREATININE 2.12* 2.13* 1.87*  CALCIUM  8.3* 7.8* 8.0*   GFR Estimated Creatinine Clearance: 28.4 mL/min (A) (by C-G formula based on SCr of 1.87 mg/dL (H)). Liver Function Tests: Recent Labs  Lab 03/26/24 2231  AST 35  ALT 47*  ALKPHOS 51  BILITOT 1.4*  PROT 5.8*  ALBUMIN  3.3*   No results for input(s): LIPASE, AMYLASE in the last 168 hours. No results for input(s): AMMONIA in the last 168 hours. Coagulation profile No results for input(s): INR, PROTIME in the last 168 hours.  CBC: Recent Labs  Lab 03/26/24 2231 03/28/24 0432 03/29/24 0424  WBC 20.5* 17.9* 15.3*  NEUTROABS 17.2*  --   --   HGB 15.0 11.2* 9.5*  HCT 44.9 33.9* 28.2*  MCV 94.9 96.9 95.6  PLT 126* 90* 89*   Cardiac Enzymes: No results for input(s): CKTOTAL, CKMB, CKMBINDEX, TROPONINI in the last 168 hours. BNP: Invalid input(s): POCBNP CBG: No results for input(s): GLUCAP in the last 168 hours. D-Dimer No results for input(s): DDIMER in the last 72 hours. Hgb A1c No results for input(s): HGBA1C in the last 72 hours. Lipid Profile No results for input(s): CHOL, HDL, LDLCALC, TRIG, CHOLHDL, LDLDIRECT in  the last 72 hours. Thyroid  function studies No results for input(s): TSH, T4TOTAL, T3FREE, THYROIDAB in the last 72 hours.  Invalid input(s): FREET3 Anemia work up No results for input(s): VITAMINB12, FOLATE, FERRITIN, TIBC, IRON, RETICCTPCT in the last 72 hours. Microbiology No results found for this or any previous visit (from the past 240 hours).  Time coordinating discharge: 45 minutes  Signed: Danzig Macgregor  [facility] 03/29/2024, 12:02 PM

## 2024-03-29 NOTE — Discharge Instructions (Addendum)
 Orthopaedic Trauma Service Discharge Instructions   General Discharge Instructions  Orthopaedic Injuries:  Right tibial plateau fracture treated with open reduction and internal fixation using plate and screws  WEIGHT BEARING STATUS: Nonweightbearing right leg for 6 to 8 weeks.  Use walker to mobilize  RANGE OF MOTION/ACTIVITY: Unrestricted range of motion of right knee.  Hinged knee brace only needs to be on when mobilizing.  Do not let knee rest in flexion.  Activity as tolerated while maintaining weightbearing restrictions  Continue to do home exercise program that therapy taught you 2-3 times a day  Bone health: Vitamin D levels look excellent  Review the following resource for additional information regarding bone health  [email or web address].cy  Wound Care: Daily or every other day dressing changes starting on 03/31/2024.  Please see below  Discharge Wound Care Instructions  Do NOT apply any ointments, solutions or lotions to pin sites or surgical wounds.  These prevent needed drainage and even though solutions like hydrogen peroxide kill bacteria, they also damage cells lining the pin sites that help fight infection.  Applying lotions or ointments can keep the wounds moist and can cause them to breakdown and open up as well. This can increase the risk for infection. When in doubt call the office.  Surgical incisions should be dressed daily.  If any drainage is noted, use one layer of adaptic or Mepitel, then gauze, Kerlix, and an ace wrap.  Alternatively you can use a silicone foam dressing such as a Mepilex which is which you currently have on as well as a compression sock instead of the Ace  wrap  NetCamper.cz [email or web address]://dennis-soto.com/?pd_rd_[email or web address]&th=1  [email or web address].com/  These dressing supplies should be available at local medical supply stores ([facility] [employer], [facility], etc). They are not usually carried at places like [facility], [facility], [facility], etc  Once the incision is completely dry and without drainage, it may be left open to air out.  Showering may begin 36-48 hours later.  Cleaning gently with soap and water.  Traumatic wounds should be dressed daily as well.    One layer of adaptic, gauze, Kerlix, then ace wrap.  The adaptic can be discontinued once the draining has ceased    If you have a wet to dry dressing: wet the gauze with saline the squeeze as much saline out so the gauze is moist (not soaking wet), place moistened gauze over wound, then place a dry gauze over the moist one, followed by Kerlix wrap, then ace wrap.  DVT/PE prophylaxis: Eliquis  2.5 mg every 12 hours for 30 days for blood clot prevention.  Resume aspirin  after you have completed your Eliquis   Diet: as you were eating previously.  Can use over the counter stool softeners and bowel preparations, such as Miralax, to help with bowel movements.  Narcotics can be constipating.  Be sure to drink plenty of fluids  PAIN MEDICATION USE AND EXPECTATIONS  You have likely been given narcotic medications to help control your pain.  After a traumatic event that results in an fracture (broken bone) with or without surgery, it is ok to use narcotic pain medications to help control one's pain.  We understand that everyone responds to pain differently and each individual patient will be evaluated on a regular basis for the continued need for narcotic medications. Ideally, narcotic medication use should last no more than  6-8 weeks (coinciding with fracture healing).   As a patient it is your responsibility as well to monitor narcotic medication use and report the  amount and frequency you use these medications when you come to your office visit.   We would also advise that if you are using narcotic medications, you should take a dose prior to therapy to maximize you participation.  IF YOU ARE ON NARCOTIC MEDICATIONS IT IS NOT PERMISSIBLE TO OPERATE A MOTOR VEHICLE (MOTORCYCLE/CAR/TRUCK/MOPED) OR HEAVY MACHINERY DO NOT MIX NARCOTICS WITH OTHER CNS (CENTRAL NERVOUS SYSTEM) DEPRESSANTS SUCH AS ALCOHOL   POST-OPERATIVE OPIOID TAPER INSTRUCTIONS: It is important to wean off of your opioid medication as soon as possible. If you do not need pain medication after your surgery it is ok to stop day one. Opioids include: Codeine, Hydrocodone(Norco, Vicodin), Oxycodone(Percocet, oxycontin) and hydromorphone amongst others.  Long term and even short term use of opiods can cause: Increased pain response Dependence Constipation Depression Respiratory depression And more.  Withdrawal symptoms can include Flu like symptoms Nausea, vomiting And more Techniques to manage these symptoms Hydrate well Eat regular healthy meals Stay active Use relaxation techniques(deep breathing, meditating, yoga) Do Not substitute Alcohol to help with tapering If you have been on opioids for less than two weeks and do not have pain than it is ok to stop all together.  Plan to wean off of opioids This plan should start within one week post op of your fracture surgery  Maintain the same interval or time between taking each dose and first decrease the dose.  Cut the total daily intake of opioids by one tablet each day Next start to increase the time between doses. The last dose that should be eliminated is the evening dose.    STOP SMOKING OR USING NICOTINE PRODUCTS!!!!  As discussed nicotine severely impairs your body's ability to  heal surgical and traumatic wounds but also impairs bone healing.  Wounds and bone heal by forming microscopic blood vessels (angiogenesis) and nicotine is a vasoconstrictor (essentially, shrinks blood vessels).  Therefore, if vasoconstriction occurs to these microscopic blood vessels they essentially disappear and are unable to deliver necessary nutrients to the healing tissue.  This is one modifiable factor that you can do to dramatically increase your chances of healing your injury.    (This means no smoking, no nicotine gum, patches, etc)  DO NOT USE NONSTEROIDAL ANTI-INFLAMMATORY DRUGS (NSAID'S)  Using products such as Advil (ibuprofen), Aleve (naproxen), Motrin (ibuprofen) for additional pain control during fracture healing can delay and/or prevent the healing response.  If you would like to take over the counter (OTC) medication, Tylenol  (acetaminophen ) is ok.  However, some narcotic medications that are given for pain control contain acetaminophen  as well. Therefore, you should not exceed more than 4000 mg of tylenol  in a day if you do not have liver disease.  Also note that there are may OTC medicines, such as cold medicines and allergy medicines that my contain tylenol  as well.  If you have any questions about medications and/or interactions please ask your doctor/PA or your pharmacist.      ICE AND ELEVATE INJURED/OPERATIVE EXTREMITY  Using ice and elevating the injured extremity above your heart can help with swelling and pain control.  Icing in a pulsatile fashion, such as 20 minutes on and 20 minutes off, can be followed.    Do not place ice directly on skin. Make sure there is a barrier between to skin and the ice pack.    Using frozen items such as frozen peas works well as the conform nicely to the are that needs to be iced.  USE AN ACE  WRAP OR TED HOSE FOR SWELLING CONTROL  In addition to icing and elevation, Ace wraps or TED hose are used to help limit and resolve swelling.  It is  recommended to use Ace wraps or TED hose until you are informed to stop.    When using Ace Wraps start the wrapping distally (farthest away from the body) and wrap proximally (closer to the body)   Example: If you had surgery on your leg and you do not have a splint on, start the ace wrap at the toes and work your way up to the thigh        If you had surgery on your upper extremity and do not have a splint on, start the ace wrap at your fingers and work your way up to the upper arm  IF YOU ARE IN A SPLINT OR CAST DO NOT REMOVE IT FOR ANY REASON   If your splint gets wet for any reason please contact the office immediately. You may shower in your splint or cast as long as you keep it dry.  This can be done by wrapping in a cast cover or garbage back (or similar)  Do Not stick any thing down your splint or cast such as pencils, money, or hangers to try and scratch yourself with.  If you feel itchy take benadryl as prescribed on the bottle for itching  IF YOU ARE IN A CAM BOOT (BLACK BOOT)  You may remove boot periodically. Perform daily dressing changes as noted below.  Wash the liner of the boot regularly and wear a sock when wearing the boot. It is recommended that you sleep in the boot until told otherwise    Call office for the following: Temperature greater than 101F Persistent nausea and vomiting Severe uncontrolled pain Redness, tenderness, or signs of infection (pain, swelling, redness, odor or green/yellow discharge around the site) Difficulty breathing, headache or visual disturbances Hives Persistent dizziness or light-headedness Extreme fatigue Any other questions or concerns you may have after discharge  In an emergency, call 911 or go to an Emergency Department at a nearby hospital  HELPFUL INFORMATION  If you had a block, it will wear off between 8-24 hrs postop typically.  This is period when your pain may go from nearly zero to the pain you would have had postop without  the block.  This is an abrupt transition but nothing dangerous is happening.  You may take an extra dose of narcotic when this happens.  You should wean off your narcotic medicines as soon as you are able.  Most patients will be off or using minimal narcotics before their first postop appointment.   We suggest you use the pain medication the first night prior to going to bed, in order to ease any pain when the anesthesia wears off. You should avoid taking pain medications on an empty stomach as it will make you nauseous.  Do not drink alcoholic beverages or take illicit drugs when taking pain medications.  In most states it is against the law to drive while you are in a splint or sling.  And certainly against the law to drive while taking narcotics.  You may return to work/school in the next couple of days when you feel up to it.   Pain medication may make you constipated.  Below are a few solutions to try in this order: Decrease the amount of pain medication if you aren't having pain. Drink lots of decaffeinated fluids. Drink prune  juice and/or each dried prunes  If the first 3 don't work start with additional solutions Take Colace - an over-the-counter stool softener Take Senokot - an over-the-counter laxative Take Miralax - a stronger over-the-counter laxative     CALL THE OFFICE WITH ANY QUESTIONS OR CONCERNS: [phone]   VISIT OUR WEBSITE FOR ADDITIONAL INFORMATION: [email or web address]

## 2024-03-29 NOTE — Progress Notes (Signed)
 Orthopaedic Trauma Service Progress Note  Patient ID: Darren Gonzales MRN: [id number] DOB/AGE: [birth year] 88 y.o.  Subjective:  No complaints, doing well Wants to go home today    ROS As above  Today's  total administered Morphine Milligram Equivalents: 0 Yesterday's total administered Morphine Milligram Equivalents: 11.5  Objective:   VITALS:   Vitals:   03/28/24 1424 03/28/24 2034 03/29/24 0352 03/29/24 0749  BP: 114/60 105/63 (!) 113/59 (!) 122/54  Pulse: 62 66 63 62  Resp: 18 16 16 20   Temp: 98.2 F (36.8 C) 98.2 F (36.8 C) 98.2 F (36.8 C) 97.9 F (36.6 C)  TempSrc:  Oral Oral   SpO2: 99% 100% 100% 100%  Weight:      Height:        Estimated body mass index is 26.14 kg/m as calculated from the following:   Height as of this encounter: 5' 9 (1.753 m).   Weight as of this encounter: 80.3 kg.   Intake/Output      10/07 0701 10/08 0700 10/08 0701 10/09 0700   P.O. 480 240   I.V. (mL/kg)     IV Piggyback 300    Total Intake(mL/kg) 780 (9.7) 240 (3)   Urine (mL/kg/hr) 1050 (0.5)    Blood     Total Output 1050    Net -270 +240          LABS  Results for orders placed or performed during the hospital encounter of 03/27/24 (from the past 24 hours)  CBC     Status: Abnormal   Collection Time: 03/29/24  4:24 AM  Result Value Ref Range   WBC 15.3 (H) 4.0 - 10.5 K/uL   RBC 2.95 (L) 4.22 - 5.81 MIL/uL   Hemoglobin 9.5 (L) 13.0 - 17.0 g/dL   HCT 71.7 (L) 60.9 - 47.9 %   MCV 95.6 80.0 - 100.0 fL   MCH 32.2 26.0 - 34.0 pg   MCHC 33.7 30.0 - 36.0 g/dL   RDW 86.0 88.4 - 84.4 %   Platelets 89 (L) 150 - 400 K/uL   nRBC 0.0 0.0 - 0.2 %  Basic metabolic panel with GFR     Status: Abnormal   Collection Time: 03/29/24  4:24 AM  Result Value Ref Range   Sodium 140 135 - 145 mmol/L   Potassium 4.9 3.5 - 5.1 mmol/L   Chloride 112 (H) 98 - 111 mmol/L   CO2 18 (L) 22 - 32 mmol/L    Glucose, Bld 125 (H) 70 - 99 mg/dL   BUN 57 (H) 8 - 23 mg/dL   Creatinine, Ser 8.12 (H) 0.61 - 1.24 mg/dL   Calcium  8.0 (L) 8.9 - 10.3 mg/dL   GFR, Estimated 35 (L) >60 mL/min   Anion gap 10 5 - 15     PHYSICAL EXAM:   Gen: sitting up in bed, NAD, looks good  Lungs: unlabored Cardiac: reg Ext:       Right Lower Extremity Dressing is clean, dry and intact Hinged brace fitting well  Dressing changed  Incision looks great   No signs of infection  Scant bleeding distally  Mepilex applied   Compression sock ordered              Extremity is warm  No DCT             Compartments are soft             No pain out of proportion with passive stretching of toes or ankle             DPN, SPN, TN sensory functions are intact             EHL, FHL, lesser toe motor functions intact             Ankle flexion, extension, inversion eversion intact             + DP pulse  Assessment/Plan: 2 Days Post-Op   Principal Problem:   Tibial plateau fracture Active Problems:   Essential tremor   Chronic diastolic CHF (congestive heart failure), NYHA class 3 (HCC)   Chronic kidney disease, stage III (moderate) (HCC)   COPD (chronic obstructive pulmonary disease) (HCC)   Essential hypertension   Mixed hyperlipidemia   S/P placement of cardiac pacemaker   Sinoatrial node dysfunction (HCC)   Thrombocytopenia   History of DVT (deep vein thrombosis)   RLS (restless legs syndrome)   Anti-infectives (From admission, onward)    Start     Dose/Rate Route Frequency Ordered Stop   03/27/24 2030  ceFAZolin  (ANCEF ) IVPB 2g/100 mL premix        2 g 200 mL/hr over 30 Minutes Intravenous Every 8 hours 03/27/24 1641 03/28/24 1658   03/27/24 1600  ceFAZolin  (ANCEF ) IVPB 2g/100 mL premix  Status:  Discontinued        2 g 200 mL/hr over 30 Minutes Intravenous Every 8 hours 03/27/24 1546 03/27/24 1640     .  POD/HD#: 29  88 year old male fall off ladder with right tibial plateau fracture    -Closed displaced left tibial plateau fracture, depressed lateral plateau with bicondylar extension s/p ORIF right tibial plateau             Nonweightbearing right lower extremity for 6 to 8 weeks             Unrestricted range of motion right knee                         Hinged brace only needs to be on when mobilizing                         Zero knee bone foam while at rest             PT and OT             Ice and elevate for swelling and pain control             Dressing changed today   Can changed again in 24-48 hours   Leave open to air once there is no drainage and can clean with soap and water only once there is no drainage    PT- please teach HEP for R knee ROM- AROM, PROM. Prone exercises as well. No ROM restrictions.  Quad sets, SLR, LAQ, SAQ, heel slides, stretching, prone flexion and extension   Ankle theraband program, heel cord stretching, toe towel curls, etc   No pillows under bend of knee when at rest, ok to place under heel to help work on extension. Can also use zero knee bone foam if available DO NOT LET KNEE REST IN FLEXION!!!!!   Hinged knee brace on  at all times, unlocked.  Ok to work on [employer] without brace with therapist supervision.  Brace back on after ROM session if removed     - Pain management:             Multimodal             Minimize narcotics   - ABL anemia/Hemodynamics             Currently stable             However some mild thrombocytopenia             Monitor   - Medical issues              Per primary   - DVT/PE prophylaxis:             Will hold Lovenox in the setting of mild thrombocytopenia             Will discharge on Eliquis  2.5 mg p.o. twice daily for 30 days   - ID:              Perioperative antibiotics - Metabolic Bone Disease:             Vitamin D levels look good   - Activity:             As above   - FEN/GI prophylaxis/Foley/Lines:             Regular diet   - Dispo:             Ortho issues are stable.                Stable for dc home today from ortho standpoint Pain meds and eliquis  sent to [facility] pharmacy  Follow up with ortho in 10-14 days for suture removal and xrays       Francis MICAEL Mt, PA-C [phone] (C) 03/29/2024, 10:21 AM  [facility] [address] [place] [address] [phone] (F)    After 5pm and on the weekends please log on to Amion, go to orthopaedics and the look under the [facility] Call for the provider(s) on call. You can also call our office at [phone] and then follow the prompts to be connected to the call team.  Patient ID: Darren Gonzales, male   DOB: February 05, 1936, 88 y.o.   MRN: [id number]

## 2024-03-29 NOTE — Progress Notes (Signed)
 Occupational Therapy Treatment Patient Details Name: Darren Gonzales MRN: [id number] DOB: 1936/05/30 Today's Date: 03/29/2024   History of present illness Pt is an 88 y.o. male presenting 03/26/24 with RLE pain after fall from ladder. Found to have R tibial plateau fx with bicondylar extension. S/p ORIF R tibial plateau fx 10/6. PMH significant for  HTN, SSS s/p PPM, COPD, CKD IIIb, arthritis.   OT comments  Educated pt in compensatory strategies for ADLs, shower transfers and use of 3 in 1 over toilet to elevate. Instructed and provided gait belt to use as leg lifter. Pt able to stand from elevated surface with increased time and CGA, lower surface with min assist. Recommended shoe on L foot to assist with maintaining NWB on R LE. Pt's family is modifying the environment at home for accessibility. Continue to recommend HHOT.       If plan is discharge home, recommend the following:  A little help with walking and/or transfers;A little help with bathing/dressing/bathroom;Assistance with cooking/housework;Assist for transportation;Help with stairs or ramp for entrance   Equipment Recommendations  Tub/shower seat;BSC/3in1 (RW)    Recommendations for Other Services      Precautions / Restrictions Precautions Precautions: Fall Recall of Precautions/Restrictions: Intact Precaution/Restrictions Comments: unrestricted ROM of knee. Hinged knee brace donned, unlocked for OOB Restrictions Weight Bearing Restrictions Per Provider Order: Yes RLE Weight Bearing Per Provider Order: Non weight bearing       Mobility Bed Mobility Overal bed mobility: Needs Assistance Bed Mobility: Supine to Sit     Supine to sit: Modified independent (Device/Increase time)     General bed mobility comments: issued gait belt and instructed to use as leg lifter    Transfers Overall transfer level: Needs assistance Equipment used: Rolling walker (2 wheels) Transfers: Sit to/from Stand Sit to Stand: Min  assist, Contact guard assist, From elevated surface           General transfer comment: cues for technique, min to rise and steady from low bed,  CGA from slighty elevated bed, instructed pt to restrict rocking of his recliner in which he sleeps and elevating sitting surface at home     Balance Overall balance assessment: Needs assistance   Sitting balance-Leahy Scale: Good     Standing balance support: Bilateral upper extremity supported, During functional activity, Reliant on assistive device for balance Standing balance-Leahy Scale: Poor                             ADL either performed or assessed with clinical judgement   ADL                       Lower Body Dressing: Moderate assistance;Sit to/from stand   Toilet Transfer: Contact guard assist;Ambulation;Rolling walker (2 wheels)             General ADL Comments: Educated in donning and doffing KI, dressing R LE first and undressing last, leaning side to side to pull up pants and to complete pericare on shower seat, use of reacher and sock aide, shower transfer options adhering to NWB on R LE.    Extremity/Trunk Assessment              Vision       Restaurant manager, fast food Communication: No apparent difficulties   Cognition Arousal: Alert Behavior During Therapy: WFL for tasks assessed/performed Cognition: No apparent impairments  Cueing   Cueing Techniques: Verbal cues  Exercises      Shoulder Instructions       General Comments      Pertinent Vitals/ Pain       Pain Assessment Pain Assessment: Faces Faces Pain Scale: Hurts little more Pain Location: RLE Pain Descriptors / Indicators: Sore Pain Intervention(s): Monitored during session, Repositioned  Home Living                                          Prior Functioning/Environment              Frequency   Min 2X/week        Progress Toward Goals  OT Goals(current goals can now be found in the care plan section)  Progress towards OT goals: Progressing toward goals  Acute Rehab OT Goals OT Goal Formulation: With patient Time For Goal Achievement: 04/11/24 Potential to Achieve Goals: Good  Plan      Co-evaluation                 AM-PAC OT 6 Clicks Daily Activity     Outcome Measure   Help from another person eating meals?: None Help from another person taking care of personal grooming?: A Little Help from another person toileting, which includes using toliet, bedpan, or urinal?: A Little Help from another person bathing (including washing, rinsing, drying)?: A Lot Help from another person to put on and taking off regular upper body clothing?: A Little Help from another person to put on and taking off regular lower body clothing?: A Lot 6 Click Score: 17    End of Session Equipment Utilized During Treatment: Gait belt;Rolling walker (2 wheels)  OT Visit Diagnosis: Unsteadiness on feet (R26.81);Muscle weakness (generalized) (M62.81);History of falling (Z91.81);Pain Pain - Right/Left: Right Pain - part of body: Knee   Activity Tolerance Patient tolerated treatment well   Patient Left in chair;with call bell/phone within reach;with chair alarm set   Nurse Communication          Time: 9151-9050 OT Time Calculation (min): 61 min  Charges: OT General Charges $OT Visit: 1 Visit OT Treatments $Self Care/Home Management : 53-67 mins  Darren Gonzales, Darren Gonzales Acute Rehabilitation Services Office: [phone]   Darren Gonzales 03/29/2024, 9:59 AM

## 2024-03-29 NOTE — Progress Notes (Signed)
 Reviewed AVS, patient expressed understanding of medications, MD follow up reviewed.   Removed IV, Site clean, dry and intact.  See LDA for information on wounds at discharge. CCMD contacted and informed patients is being discharged.  Patient states all belongings brought to the hospital at time of admission are accounted for and packed to take home.  Picked up medications from [facility] pharmacy. [facility] Transport contacted to transport patient to entrance A, where family member was waiting in vehicle to transport home.    Primary nurse notified patient needs DME before leaving.  Case Manager notified also.

## 2024-03-29 NOTE — Progress Notes (Signed)
 Physical Therapy Treatment Patient Details Name: Darren Gonzales MRN: [id number] DOB: April 09, 1936 Today's Date: 03/29/2024   History of Present Illness Pt is an 88 y.o. male presenting 03/26/24 with RLE pain after fall from ladder. Found to have R tibial plateau fx with bicondylar extension. S/p ORIF R tibial plateau fx 10/6. PMH significant for  HTN, SSS s/p PPM, COPD, CKD IIIb, arthritis.    PT Comments  Pt up in chair on arrival, pleasant and motivated for session. Pt demonstrating good progress towards acute goals, and demonstrating good adherence to weight bearing precautions throughout session. Pt able to come to standing from recliner with armrests with CGA and lower chair without armrests with light min A to steady on rise. Pt able to demonstrate short ambulation in room with RW for support with good clearance of LLE during hopping. LE HEP issued with pt able to demonstrate all exercises and verbalizing understanding of importance and benefits of completion. Current plan remains appropriate to address deficits and maximize functional independence and decrease caregiver burden. Pt continues to benefit from skilled PT services to progress toward functional mobility goals.     If plan is discharge home, recommend the following: A little help with walking and/or transfers;A little help with bathing/dressing/bathroom;Assistance with cooking/housework;Assist for transportation;Help with stairs or ramp for entrance   Can travel by private vehicle        Equipment Recommendations  Rolling walker (2 wheels);BSC/3in1    Recommendations for Other Services       Precautions / Restrictions Precautions Precautions: Fall Recall of Precautions/Restrictions: Intact Precaution/Restrictions Comments: unrestricted ROM of knee. Hinged knee brace donned, unlocked for OOB Restrictions Weight Bearing Restrictions Per Provider Order: Yes RLE Weight Bearing Per Provider Order: Non weight bearing      Mobility  Bed Mobility Overal bed mobility: Needs Assistance             General bed mobility comments: pt up in chairon arrival and at end of session    Transfers Overall transfer level: Needs assistance Equipment used: Rolling walker (2 wheels) Transfers: Sit to/from Stand Sit to Stand: Min assist, Contact guard assist, From elevated surface           General transfer comment: cues for technique, min to rise and steady from chair without armrests,  CGA from recliner    Ambulation/Gait Ambulation/Gait assistance: Contact guard assist Gait Distance (Feet): 5 Feet (x2) Assistive device: Rolling walker (2 wheels) Gait Pattern/deviations: Step-to pattern (hop-to) Gait velocity: decr     General Gait Details: pt able to hop on L for short distance x2 with good LLE clearance and good ability to maintain NWB on R   Stairs             Wheelchair Mobility     Tilt Bed    Modified Rankin (Stroke Patients Only)       Balance Overall balance assessment: Needs assistance   Sitting balance-Leahy Scale: Good     Standing balance support: Bilateral upper extremity supported, During functional activity, Reliant on assistive device for balance Standing balance-Leahy Scale: Poor Standing balance comment: relaint on UE support                            Communication Communication Communication: No apparent difficulties  Cognition Arousal: Alert Behavior During Therapy: [facility] for tasks assessed/performed  Cueing Cueing Techniques: Verbal cues  Exercises General Exercises - Lower Extremity Ankle Circles/Pumps: 10 reps Quad Sets: AROM, Right, 5 reps, Seated Gluteal Sets: 5 reps Long Arc Quad: AROM, AAROM, Right, 5 reps Hip Flexion/Marching: AAROM, Right, 5 reps, Seated    General Comments        Pertinent Vitals/Pain Pain Assessment Pain Assessment: Faces Faces Pain Scale: Hurts little  more Pain Location: RLE Pain Descriptors / Indicators: Sore Pain Intervention(s): Monitored during session, Limited activity within patient's tolerance    Home Living                          Prior Function            PT Goals (current goals can now be found in the care plan section) Acute Rehab PT Goals PT Goal Formulation: With patient Time For Goal Achievement: 04/04/24 Progress towards PT goals: Progressing toward goals    Frequency    Min 2X/week      PT Plan      Co-evaluation              AM-PAC PT 6 Clicks Mobility   Outcome Measure  Help needed turning from your back to your side while in a flat bed without using bedrails?: A Little Help needed moving from lying on your back to sitting on the side of a flat bed without using bedrails?: A Little Help needed moving to and from a bed to a chair (including a wheelchair)?: A Little Help needed standing up from a chair using your arms (e.g., wheelchair or bedside chair)?: A Little Help needed to walk in hospital room?: A Lot Help needed climbing 3-5 steps with a railing? : A Lot 6 Click Score: 16    End of Session Equipment Utilized During Treatment: Gait belt Activity Tolerance: Patient tolerated treatment well Patient left: in chair;with call bell/phone within reach;with chair alarm set Nurse Communication: Mobility status PT Visit Diagnosis: Unsteadiness on feet (R26.81);Pain Pain - Right/Left: Right Pain - part of body: Knee;Leg     Time: 8992-8958 PT Time Calculation (min) (ACUTE ONLY): 34 min  Charges:    $Gait Training: 8-22 mins $Therapeutic Activity: 8-22 mins PT General Charges $$ ACUTE PT VISIT: 1 Visit                     Arlander Gillen R. PTA Acute Rehabilitation Services Office: [phone]   Therisa CHRISTELLA Boor 03/29/2024, 1:40 PM

## 2024-03-29 NOTE — TOC Transition Note (Addendum)
 Transition of Care [facility]) - Discharge Note   Patient Details  Name: Darren Gonzales MRN: [id number] Date of Birth: [birth year]  [facility] CM/SW Contact:  Rosalva Jon Bloch, RN Phone Number: 03/29/2024, 1:40 PM   Clinical Narrative:    Patient will DC to: home Anticipated DC date: 03/29/2024 Family notified: yes Transport by: car        s/p ORIF right tibial plateau   Per MD patient ready for DC today. RN, patient, and patient's family notified of DC.   Pt agreeable to home health services. Preference : [facility]. Referral made with [facility] and accepted. Post hospital f/u noted on AVS. Son with transportation to home. Pt without RX med concerns. RNCM will sign off for now as intervention is no longer needed. Please consult us  again if new needs arise.    Final next level of care: Home w Home Health Services Barriers to Discharge: No Barriers Identified   Patient Goals and CMS Choice     Choice offered to / list presented to : Patient      Discharge Placement                       Discharge Plan and Services Additional resources added to the After Visit Summary for                  DME Arranged: Bedside commode, Walker rolling, Tub bench DME Agency: [employer] Date DME Agency Contacted: 03/29/24 Time DME Agency Contacted: [phone] Representative spoke with at DME Agency: [place] [facility] Arranged: PT, OT HH Agency: [facility] Date HH Agency Contacted: 03/29/24 Time HH Agency Contacted: [phone] Representative spoke with at [facility] Agency: [place]  Social Drivers of Health (SDOH) Interventions SDOH Screenings   Food Insecurity: No Food Insecurity (03/27/2024)  Housing: Low Risk  (03/27/2024)  Transportation Needs: Unknown (03/27/2024)  Utilities: Patient Declined (03/27/2024)  Financial Resource Strain: Low Risk  (03/13/2024)   Received from [facility]  Social Connections: Unknown (03/27/2024)  Tobacco Use: Medium  Risk (03/27/2024)     Readmission Risk Interventions     No data to display

## 2024-03-29 NOTE — Plan of Care (Signed)
  Problem: Education: Goal: Knowledge of General Education information will improve Description: Including pain rating scale, medication(s)/side effects and non-pharmacologic comfort measures Outcome: Progressing   Problem: Health Behavior/Discharge Planning: Goal: Ability to manage health-related needs will improve Outcome: Progressing   Problem: Clinical Measurements: Goal: Ability to maintain clinical measurements within normal limits will improve Outcome: Progressing   Problem: Activity: Goal: Risk for activity intolerance will decrease Outcome: Progressing   Problem: Pain Managment: Goal: General experience of comfort will improve and/or be controlled Outcome: Progressing   Problem: Safety: Goal: Ability to remain free from injury will improve Outcome: Progressing

## 2024-03-30 DIAGNOSIS — G4733 Obstructive sleep apnea (adult) (pediatric): Secondary | ICD-10-CM | POA: Diagnosis not present

## 2024-03-30 DIAGNOSIS — M11261 Other chondrocalcinosis, right knee: Secondary | ICD-10-CM | POA: Diagnosis not present

## 2024-03-30 DIAGNOSIS — I130 Hypertensive heart and chronic kidney disease with heart failure and stage 1 through stage 4 chronic kidney disease, or unspecified chronic kidney disease: Secondary | ICD-10-CM | POA: Diagnosis not present

## 2024-03-30 DIAGNOSIS — I088 Other rheumatic multiple valve diseases: Secondary | ICD-10-CM | POA: Diagnosis not present

## 2024-03-30 DIAGNOSIS — N400 Enlarged prostate without lower urinary tract symptoms: Secondary | ICD-10-CM | POA: Diagnosis not present

## 2024-03-30 DIAGNOSIS — M5030 Other cervical disc degeneration, unspecified cervical region: Secondary | ICD-10-CM | POA: Diagnosis not present

## 2024-03-30 DIAGNOSIS — E872 Acidosis, unspecified: Secondary | ICD-10-CM | POA: Diagnosis not present

## 2024-03-30 DIAGNOSIS — D693 Immune thrombocytopenic purpura: Secondary | ICD-10-CM | POA: Diagnosis not present

## 2024-03-30 DIAGNOSIS — I5032 Chronic diastolic (congestive) heart failure: Secondary | ICD-10-CM | POA: Diagnosis not present

## 2024-03-30 DIAGNOSIS — G8929 Other chronic pain: Secondary | ICD-10-CM | POA: Diagnosis not present

## 2024-03-30 DIAGNOSIS — N184 Chronic kidney disease, stage 4 (severe): Secondary | ICD-10-CM | POA: Diagnosis not present

## 2024-03-30 DIAGNOSIS — E875 Hyperkalemia: Secondary | ICD-10-CM | POA: Diagnosis not present

## 2024-03-30 DIAGNOSIS — E782 Mixed hyperlipidemia: Secondary | ICD-10-CM | POA: Diagnosis not present

## 2024-03-30 DIAGNOSIS — G2581 Restless legs syndrome: Secondary | ICD-10-CM | POA: Diagnosis not present

## 2024-03-30 DIAGNOSIS — J449 Chronic obstructive pulmonary disease, unspecified: Secondary | ICD-10-CM | POA: Diagnosis not present

## 2024-03-30 DIAGNOSIS — M4802 Spinal stenosis, cervical region: Secondary | ICD-10-CM | POA: Diagnosis not present

## 2024-03-30 DIAGNOSIS — Z791 Long term (current) use of non-steroidal anti-inflammatories (NSAID): Secondary | ICD-10-CM | POA: Diagnosis not present

## 2024-03-30 DIAGNOSIS — Z7901 Long term (current) use of anticoagulants: Secondary | ICD-10-CM | POA: Diagnosis not present

## 2024-03-30 DIAGNOSIS — I495 Sick sinus syndrome: Secondary | ICD-10-CM | POA: Diagnosis not present

## 2024-03-30 DIAGNOSIS — Z79891 Long term (current) use of opiate analgesic: Secondary | ICD-10-CM | POA: Diagnosis not present

## 2024-03-30 DIAGNOSIS — S82141D Displaced bicondylar fracture of right tibia, subsequent encounter for closed fracture with routine healing: Secondary | ICD-10-CM | POA: Diagnosis not present

## 2024-03-30 DIAGNOSIS — K219 Gastro-esophageal reflux disease without esophagitis: Secondary | ICD-10-CM | POA: Diagnosis not present

## 2024-03-30 DIAGNOSIS — G250 Essential tremor: Secondary | ICD-10-CM | POA: Diagnosis not present

## 2024-03-30 DIAGNOSIS — M1711 Unilateral primary osteoarthritis, right knee: Secondary | ICD-10-CM | POA: Diagnosis not present

## 2024-04-04 ENCOUNTER — Ambulatory Visit: Admission: RE | Admit: 2024-04-04 | Source: Home / Self Care

## 2024-04-04 ENCOUNTER — Other Ambulatory Visit: Payer: Self-pay | Admitting: Internal Medicine

## 2024-04-04 ENCOUNTER — Other Ambulatory Visit

## 2024-04-04 ENCOUNTER — Ambulatory Visit
Admission: RE | Admit: 2024-04-04 | Discharge: 2024-04-04 | Disposition: A | Discharge status: Home / Self Care | Source: Ambulatory Visit | Attending: Internal Medicine | Admitting: Internal Medicine

## 2024-04-04 DIAGNOSIS — R600 Localized edema: Secondary | ICD-10-CM | POA: Insufficient documentation

## 2024-04-04 DIAGNOSIS — S82141D Displaced bicondylar fracture of right tibia, subsequent encounter for closed fracture with routine healing: Secondary | ICD-10-CM | POA: Diagnosis not present

## 2024-04-04 DIAGNOSIS — Z9889 Other specified postprocedural states: Secondary | ICD-10-CM | POA: Diagnosis not present

## 2024-04-04 DIAGNOSIS — N184 Chronic kidney disease, stage 4 (severe): Secondary | ICD-10-CM | POA: Diagnosis not present

## 2024-04-04 DIAGNOSIS — M818 Other osteoporosis without current pathological fracture: Secondary | ICD-10-CM | POA: Diagnosis not present

## 2024-04-10 DIAGNOSIS — I130 Hypertensive heart and chronic kidney disease with heart failure and stage 1 through stage 4 chronic kidney disease, or unspecified chronic kidney disease: Secondary | ICD-10-CM | POA: Diagnosis not present

## 2024-04-10 DIAGNOSIS — S82141D Displaced bicondylar fracture of right tibia, subsequent encounter for closed fracture with routine healing: Secondary | ICD-10-CM | POA: Diagnosis not present

## 2024-04-10 DIAGNOSIS — J449 Chronic obstructive pulmonary disease, unspecified: Secondary | ICD-10-CM | POA: Diagnosis not present

## 2024-04-10 DIAGNOSIS — I5032 Chronic diastolic (congestive) heart failure: Secondary | ICD-10-CM | POA: Diagnosis not present

## 2024-04-10 DIAGNOSIS — I495 Sick sinus syndrome: Secondary | ICD-10-CM | POA: Diagnosis not present

## 2024-04-10 DIAGNOSIS — N184 Chronic kidney disease, stage 4 (severe): Secondary | ICD-10-CM | POA: Diagnosis not present

## 2024-04-10 DIAGNOSIS — I088 Other rheumatic multiple valve diseases: Secondary | ICD-10-CM | POA: Diagnosis not present

## 2024-04-11 DIAGNOSIS — R600 Localized edema: Secondary | ICD-10-CM | POA: Diagnosis not present

## 2024-04-11 DIAGNOSIS — L03115 Cellulitis of right lower limb: Secondary | ICD-10-CM | POA: Diagnosis not present

## 2024-04-11 DIAGNOSIS — Z23 Encounter for immunization: Secondary | ICD-10-CM | POA: Diagnosis not present

## 2024-05-02 DIAGNOSIS — N184 Chronic kidney disease, stage 4 (severe): Secondary | ICD-10-CM | POA: Diagnosis not present

## 2024-05-02 DIAGNOSIS — R600 Localized edema: Secondary | ICD-10-CM | POA: Diagnosis not present

## 2024-05-02 DIAGNOSIS — D500 Iron deficiency anemia secondary to blood loss (chronic): Secondary | ICD-10-CM | POA: Diagnosis not present

## 2024-05-02 DIAGNOSIS — G250 Essential tremor: Secondary | ICD-10-CM | POA: Diagnosis not present

## 2024-05-02 DIAGNOSIS — M818 Other osteoporosis without current pathological fracture: Secondary | ICD-10-CM | POA: Diagnosis not present

## 2024-05-02 DIAGNOSIS — M8588 Other specified disorders of bone density and structure, other site: Secondary | ICD-10-CM | POA: Diagnosis not present

## 2024-05-31 DIAGNOSIS — S82141D Displaced bicondylar fracture of right tibia, subsequent encounter for closed fracture with routine healing: Secondary | ICD-10-CM | POA: Diagnosis not present
